# Patient Record
Sex: Female | Born: 1935 | Race: White | Hispanic: No | Marital: Married | Smoking: Former smoker
Health system: Southern US, Community
[De-identification: ages and names within clinical notes are randomized; demographics above are authoritative.]

## PROBLEM LIST (undated history)

## (undated) DIAGNOSIS — I872 Venous insufficiency (chronic) (peripheral): Secondary | ICD-10-CM

## (undated) DIAGNOSIS — R112 Nausea with vomiting, unspecified: Secondary | ICD-10-CM

## (undated) DIAGNOSIS — M353 Polymyalgia rheumatica: Secondary | ICD-10-CM

## (undated) DIAGNOSIS — E119 Type 2 diabetes mellitus without complications: Secondary | ICD-10-CM

## (undated) DIAGNOSIS — H3530 Unspecified macular degeneration: Secondary | ICD-10-CM

## (undated) DIAGNOSIS — E785 Hyperlipidemia, unspecified: Secondary | ICD-10-CM

## (undated) DIAGNOSIS — Z9889 Other specified postprocedural states: Secondary | ICD-10-CM

## (undated) DIAGNOSIS — L97919 Non-pressure chronic ulcer of unspecified part of right lower leg with unspecified severity: Secondary | ICD-10-CM

## (undated) DIAGNOSIS — D496 Neoplasm of unspecified behavior of brain: Secondary | ICD-10-CM

## (undated) DIAGNOSIS — I10 Essential (primary) hypertension: Secondary | ICD-10-CM

## (undated) HISTORY — PX: CERVICAL BIOPSY  W/ LOOP ELECTRODE EXCISION: SUR135

## (undated) HISTORY — DX: Hyperlipidemia, unspecified: E78.5

## (undated) HISTORY — PX: CATARACT EXTRACTION W/ INTRAOCULAR LENS IMPLANT: SHX1309

## (undated) HISTORY — PX: EYE SURGERY: SHX253

## (undated) HISTORY — PX: TONSILLECTOMY: SUR1361

## (undated) HISTORY — DX: Polymyalgia rheumatica: M35.3

## (undated) HISTORY — PX: OTHER SURGICAL HISTORY: SHX169

---

## 1973-07-18 HISTORY — PX: FRACTURE SURGERY: SHX138

## 1998-04-30 ENCOUNTER — Ambulatory Visit (HOSPITAL_COMMUNITY): Admission: RE | Admit: 1998-04-30 | Discharge: 1998-04-30 | Payer: Self-pay | Admitting: Gynecology

## 1999-04-14 ENCOUNTER — Ambulatory Visit (HOSPITAL_COMMUNITY): Admission: RE | Admit: 1999-04-14 | Discharge: 1999-04-14 | Payer: Self-pay | Admitting: Family Medicine

## 1999-04-14 ENCOUNTER — Encounter: Payer: Self-pay | Admitting: Family Medicine

## 2000-01-20 ENCOUNTER — Other Ambulatory Visit: Admission: RE | Admit: 2000-01-20 | Discharge: 2000-01-20 | Payer: Self-pay | Admitting: Gynecology

## 2000-01-20 ENCOUNTER — Encounter (INDEPENDENT_AMBULATORY_CARE_PROVIDER_SITE_OTHER): Payer: Self-pay

## 2000-05-01 ENCOUNTER — Other Ambulatory Visit: Admission: RE | Admit: 2000-05-01 | Discharge: 2000-05-01 | Payer: Self-pay | Admitting: Gynecology

## 2000-12-12 ENCOUNTER — Other Ambulatory Visit: Admission: RE | Admit: 2000-12-12 | Discharge: 2000-12-12 | Payer: Self-pay | Admitting: Gynecology

## 2001-01-17 ENCOUNTER — Ambulatory Visit (HOSPITAL_COMMUNITY): Admission: RE | Admit: 2001-01-17 | Discharge: 2001-01-17 | Payer: Self-pay | Admitting: Gynecology

## 2001-01-17 ENCOUNTER — Encounter (INDEPENDENT_AMBULATORY_CARE_PROVIDER_SITE_OTHER): Payer: Self-pay | Admitting: Specialist

## 2001-03-15 ENCOUNTER — Other Ambulatory Visit: Admission: RE | Admit: 2001-03-15 | Discharge: 2001-03-15 | Payer: Self-pay | Admitting: Gynecology

## 2001-07-18 DIAGNOSIS — M353 Polymyalgia rheumatica: Secondary | ICD-10-CM

## 2001-07-18 HISTORY — DX: Polymyalgia rheumatica: M35.3

## 2002-03-20 ENCOUNTER — Encounter: Payer: Self-pay | Admitting: Family Medicine

## 2002-03-20 ENCOUNTER — Ambulatory Visit (HOSPITAL_COMMUNITY): Admission: RE | Admit: 2002-03-20 | Discharge: 2002-03-20 | Payer: Self-pay | Admitting: Family Medicine

## 2002-03-25 ENCOUNTER — Other Ambulatory Visit: Admission: RE | Admit: 2002-03-25 | Discharge: 2002-03-25 | Payer: Self-pay | Admitting: Gynecology

## 2002-05-07 ENCOUNTER — Encounter: Payer: Self-pay | Admitting: Family Medicine

## 2002-05-07 ENCOUNTER — Ambulatory Visit (HOSPITAL_COMMUNITY): Admission: RE | Admit: 2002-05-07 | Discharge: 2002-05-07 | Payer: Self-pay | Admitting: Family Medicine

## 2002-11-27 ENCOUNTER — Ambulatory Visit (HOSPITAL_COMMUNITY): Admission: RE | Admit: 2002-11-27 | Discharge: 2002-11-27 | Payer: Self-pay

## 2003-05-05 ENCOUNTER — Encounter: Admission: RE | Admit: 2003-05-05 | Discharge: 2003-08-03 | Payer: Self-pay | Admitting: Family Medicine

## 2003-12-22 ENCOUNTER — Other Ambulatory Visit: Admission: RE | Admit: 2003-12-22 | Discharge: 2003-12-22 | Payer: Self-pay | Admitting: Gynecology

## 2004-04-07 ENCOUNTER — Encounter: Admission: RE | Admit: 2004-04-07 | Discharge: 2004-04-07 | Payer: Self-pay | Admitting: Family Medicine

## 2004-06-28 ENCOUNTER — Other Ambulatory Visit: Admission: RE | Admit: 2004-06-28 | Discharge: 2004-06-28 | Payer: Self-pay | Admitting: Gynecology

## 2005-01-24 ENCOUNTER — Other Ambulatory Visit: Admission: RE | Admit: 2005-01-24 | Discharge: 2005-01-24 | Payer: Self-pay | Admitting: Gynecology

## 2006-10-23 ENCOUNTER — Encounter (INDEPENDENT_AMBULATORY_CARE_PROVIDER_SITE_OTHER): Payer: Self-pay | Admitting: *Deleted

## 2006-10-23 ENCOUNTER — Ambulatory Visit (HOSPITAL_BASED_OUTPATIENT_CLINIC_OR_DEPARTMENT_OTHER): Admission: RE | Admit: 2006-10-23 | Discharge: 2006-10-23 | Payer: Self-pay | Admitting: Urology

## 2007-08-23 ENCOUNTER — Other Ambulatory Visit: Admission: RE | Admit: 2007-08-23 | Discharge: 2007-08-23 | Payer: Self-pay | Admitting: Family Medicine

## 2007-11-07 ENCOUNTER — Encounter: Admission: RE | Admit: 2007-11-07 | Discharge: 2007-11-07 | Payer: Self-pay

## 2009-03-27 ENCOUNTER — Encounter: Admission: RE | Admit: 2009-03-27 | Discharge: 2009-03-27 | Payer: Self-pay | Admitting: Family Medicine

## 2009-04-03 ENCOUNTER — Encounter: Admission: RE | Admit: 2009-04-03 | Discharge: 2009-04-03 | Payer: Self-pay | Admitting: Orthopaedic Surgery

## 2010-01-04 ENCOUNTER — Encounter (HOSPITAL_BASED_OUTPATIENT_CLINIC_OR_DEPARTMENT_OTHER): Admission: RE | Admit: 2010-01-04 | Discharge: 2010-04-04 | Payer: Self-pay | Admitting: General Surgery

## 2010-01-05 ENCOUNTER — Ambulatory Visit (HOSPITAL_COMMUNITY): Admission: RE | Admit: 2010-01-05 | Discharge: 2010-01-05 | Payer: Self-pay | Admitting: General Surgery

## 2010-01-21 ENCOUNTER — Ambulatory Visit: Payer: Self-pay | Admitting: Vascular Surgery

## 2010-04-05 ENCOUNTER — Encounter (HOSPITAL_BASED_OUTPATIENT_CLINIC_OR_DEPARTMENT_OTHER): Admission: RE | Admit: 2010-04-05 | Discharge: 2010-05-25 | Payer: Self-pay | Admitting: General Surgery

## 2010-11-30 NOTE — Procedures (Signed)
DUPLEX DEEP VENOUS EXAM - LOWER EXTREMITY   INDICATION:  Nonhealing wound.   HISTORY:  Edema:  Yes.  Trauma/Surgery:  Yes.  Pain:  No.  PE:  No.  Previous DVT:  No.  Anticoagulants:  No.  Other:   DUPLEX EXAM:                CFV   SFV   PopV  PTV    GSV                R  L  R  L  R  L  R   L  R  L  Thrombosis    0  0  0  0  0  0  0   0  0  0  Spontaneous   +  +  +  +  +  +  +   +  +  +  Phasic        +  +  +  +  +  +  +   +  +  +  Augmentation  +  +  +  +  +  +  +   +  +  +  Compressible  +  +  +  +  +  +  +   +  +  +  Competent     +  +  +  +  +  +  +   +  +  +   Legend:  + - yes  o - no  p - partial  D - decreased   IMPRESSION:  1. Bilateral lower extremities appear to be negative for deep venous      thrombosis.  2. Bilateral lower extremity deep and superficial veins appear to be      negative for venous reflux.    _____________________________  Di Kindle. Edilia Bo, M.D.   NT/MEDQ  D:  01/21/2010  T:  01/21/2010  Job:  [id number]

## 2010-12-03 NOTE — Op Note (Signed)
NAME:  Nancy Glover, Nancy Glover                ACCOUNT NO.:  [id number]   MEDICAL RECORD NO.:  [id number]          PATIENT TYPE:  AMB   LOCATION:  [facility]                         FACILITY:  [facility]   PHYSICIAN:  Maretta Bees. Vonita Moss, M.D.DATE OF BIRTH:  1933/07/20   DATE OF PROCEDURE:  10/23/2006  DATE OF DISCHARGE:                               OPERATIVE REPORT   PREOPERATIVE DIAGNOSIS:  Rule out interstitial cystitis.   POSTOPERATIVE DIAGNOSIS:  Rule out interstitial cystitis.   PROCEDURE:  Cystoscopy, HOD and cold cup bladder biopsy.   SURGEON:  Dr. Larey Dresser.   ANESTHESIA:  General.   INDICATIONS:  This 77 year old lady has had a long history of voiding  symptoms with chronic frequency and urgency and urge incontinence.  She  also has a history of recurrent UTIs despite therapy with Sanctura,  Estrace cream and trimethoprim she still very symptomatic with the  pelvic pain symptom score of 22.  She is brought to the OR today to rule  out interstitial cystitis.   PROCEDURE:  The patient is brought to the operating room, placed in  lithotomy position.  External genitalia were prepped, draped in usual  fashion.  She was cystoscoped.  The bladder was unremarkable with no  stones, tumors or inflammatory lesions.  She then underwent  hydrodistention of the bladder and was filled up to 600 mL and started  leaking around the cystoscope.  Looking back in, she had widespread  scattered submucosal petechiae and hemorrhage consistent with IC.  Cold  cup bladder biopsies were taken across the base and typical hemorrhagic  areas and the biopsy sites were fulgurated with the Bugbee electrode.  Bladder was emptied, scope removed.  The patient sent to recovery room  in good condition having tolerated the procedure well.      Maretta Bees. Vonita Moss, M.D.  Electronically Signed     LJP/MEDQ  D:  10/23/2006  T:  10/23/2006  Job:  [id number]   cc:   Dario Guardian, M.D.  Fax: [phone]

## 2010-12-03 NOTE — Op Note (Signed)
[facility]  Patient:    Nancy Glover, Nancy Glover                         MRN: [id number] Attending:  Gretta Cool, M.D. CC:         Stacie Acres. Cliffton Asters, M.D.   Operative Report  PREOPERATIVE DIAGNOSIS:  Abnormal genital cytology, persistent x 2 years, low-grade.  POSTOPERATIVE DIAGNOSIS:  Abnormal genital cytology, persistent x 2 years, low-grade.  PROCEDURE:  Loop electrosurgical excision procedure (LEEP) cone.  SURGEON:  Gretta Cool, M.D.  ANESTHESIA:  Paracervical block 1% Xylocaine with preoperative Vioxx 25 mg.  DESCRIPTION OF PROCEDURE:  Under excellent anesthesia as above with the patient prepped and draped in lithotomy position, the cervix was first anesthetized with Xylocaine as a paracervical block.  The cervix was then prepped with Lugols iodine to identify possible skip lesions.  There were no skip lesions.  A central core excision of the transformation zone was then undertaken and extended approximately 2 cm down the canal of the cervix.  Once the entire transformation zone extending approximately 2 cm down the canal of the cervix was obtained, the endocervical tissue deep to the cone was then cauterized.  Bleeding was controlled with monopolar cautery.  The procedure was then terminated without complication.  Patient returned to the recovery room in satisfactory condition. DD:  01/17/01 TD:  01/17/01 Job: [id number]

## 2012-01-04 ENCOUNTER — Emergency Department (HOSPITAL_COMMUNITY): Payer: Medicare Other

## 2012-01-04 ENCOUNTER — Encounter (HOSPITAL_COMMUNITY): Payer: Self-pay | Admitting: Emergency Medicine

## 2012-01-04 DIAGNOSIS — S0003XA Contusion of scalp, initial encounter: Secondary | ICD-10-CM | POA: Insufficient documentation

## 2012-01-04 DIAGNOSIS — I10 Essential (primary) hypertension: Secondary | ICD-10-CM | POA: Insufficient documentation

## 2012-01-04 DIAGNOSIS — S0083XA Contusion of other part of head, initial encounter: Secondary | ICD-10-CM | POA: Insufficient documentation

## 2012-01-04 DIAGNOSIS — R071 Chest pain on breathing: Secondary | ICD-10-CM | POA: Insufficient documentation

## 2012-01-04 DIAGNOSIS — W108XXA Fall (on) (from) other stairs and steps, initial encounter: Secondary | ICD-10-CM | POA: Insufficient documentation

## 2012-01-04 DIAGNOSIS — E119 Type 2 diabetes mellitus without complications: Secondary | ICD-10-CM | POA: Insufficient documentation

## 2012-01-04 DIAGNOSIS — S20219A Contusion of unspecified front wall of thorax, initial encounter: Secondary | ICD-10-CM | POA: Insufficient documentation

## 2012-01-04 NOTE — ED Notes (Signed)
PT. LOST HER BALANCED WHILE SWEEPING HER PORCH THIS EVENING , FELL BACK WARDS , NO LOC , AMBULATORY . REPORTS PAIN AT BACK OF HEAD AND RIGHT LATERAL RIBCAGE PAIN , RESPIRATIONS UNLABORED.

## 2012-01-05 ENCOUNTER — Emergency Department (HOSPITAL_COMMUNITY): Payer: Medicare Other

## 2012-01-05 ENCOUNTER — Emergency Department (HOSPITAL_COMMUNITY)
Admission: EM | Admit: 2012-01-05 | Discharge: 2012-01-05 | Disposition: A | Discharge status: Home / Self Care | Payer: Medicare Other | Attending: Emergency Medicine | Admitting: Emergency Medicine

## 2012-01-05 DIAGNOSIS — S20219A Contusion of unspecified front wall of thorax, initial encounter: Secondary | ICD-10-CM

## 2012-01-05 DIAGNOSIS — W19XXXA Unspecified fall, initial encounter: Secondary | ICD-10-CM

## 2012-01-05 DIAGNOSIS — S0003XA Contusion of scalp, initial encounter: Secondary | ICD-10-CM

## 2012-01-05 HISTORY — DX: Essential (primary) hypertension: I10

## 2012-01-05 MED ORDER — OXYCODONE-ACETAMINOPHEN 5-325 MG PO TABS: 1.0000 | ORAL_TABLET | ORAL | Status: AC | PRN

## 2012-01-05 NOTE — Discharge Instructions (Signed)
Take Tylenol or ibuprofen as needed for less severe pain.  Chest Contusion You have been checked for injuries to your chest. Your caregiver has not found injuries serious enough to require hospitalization. It is common to have bruises and sore muscles after an injury. These tend to feel worse the first 24 hours. You may gradually develop more stiffness and soreness over the next several hours to several days. This usually feels worse the first morning following your injury. After a few days, you will usually begin to improve. The amount of improvement depends on the amount of damage. Following the accident, if the pain in any area continues to increase or you develop new areas of pain, you should see your primary caregiver or return to the Emergency Department for re-evaluation. HOME CARE INSTRUCTIONS   Put ice on sore areas every 2 hours for 20 minutes while awake for the next 2 days.   Drink extra fluids. Do not drink alcohol.   Activity as tolerated. Lifting may make pain worse.   Only take over-the-counter or prescription medicines for pain, discomfort, or fever as directed by your caregiver. Do not use aspirin. This may increase bruising or increase bleeding.  SEEK IMMEDIATE MEDICAL CARE IF:   There is a worsening of any of the problems that brought you in for care.   Shortness of breath, dizziness or fainting develop.   You have chest pain, difficulty breathing, or develop pain going down the left arm or up into jaw.   You feel sick to your stomach (nausea), vomiting or sweats.   You have increasing belly (abdominal) discomfort.   There is blood in your urine, stool, or if you vomit blood.   There is pain in either shoulder in an area where a shoulder strap would be.   You have feelings of lightheadedness, or if you should have a fainting episode.   You have numbness, tingling, weakness, or problems with the use of your arms or legs.   Severe headaches not relieved with  medications develop.   You have a change in bowel or bladder control.   There is increasing pain in any areas of the body.  If you feel your symptoms are worsening, and you are not able to see your primary caregiver, return to the Emergency Department immediately. MAKE SURE YOU:   Understand these instructions.   Will watch your condition.   Will get help right away if you are not doing well or get worse.  Document Released: 03/29/2001 Document Revised: 06/23/2011 Document Reviewed: 02/20/2008 [facility] Patient Information 2012 [place], [place].  Home Safety and Preventing Falls Falls are a leading cause of injury and while they affect all age groups, falls have greater short-term and long-term impact on older age groups. However, falls should not be a part of life or aging. It is possible for individuals and their families to use preventive measures to significantly decrease the likelihood that anyone, especially an older adult, will fall. There are many simple measures which can make your home safer with respect to preventing falls. The following actions can help reduce falls among all members of your family and are especially important as you age, when your balance, lower limb strength, coordination, and eyesight may be declining. The use of preventive measures will help to reduce you and your family's risk of falls and serious medical consequences. OUTDOORS  Repair cracks and edges of walkways and driveways.   Remove high doorway thresholds and trim shrubbery on the main path  into your home.   Ensure there is good outside lighting at main entrances and along main walkways.   Clear walkways of tools, rocks, debris, and clutter.   Check that handrails are not broken and are securely fastened. Both sides of steps should have handrails.   In the garage, be attentive to and clean up grease or oil spills on the cement. This can make the surface extremely slippery.   In winter, have  leaves, snow, and ice cleared regularly.   Use sand or salt on walkways during winter months.  BATHROOM  Install grab bars by the toilet and in the tub and shower.   Use non-skid mats or decals in the tub or shower.   If unable to easily stand unsupported while showering, place a plastic non slip stool in the shower to sit on when needed.   Install night lights.   Keep floors dry and clean up all water on the floor immediately.   Remove soap buildup in tub or shower on a regular basis.   Secure bath mats with non-slip, double-sided rug tape.   Remove tripping hazards from the floors.  BEDROOMS  Install night lights.   Do not use oversized bedding.   Make sure a bedside light is easy to reach.   Keep a telephone by your bedside.   Make sure that you can get in and out of your bed easily.   Have a firm chair, with side arms, to use for getting dressed.   Remove clutter from around closets.   Store clothing, bed coverings, and other household items where you can reach them comfortably.   Remove tripping hazards from the floor.  LIVING AREAS AND STAIRWAYS  Turn on lights to avoid having to walk through dark areas.   Keep lighting uniform in each room. Place brighter lightbulbs in darker areas, including stairways.   Replace lightbulbs that burn out in stairways immediately.   Arrange furniture to provide for clear pathways.   Keep furniture in the same place.   Eliminate or tape down electrical cables in high traffic areas.   Place handrails on both sides of stairways. Use handrails when going up or down stairs.   Most falls occur on the top or bottom 3 steps.   Fix any loose handrails. Make sure handrails on both sides of the stairways are as long as the stairs.   Remove all walkway obstacles.   Coil or tape electrical cords off to the side of walking areas and out of the way. If using many extension cords, have an electrician put in a new wall outlet to  reduce or eliminate them.   Make sure spills are cleaned up quickly and allow time for drying before walking on freshly cleaned floors.   Firmly attach carpet with non-skid or two-sided tape.   Keep frequently used items within easy reach.   Remove tripping hazards such as throw rugs and clutter in walkways. Never leave objects on stairs.   Get rid of throw rugs elsewhere if possible.   Eliminate uneven floor surfaces.   Make sure couches and chairs are easy to get into and out of.   Check carpeting to make sure it is firmly attached along stairs.   Make repairs to worn or loose carpet promptly.   Select a carpet pattern that does not visually hide the edge of steps.   Avoid placing throw rugs or scatter rugs at the top or bottom of stairways, or properly secure  with carpet tape to prevent slippage.   Have an electrician put in a light switch at the top and bottom of the stairs.   Get light switches that glow.   Avoid the following practices: hurrying, inattention, obscured vision, carrying large loads, and wearing slip-on shoes.   Be aware of all pets.  KITCHEN  Place items that are used frequently, such as dishes and food, within easy reach.   Keep handles on pots and pans toward the center of the stove. Use back burners when possible.   Make sure spills are cleaned up quickly and allow time for drying.   Avoid walking on wet floors.   Avoid hot utensils and knives.   Position shelves so they are not too high or low.   Place commonly used objects within easy reach.   If necessary, use a sturdy step stool with a grab bar when reaching.   Make sure electrical cables are out of the way.   Do not use floor polish or wax that makes floors slippery.  OTHER HOME FALL PREVENTION STRATEGIES  Wear low heel or rubber sole shoes that are supportive and fit well.   Wear closed toe shoes.   Know and watch for side effects of medications. Have your caregiver or pharmacist  look at all your medicines, even over-the-counter medicines. Some medicines can make you sleepy or dizzy.   Exercise regularly. Exercise makes you stronger and improves your balance and coordination.   Limit use of alcohol.   Use eyeglasses if necessary and keep them clean. Have your vision checked every year.   Organize your household in a manner that minimizes the need to walk distances when hurried, or go up and down stairs unnecessarily. For example, have a phone placed on at least each floor of your home. If possible, have a phone beside each sitting or lying area where you spend the most time at home. Keep emergency numbers posted at all phones.   Use non-skid floor wax.   When using a ladder, make sure:   The base is firm.   All ladder feet are on level ground.   The ladder is angled against the wall properly.   When climbing a ladder, face the ladder and hold the ladder rungs firmly.   If reaching, always keep your hips and body weight centered between the rails.   When using a stepladder, make sure it is fully opened and both spreaders are firmly locked.   Do not climb a closed stepladder.   Avoid climbing beyond the second step from the top of a stepladder and the 4th rung from the top of an extension ladder.   Learn and use mobility aids as needed.   Change positions slowly. Arise slowly from sitting and lying positions. Sit on the edge of your bed before getting to your feet.   If you have a history of falls, ask someone to add color or contrast paint or tape to grab bars and handrails in your home.   If you have a history of falls, ask someone to place contrasting color strips on first and last steps.   Install an electrical emergency response system if you need one, and know how to use it.   If you have a medical or other condition that causes you to have limited physical strength, it is important that you reach out to family and friends for occasional help.    FOR CHILDREN:  If young children are in the  home, use safety gates. At the top of stairs use screw-mounted gates; use pressure-mounted gates for the bottom of the stairs and doorways between rooms.   Young children should be taught to descend stairs on their stomachs, feet first, and later using the handrail.   Keep drawers fully closed to prevent them from being climbed on or pulled out entirely.   Move chairs, cribs, beds and other furniture away from windows.   Consider installing window guards on windows ground floor and up, unless they are emergency fire exits. Make sure they have easy release mechanisms.   Consider installing special locks that only allow the window to be opened to a certain height.   Never rely on window screens to prevent falls.   Never leave babies alone on changing tables, beds or sofas. Use a changing table that has a restraining strap.   When a child can pull to a standing position, the crib mattress should be adjusted to its lowest position. There should be at least 26 inches between the top rails of the crib drop side and the mattress. Toys, bumper pads, and other objects that can be used as steps to climb out should be removed from the crib.   On bunk beds never allow a child under age 27 to sleep on the top bunk. For older children, if the upper bunk is not against a wall, use guard rails on both sides. No matter how old a child is, keep the guard rails in place on the top bunk since children roll during sleep. Do not permit horseplay on bunks.   Grass and soil surfaces beneath backyard playground equipment should be replaced with hardwood chips, shredded wood mulch, sand, pea gravel, rubber, crushed stone, or another safer material at depths of at least 9 to 12 inches.   When riding bikes or using skates, skateboards, skis, or snowboards, require children to wear helmets. Look for those that have stickers stating that they meet or exceed safety standards.    Vertical posts or pickets in deck, balcony, and stairway railings should be no more than 3 1/2 inches apart if a young baby will have access to the area. The space between horizontal rails or bars, and between the floor and the first horizontal rail or bar, should be no more than 3 1/2 inches.  Document Released: 06/24/2002 Document Revised: 06/23/2011 Document Reviewed: 04/23/2009 [facility] Patient Information 2012 [place], [place].  Acetaminophen; Oxycodone tablets What is this medicine? ACETAMINOPHEN; OXYCODONE (a set a MEE noe fen; ox i KOE done) is a pain reliever. It is used to treat mild to moderate pain. This medicine may be used for other purposes; ask your health care provider or pharmacist if you have questions. What should I tell my health care provider before I take this medicine? They need to know if you have any of these conditions: -brain tumor -Crohn's disease, inflammatory bowel disease, or ulcerative colitis -drink more than 3 alcohol containing drinks per day -drug abuse or addiction -head injury -heart or circulation problems -kidney disease or problems going to the bathroom -liver disease -lung disease, asthma, or breathing problems -an unusual or allergic reaction to acetaminophen, oxycodone, other opioid analgesics, other medicines, foods, dyes, or preservatives -pregnant or trying to get pregnant -breast-feeding How should I use this medicine? Take this medicine by mouth with a full glass of water. Follow the directions on the prescription label. Take your medicine at regular intervals. Do not take your medicine more often than directed.  Talk to your pediatrician regarding the use of this medicine in children. Special care may be needed. Patients over 39 years old may have a stronger reaction and need a smaller dose. Overdosage: If you think you have taken too much of this medicine contact a poison control center or emergency room at once. NOTE: This medicine is  only for you. Do not share this medicine with others. What if I miss a dose? If you miss a dose, take it as soon as you can. If it is almost time for your next dose, take only that dose. Do not take double or extra doses. What may interact with this medicine? -alcohol or medicines that contain alcohol -antihistamines -barbiturates like amobarbital, butalbital, butabarbital, methohexital, pentobarbital, phenobarbital, thiopental, and secobarbital -benztropine -drugs for bladder problems like solifenacin, trospium, oxybutynin, tolterodine, hyoscyamine, and methscopolamine -drugs for breathing problems like ipratropium and tiotropium -drugs for certain stomach or intestine problems like propantheline, homatropine methylbromide, glycopyrrolate, atropine, belladonna, and dicyclomine -general anesthetics like etomidate, ketamine, nitrous oxide, propofol, desflurane, enflurane, halothane, isoflurane, and sevoflurane -medicines for depression, anxiety, or psychotic disturbances -medicines for pain like codeine, morphine, pentazocine, buprenorphine, butorphanol, nalbuphine, tramadol, and propoxyphene -medicines for sleep -muscle relaxants -naltrexone -phenothiazines like perphenazine, thioridazine, chlorpromazine, mesoridazine, fluphenazine, prochlorperazine, promazine, and trifluoperazine -scopolamine -trihexyphenidyl This list may not describe all possible interactions. Give your health care provider a list of all the medicines, herbs, non-prescription drugs, or dietary supplements you use. Also tell them if you smoke, drink alcohol, or use illegal drugs. Some items may interact with your medicine. What should I watch for while using this medicine? Tell your doctor or health care professional if your pain does not go away, if it gets worse, or if you have new or a different type of pain. You may develop tolerance to the medicine. Tolerance means that you will need a higher dose of the medication for  pain relief. Tolerance is normal and is expected if you take this medicine for a long time. Do not suddenly stop taking your medicine because you may develop a severe reaction. Your body becomes used to the medicine. This does NOT mean you are addicted. Addiction is a behavior related to getting and using a drug for a nonmedical reason. If you have pain, you have a medical reason to take pain medicine. Your doctor will tell you how much medicine to take. If your doctor wants you to stop the medicine, the dose will be slowly lowered over time to avoid any side effects. You may get drowsy or dizzy. Do not drive, use machinery, or do anything that needs mental alertness until you know how this medicine affects you. Do not stand or sit up quickly, especially if you are an older patient. This reduces the risk of dizzy or fainting spells. Alcohol may interfere with the effect of this medicine. Avoid alcoholic drinks. The medicine will cause constipation. Try to have a bowel movement at least every 2 to 3 days. If you do not have a bowel movement for 3 days, call your doctor or health care professional. Do not take Tylenol (acetaminophen) or medicines that have acetaminophen with this medicine. Too much acetaminophen can be very dangerous. Many nonprescription medicines contain acetaminophen. Always read the labels carefully to avoid taking more acetaminophen. What side effects may I notice from receiving this medicine? Side effects that you should report to your doctor or health care professional as soon as possible: -allergic reactions like skin rash, itching or hives, swelling of  the face, lips, or tongue -breathing difficulties, wheezing -confusion -light headedness or fainting spells -severe stomach pain -yellowing of the skin or the whites of the eyes Side effects that usually do not require medical attention (report to your doctor or health care professional if they continue or are  bothersome): -dizziness -drowsiness -nausea -vomiting This list may not describe all possible side effects. Call your doctor for medical advice about side effects. You may report side effects to FDA at 1-800-FDA-1088. Where should I keep my medicine? Keep out of the reach of children. This medicine can be abused. Keep your medicine in a safe place to protect it from theft. Do not share this medicine with anyone. Selling or giving away this medicine is dangerous and against the law. Store at room temperature between 20 and 25 degrees C (68 and 77 degrees F). Keep container tightly closed. Protect from light. Flush any unused medicines down the toilet. Do not use the medicine after the expiration date. NOTE: This sheet is a summary. It may not cover all possible information. If you have questions about this medicine, talk to your doctor, pharmacist, or health care provider.  2012, [employer]. (06/02/2008 10:01:21 AM)

## 2012-01-05 NOTE — ED Notes (Signed)
Patient has returned from radiology.  

## 2012-01-05 NOTE — ED Provider Notes (Signed)
History     CSN: [id number]  Arrival date & time 01/04/12  2041   First MD Initiated Contact with Patient 01/05/12 0136      Chief Complaint  Patient presents with  . Fall    (Consider location/radiation/quality/duration/timing/severity/associated sxs/prior treatment) Patient is a 78 y.o. female presenting with fall. The history is provided by the patient.  Fall  She fell down some steps and hit her head on concrete. She also hit her right chest wall. She's complaining of pain in her right chest wall and also is complaining of a knot on the back of her head. She says she saw stars but did not lose consciousness. She denies vision disturbance, nausea, vomiting, dizziness, incoordination. Pain is moderately severe and she rates it at 7/10. She denies neck, back, abdomen, extremity injury.  Past Medical History  Diagnosis Date  . Hypertension   . Diabetes mellitus     History reviewed. No pertinent past surgical history.  No family history on file.  History  Substance Use Topics  . Smoking status: Never Smoker   . Smokeless tobacco: Not on file  . Alcohol Use: No    OB History    Grav Para Term Preterm Abortions TAB SAB Ect Mult Living                  Review of Systems  All other systems reviewed and are negative.    Allergies  Review of patient's allergies indicates not on file.  Home Medications  No current outpatient prescriptions on file.  BP 131/80  Pulse 97  Temp 98.1 F (36.7 C) (Oral)  Resp 20  SpO2 94%  Physical Exam  Nursing note and vitals reviewed.  78 year old female who is resting comfortably and in no acute distress. Vital signs are significant for a systolic hypertension with blood pressure 167/73. Oxygen saturation is 96% which is normal. Head is normocephalic with a 2 cm hematoma over the occiput. This area is moderately tender.Marland Kitchen PERRLA, EOMI. TMs are clear without CSF otorrhea or hemotympanum. Neck has mild midline tenderness. Back is  nontender. Lungs are clear without rales, wheezes, or rhonchi. There is moderate right-sided chest wall tenderness without any deformity. Heart has regular rate and rhythm without murmur. Abdomen is soft, flat, nontender without masses or hepatosplenomegaly. Extremities have full range of motion, no cyanosis. There is 1-2+ pretibial edema and moderate venous stasis changes are present. Skin is warm and dry without other rash. Neurologic: Mental status is normal, cranial nerves are intact, there are no motor or sensory deficits.  ED Course  Procedures (including critical care time)  Dg Ribs Unilateral W/chest Right  01/04/2012  *RADIOLOGY REPORT*  Clinical Data: Right lower rib pain following a fall tonight.  Ex- smoker.  RIGHT RIBS AND CHEST - 3+ VIEW  Comparison: Chest dated 01/05/2010.  Findings: The cardiac silhouette remains borderline enlarged. Multiple old, healed right rib fractures.  No acute fracture visualized and no pneumothorax seen.  Stable prominence of the interstitial markings.  IMPRESSION:  1.  No acute fracture or pneumothorax. 2.  Multiple old, healed right rib fractures. 3.  Stable borderline cardiomegaly and mild changes of COPD.  Original Report Authenticated By: Darrol Angel, M.D.   Ct Head Wo Contrast  01/05/2012  *RADIOLOGY REPORT*  Clinical Data:  Status post fall; loss of balance.  Hit back of head.  Concern for cervical spine injury.  CT HEAD WITHOUT CONTRAST AND CT CERVICAL SPINE WITHOUT CONTRAST  Technique:  Multidetector  CT imaging of the head and cervical spine was performed following the standard protocol without intravenous contrast.  Multiplanar CT image reconstructions of the cervical spine were also generated.  Comparison: None  CT HEAD  Findings: There is no evidence of acute infarction, mass lesion, or intra- or extra-axial hemorrhage on CT.  Scattered periventricular and subcortical white matter change likely reflects small vessel ischemic microangiopathy.  Chronic  ischemic change is noted at the basal ganglia bilaterally.  The posterior fossa, including the cerebellum, brainstem and fourth ventricle, is within normal limits.  The third and lateral ventricles are unremarkable in appearance.  The cerebral hemispheres demonstrate grossly normal gray-white differentiation. No mass effect or midline shift is seen.  There is no evidence of fracture; visualized osseous structures are unremarkable in appearance.  The orbits are within normal limits. A small 9 mm mucus retention cyst or polyp is noted within the left ethmoid air cells.  The paranasal sinuses and mastoid air cells are well-aerated.  Soft tissue swelling is noted at the posterior vertex.  IMPRESSION:  1.  No evidence of traumatic intracranial injury or fracture. 2.  Soft tissue swelling at the posterior vertex. 3.  Scattered small vessel ischemic microangiopathy, with chronic ischemic change at the basal ganglia bilaterally. 4.  9 mm mucus retention cyst or polyp within the left ethmoid air cells.  CT CERVICAL SPINE  Findings: There is no evidence of fracture or subluxation. Vertebral bodies demonstrate normal height and alignment.  There is narrowing of the intervertebral disc space at C5-C6, with associated anterior and posterior disc osteophyte complexes. Prevertebral soft tissues are within normal limits.  A small hypodensity at the left thyroid lobe likely remains within normal limits, given its size.  The visualized lung apices are clear.  Scattered calcification is noted at the carotid bifurcations bilaterally.  IMPRESSION:  1.  No evidence of fracture or subluxation along the cervical spine. 2.  Mild degenerative change at the lower cervical spine. 3.  Scattered calcification at the carotid bifurcations bilaterally.  Carotid ultrasound would be helpful for further evaluation on an elective non-emergent basis, when and as deemed clinically appropriate.  Original Report Authenticated By: Tonia Ghent, M.D.   Ct  Cervical Spine Wo Contrast  01/05/2012  *RADIOLOGY REPORT*  Clinical Data:  Status post fall; loss of balance.  Hit back of head.  Concern for cervical spine injury.  CT HEAD WITHOUT CONTRAST AND CT CERVICAL SPINE WITHOUT CONTRAST  Technique:  Multidetector CT imaging of the head and cervical spine was performed following the standard protocol without intravenous contrast.  Multiplanar CT image reconstructions of the cervical spine were also generated.  Comparison: None  CT HEAD  Findings: There is no evidence of acute infarction, mass lesion, or intra- or extra-axial hemorrhage on CT.  Scattered periventricular and subcortical white matter change likely reflects small vessel ischemic microangiopathy.  Chronic ischemic change is noted at the basal ganglia bilaterally.  The posterior fossa, including the cerebellum, brainstem and fourth ventricle, is within normal limits.  The third and lateral ventricles are unremarkable in appearance.  The cerebral hemispheres demonstrate grossly normal gray-white differentiation. No mass effect or midline shift is seen.  There is no evidence of fracture; visualized osseous structures are unremarkable in appearance.  The orbits are within normal limits. A small 9 mm mucus retention cyst or polyp is noted within the left ethmoid air cells.  The paranasal sinuses and mastoid air cells are well-aerated.  Soft tissue swelling is noted at the  posterior vertex.  IMPRESSION:  1.  No evidence of traumatic intracranial injury or fracture. 2.  Soft tissue swelling at the posterior vertex. 3.  Scattered small vessel ischemic microangiopathy, with chronic ischemic change at the basal ganglia bilaterally. 4.  9 mm mucus retention cyst or polyp within the left ethmoid air cells.  CT CERVICAL SPINE  Findings: There is no evidence of fracture or subluxation. Vertebral bodies demonstrate normal height and alignment.  There is narrowing of the intervertebral disc space at C5-C6, with associated  anterior and posterior disc osteophyte complexes. Prevertebral soft tissues are within normal limits.  A small hypodensity at the left thyroid lobe likely remains within normal limits, given its size.  The visualized lung apices are clear.  Scattered calcification is noted at the carotid bifurcations bilaterally.  IMPRESSION:  1.  No evidence of fracture or subluxation along the cervical spine. 2.  Mild degenerative change at the lower cervical spine. 3.  Scattered calcification at the carotid bifurcations bilaterally.  Carotid ultrasound would be helpful for further evaluation on an elective non-emergent basis, when and as deemed clinically appropriate.  Original Report Authenticated By: Tonia Ghent, M.D.     1. Fall   2. Chest wall contusion   3. Scalp contusion       MDM  Fall with head injury and chest injury. CT is ordered of head and cervical spine and rib x-rays were obtained at triage. Rib x-rays are reported to show old fractures but no acute fracture.   CTs scans no evidence of intracranial injury. She will be discharged with a prescription for Percocet for pain     Dione Booze, MD 01/05/12 [phone]

## 2012-02-29 ENCOUNTER — Other Ambulatory Visit: Payer: Self-pay | Admitting: Family Medicine

## 2012-02-29 DIAGNOSIS — Z1231 Encounter for screening mammogram for malignant neoplasm of breast: Secondary | ICD-10-CM

## 2012-03-15 ENCOUNTER — Ambulatory Visit
Admission: RE | Admit: 2012-03-15 | Discharge: 2012-03-15 | Disposition: A | Discharge status: Home / Self Care | Payer: Medicare Other | Source: Ambulatory Visit | Attending: Family Medicine | Admitting: Family Medicine

## 2012-03-15 DIAGNOSIS — Z1231 Encounter for screening mammogram for malignant neoplasm of breast: Secondary | ICD-10-CM

## 2012-07-10 ENCOUNTER — Emergency Department: Payer: Self-pay | Admitting: Emergency Medicine

## 2012-07-19 ENCOUNTER — Emergency Department: Payer: Self-pay | Admitting: Emergency Medicine

## 2012-08-01 ENCOUNTER — Encounter (HOSPITAL_BASED_OUTPATIENT_CLINIC_OR_DEPARTMENT_OTHER): Payer: Medicare Other | Attending: General Surgery

## 2012-08-01 DIAGNOSIS — E1169 Type 2 diabetes mellitus with other specified complication: Secondary | ICD-10-CM | POA: Insufficient documentation

## 2012-08-01 DIAGNOSIS — M353 Polymyalgia rheumatica: Secondary | ICD-10-CM | POA: Insufficient documentation

## 2012-08-01 DIAGNOSIS — L97809 Non-pressure chronic ulcer of other part of unspecified lower leg with unspecified severity: Secondary | ICD-10-CM | POA: Insufficient documentation

## 2012-08-01 DIAGNOSIS — Z79899 Other long term (current) drug therapy: Secondary | ICD-10-CM | POA: Insufficient documentation

## 2012-08-01 DIAGNOSIS — I10 Essential (primary) hypertension: Secondary | ICD-10-CM | POA: Insufficient documentation

## 2012-08-01 DIAGNOSIS — Z794 Long term (current) use of insulin: Secondary | ICD-10-CM | POA: Insufficient documentation

## 2012-08-02 NOTE — Progress Notes (Signed)
[facility]  NAME:  Nancy Glover, Nancy Glover NO.:  [id number]  MEDICAL RECORD NO.:  [id number]      DATE OF BIRTH:  April 28, 1934  PHYSICIAN:  Ardath Sax, M.D.           VISIT DATE:                                  OFFICE VISIT   This is a 79 year old, Caucasian lady who enters because she suffered trauma to the anterior aspect of her right leg on July 10, 2012. She went to the emergency room and they sutured it.  Later, the sutures were removed and it was obvious that the flap was ischemic.  So she comes to [place] today and on the anterior aspect of her right leg, she has a 2 x 4 area of dead skin that is simply trimmed away leaving her with a defect going down into viable fat tissue.  She has a history of type 2 diabetes, and calling this a traumatic wound with complications of diabetes, so I will call it a diabetic ulcer, Wagner 3 on the anterior aspect of her right leg.  Her vital signs are all normal, including a blood pressure 120/80, pulse 69, temperature 98.6.  She has type 2 diabetes and hypertension, and also has polymyalgia rheumatica and is on 10 mg of prednisone a day.  She is going to discuss with her doctor to slowly take her off the prednisone as she does not think it is helping. She is also on amitriptyline, calcium and magnesium and vitamins.  She takes metformin for her diabetes, 1000 mg twice a day.  She is also on simvastatin.  After I debrided this, I decided to put her on Santyl daily.  I gave her prescription for Santyl.  She will come back here in a week.  She is going to elevate her leg and her diagnosis is Wagner 3 diabetic ulcer, complicated with trauma to the anterior aspect of her right leg.  Other diagnoses are hypertension and also type 2 diabetes.     Ardath Sax, M.D.     PP/MEDQ  D:  08/01/2012  T:  08/02/2012  Job:  [id number]

## 2012-08-22 ENCOUNTER — Encounter (HOSPITAL_BASED_OUTPATIENT_CLINIC_OR_DEPARTMENT_OTHER): Payer: Medicare Other | Attending: General Surgery

## 2012-08-22 DIAGNOSIS — E1169 Type 2 diabetes mellitus with other specified complication: Secondary | ICD-10-CM | POA: Insufficient documentation

## 2012-08-22 DIAGNOSIS — L97809 Non-pressure chronic ulcer of other part of unspecified lower leg with unspecified severity: Secondary | ICD-10-CM | POA: Insufficient documentation

## 2012-08-22 DIAGNOSIS — I87309 Chronic venous hypertension (idiopathic) without complications of unspecified lower extremity: Secondary | ICD-10-CM | POA: Insufficient documentation

## 2012-08-23 ENCOUNTER — Ambulatory Visit (HOSPITAL_COMMUNITY)
Admission: RE | Admit: 2012-08-23 | Discharge: 2012-08-23 | Disposition: A | Discharge status: Home / Self Care | Payer: Medicare Other | Source: Ambulatory Visit | Attending: General Surgery | Admitting: General Surgery

## 2012-08-23 ENCOUNTER — Other Ambulatory Visit (HOSPITAL_BASED_OUTPATIENT_CLINIC_OR_DEPARTMENT_OTHER): Payer: Self-pay | Admitting: General Surgery

## 2012-08-23 DIAGNOSIS — I700 Atherosclerosis of aorta: Secondary | ICD-10-CM | POA: Insufficient documentation

## 2012-08-23 DIAGNOSIS — Z01818 Encounter for other preprocedural examination: Secondary | ICD-10-CM | POA: Insufficient documentation

## 2012-08-23 DIAGNOSIS — Z87891 Personal history of nicotine dependence: Secondary | ICD-10-CM | POA: Insufficient documentation

## 2012-08-23 DIAGNOSIS — X58XXXA Exposure to other specified factors, initial encounter: Secondary | ICD-10-CM | POA: Insufficient documentation

## 2012-08-23 DIAGNOSIS — T148XXA Other injury of unspecified body region, initial encounter: Secondary | ICD-10-CM | POA: Insufficient documentation

## 2012-08-23 DIAGNOSIS — I10 Essential (primary) hypertension: Secondary | ICD-10-CM | POA: Insufficient documentation

## 2012-08-23 DIAGNOSIS — I77819 Aortic ectasia, unspecified site: Secondary | ICD-10-CM | POA: Insufficient documentation

## 2012-08-28 NOTE — Progress Notes (Signed)
[facility]  NAME:  Nancy Glover, Nancy Glover                     ACCOUNT NO.:  MEDICAL RECORD NO.:  [id number]      DATE OF BIRTH:  1934/04/11  PHYSICIAN:  Wayland Denis, DO       VISIT DATE:  08/27/2012                                  OFFICE VISIT   The patient is a 79 year old female who is here for followup on her right lower extremity leg, venous hypertension, ulcer and diabetes as well.  She is seen Dr. Jimmey Ralph and to came for checkup.  Her cultures have come back positive for Staph and Pseudomonas, both are sensitive to Cipro.  So, we will start her on that, she has a lot of fibrous tissue and exudate.  I agree with continuing with the Santyl, but do recommend an OR visit for sharp debridement and ACell placement with the VAC.  She is, otherwise, no change in her family or social history.  On exam, she is alert, oriented, and cooperative, not in any acute distress.  She is very pleasant.  Pupils are equal.  Extraocular muscles are intact.  No cervical lymphadenopathy.  Her breathing is unlabored. Her heart is regular.  Her abdomen is soft.  The wound as described above.  She has some swelling and redness in the area and we will get the wound debrided and I think that will help as well.  She has also started hyperbaric oxygen treatment.     Wayland Denis, DO     CS/MEDQ  D:  08/27/2012  T:  08/28/2012  Job:  [id number]

## 2012-08-29 ENCOUNTER — Encounter: Payer: Self-pay | Admitting: Vascular Surgery

## 2012-08-31 ENCOUNTER — Ambulatory Visit (INDEPENDENT_AMBULATORY_CARE_PROVIDER_SITE_OTHER): Payer: Medicare Other | Admitting: Vascular Surgery

## 2012-08-31 DIAGNOSIS — L97909 Non-pressure chronic ulcer of unspecified part of unspecified lower leg with unspecified severity: Secondary | ICD-10-CM

## 2012-08-31 DIAGNOSIS — R609 Edema, unspecified: Secondary | ICD-10-CM

## 2012-08-31 DIAGNOSIS — L97912 Non-pressure chronic ulcer of unspecified part of right lower leg with fat layer exposed: Secondary | ICD-10-CM

## 2012-09-03 LAB — GLUCOSE, CAPILLARY: Glucose-Capillary: 261 mg/dL — ABNORMAL HIGH (ref 70–99)

## 2012-09-06 LAB — GLUCOSE, CAPILLARY
Glucose-Capillary: 172 mg/dL — ABNORMAL HIGH (ref 70–99)
Glucose-Capillary: 156 mg/dL — ABNORMAL HIGH (ref 70–99)

## 2012-09-07 LAB — GLUCOSE, CAPILLARY
Glucose-Capillary: 180 mg/dL — ABNORMAL HIGH (ref 70–99)
Glucose-Capillary: 162 mg/dL — ABNORMAL HIGH (ref 70–99)

## 2012-09-10 LAB — GLUCOSE, CAPILLARY: Glucose-Capillary: 176 mg/dL — ABNORMAL HIGH (ref 70–99)

## 2012-09-12 ENCOUNTER — Encounter (HOSPITAL_BASED_OUTPATIENT_CLINIC_OR_DEPARTMENT_OTHER): Payer: Self-pay | Admitting: *Deleted

## 2012-09-12 LAB — GLUCOSE, CAPILLARY: Glucose-Capillary: 208 mg/dL — ABNORMAL HIGH (ref 70–99)

## 2012-09-12 NOTE — Progress Notes (Signed)
NPO AFTER MN WITH EXCEPTION CLEAR LIQUIDS UNTIL 0730 (NO CREAM/ MILK PRODUCTS). ARRIVES AT 1200. NEEDS ISTAT AND EKG. WILL TAKE NORVASC, PRILOSEC, AND PREDNISONE AM OF SURG W/ SIP OF WATER.  PRE-OP ORDERS PENDING.

## 2012-09-13 LAB — GLUCOSE, CAPILLARY: Glucose-Capillary: 146 mg/dL — ABNORMAL HIGH (ref 70–99)

## 2012-09-14 ENCOUNTER — Encounter (HOSPITAL_COMMUNITY): Payer: Self-pay | Admitting: *Deleted

## 2012-09-14 ENCOUNTER — Inpatient Hospital Stay (HOSPITAL_COMMUNITY)
Admission: EM | Admit: 2012-09-14 | Discharge: 2012-09-17 | DRG: 812 | Disposition: A | Discharge status: Home / Self Care | Payer: Medicare Other | Attending: Internal Medicine | Admitting: Internal Medicine

## 2012-09-14 DIAGNOSIS — K449 Diaphragmatic hernia without obstruction or gangrene: Secondary | ICD-10-CM | POA: Diagnosis present

## 2012-09-14 DIAGNOSIS — L97909 Non-pressure chronic ulcer of unspecified part of unspecified lower leg with unspecified severity: Secondary | ICD-10-CM

## 2012-09-14 DIAGNOSIS — Z79899 Other long term (current) drug therapy: Secondary | ICD-10-CM

## 2012-09-14 DIAGNOSIS — D509 Iron deficiency anemia, unspecified: Principal | ICD-10-CM | POA: Diagnosis present

## 2012-09-14 DIAGNOSIS — D649 Anemia, unspecified: Secondary | ICD-10-CM

## 2012-09-14 DIAGNOSIS — M316 Other giant cell arteritis: Secondary | ICD-10-CM | POA: Diagnosis present

## 2012-09-14 DIAGNOSIS — L97809 Non-pressure chronic ulcer of other part of unspecified lower leg with unspecified severity: Secondary | ICD-10-CM | POA: Diagnosis present

## 2012-09-14 DIAGNOSIS — E119 Type 2 diabetes mellitus without complications: Secondary | ICD-10-CM | POA: Diagnosis present

## 2012-09-14 DIAGNOSIS — R195 Other fecal abnormalities: Secondary | ICD-10-CM | POA: Diagnosis present

## 2012-09-14 DIAGNOSIS — L97919 Non-pressure chronic ulcer of unspecified part of right lower leg with unspecified severity: Secondary | ICD-10-CM | POA: Diagnosis present

## 2012-09-14 DIAGNOSIS — D519 Vitamin B12 deficiency anemia, unspecified: Secondary | ICD-10-CM

## 2012-09-14 DIAGNOSIS — L97912 Non-pressure chronic ulcer of unspecified part of right lower leg with fat layer exposed: Secondary | ICD-10-CM

## 2012-09-14 DIAGNOSIS — H3530 Unspecified macular degeneration: Secondary | ICD-10-CM | POA: Diagnosis present

## 2012-09-14 DIAGNOSIS — D518 Other vitamin B12 deficiency anemias: Secondary | ICD-10-CM | POA: Diagnosis present

## 2012-09-14 DIAGNOSIS — I10 Essential (primary) hypertension: Secondary | ICD-10-CM | POA: Diagnosis present

## 2012-09-14 HISTORY — DX: Nausea with vomiting, unspecified: R11.2

## 2012-09-14 HISTORY — DX: Nausea with vomiting, unspecified: Z98.890

## 2012-09-14 LAB — COMPREHENSIVE METABOLIC PANEL
Albumin: 3.2 g/dL — ABNORMAL LOW (ref 3.5–5.2)
Alkaline Phosphatase: 51 U/L (ref 39–117)
BUN: 15 mg/dL (ref 6–23)
Calcium: 8.8 mg/dL (ref 8.4–10.5)
Potassium: 3.7 mEq/L (ref 3.5–5.1)
Sodium: 139 mEq/L (ref 135–145)
Total Protein: 6 g/dL (ref 6.0–8.3)

## 2012-09-14 LAB — IRON AND TIBC
Iron: <10 ug/dL — ABNORMAL LOW (ref 42–135)
UIBC: 334 ug/dL (ref 125–400)

## 2012-09-14 LAB — CBC WITH DIFFERENTIAL/PLATELET
Basophils Relative: 0 % (ref 0–1)
Eosinophils Absolute: 0.3 10*3/uL (ref 0.0–0.7)
HCT: 20.9 % — ABNORMAL LOW (ref 36.0–46.0)
Hemoglobin: 6.8 g/dL — CL (ref 12.0–15.0)
MCV: 86.4 fL (ref 78.0–100.0)
Monocytes Relative: 8 % (ref 3–12)
RBC: 2.42 MIL/uL — ABNORMAL LOW (ref 3.87–5.11)
RDW: 14.9 % (ref 11.5–15.5)
WBC: 9.5 10*3/uL (ref 4.0–10.5)

## 2012-09-14 LAB — PROTIME-INR: Prothrombin Time: 13.6 seconds (ref 11.6–15.2)

## 2012-09-14 LAB — FERRITIN: Ferritin: 7 ng/mL — ABNORMAL LOW (ref 10–291)

## 2012-09-14 LAB — ABO/RH: ABO/RH(D): O POS

## 2012-09-14 MED ORDER — LOSARTAN POTASSIUM 50 MG PO TABS
100.0000 mg | ORAL_TABLET | Freq: Every day | ORAL | Status: DC
  Administered 2012-09-15 – 2012-09-17 (×3): 100 mg via ORAL
  Filled 2012-09-14 (×3): qty 2

## 2012-09-14 MED ORDER — PREDNISONE 5 MG PO TABS
5.0000 mg | ORAL_TABLET | Freq: Every morning | ORAL | Status: DC
  Administered 2012-09-15 – 2012-09-17 (×3): 5 mg via ORAL
  Filled 2012-09-14 (×3): qty 1

## 2012-09-14 MED ORDER — ONDANSETRON HCL 4 MG/2ML IJ SOLN: 4.0000 mg | Freq: Three times a day (TID) | INTRAMUSCULAR | Status: AC | PRN

## 2012-09-14 MED ORDER — SODIUM CHLORIDE 0.9 % IV SOLN: INTRAVENOUS | Status: AC

## 2012-09-14 MED ORDER — LOSARTAN POTASSIUM 50 MG PO TABS: 100.0000 mg | ORAL_TABLET | Freq: Every morning | ORAL | Status: DC

## 2012-09-14 MED ORDER — PREDNISONE 1 MG PO TABS
1.0000 mg | ORAL_TABLET | Freq: Every morning | ORAL | Status: DC
  Administered 2012-09-15: 1 mg via ORAL
  Administered 2012-09-16: 09:00:00 via ORAL
  Administered 2012-09-17: 1 mg via ORAL
  Filled 2012-09-14 (×3): qty 1

## 2012-09-14 MED ORDER — SODIUM CHLORIDE 0.9 % IV SOLN
INTRAVENOUS | Status: DC
  Administered 2012-09-14: 1000 mL via INTRAVENOUS
  Administered 2012-09-15 – 2012-09-17 (×2): via INTRAVENOUS

## 2012-09-14 MED ORDER — SIMVASTATIN 20 MG PO TABS
20.0000 mg | ORAL_TABLET | Freq: Every evening | ORAL | Status: DC
  Administered 2012-09-14 – 2012-09-16 (×3): 20 mg via ORAL
  Filled 2012-09-14 (×4): qty 1

## 2012-09-14 MED ORDER — SODIUM CHLORIDE 0.9 % IV SOLN: 250.0000 mL | INTRAVENOUS | Status: DC | PRN

## 2012-09-14 MED ORDER — INSULIN ASPART 100 UNIT/ML ~~LOC~~ SOLN
0.0000 [IU] | Freq: Three times a day (TID) | SUBCUTANEOUS | Status: DC
  Administered 2012-09-14 – 2012-09-15 (×2): 2 [IU] via SUBCUTANEOUS
  Administered 2012-09-15: 3 [IU] via SUBCUTANEOUS
  Administered 2012-09-16: 5 [IU] via SUBCUTANEOUS
  Administered 2012-09-17: 3 [IU] via SUBCUTANEOUS

## 2012-09-14 MED ORDER — SODIUM CHLORIDE 0.9 % IJ SOLN: 3.0000 mL | INTRAMUSCULAR | Status: DC | PRN

## 2012-09-14 MED ORDER — SODIUM CHLORIDE 0.9 % IJ SOLN: 3.0000 mL | Freq: Two times a day (BID) | INTRAMUSCULAR | Status: DC

## 2012-09-14 MED ORDER — PANTOPRAZOLE SODIUM 40 MG IV SOLR
40.0000 mg | Freq: Two times a day (BID) | INTRAVENOUS | Status: DC
  Administered 2012-09-14 – 2012-09-16 (×4): 40 mg via INTRAVENOUS
  Filled 2012-09-14 (×5): qty 40

## 2012-09-14 MED ORDER — AMLODIPINE BESYLATE 5 MG PO TABS
5.0000 mg | ORAL_TABLET | Freq: Every morning | ORAL | Status: DC
  Administered 2012-09-15 – 2012-09-17 (×3): 5 mg via ORAL
  Filled 2012-09-14 (×3): qty 1

## 2012-09-14 MED ORDER — HYDROCODONE-ACETAMINOPHEN 5-325 MG PO TABS
1.0000 | ORAL_TABLET | ORAL | Status: DC | PRN
  Administered 2012-09-15: 2 via ORAL
  Filled 2012-09-14: qty 2

## 2012-09-14 MED ORDER — HYDROMORPHONE HCL PF 1 MG/ML IJ SOLN
1.0000 mg | INTRAMUSCULAR | Status: AC | PRN
  Administered 2012-09-14: 1 mg via INTRAVENOUS
  Filled 2012-09-14: qty 1

## 2012-09-14 MED ORDER — FUROSEMIDE 20 MG PO TABS
20.0000 mg | ORAL_TABLET | Freq: Every day | ORAL | Status: DC | PRN
  Filled 2012-09-14: qty 1

## 2012-09-14 NOTE — ED Notes (Signed)
Pt reports she had blood work done yesterday, pcp called today to report hgb 6.6 and for pt to come to ED. Pt reports weakness. No hx of blood transfusions. Right leg pain from chronic leg ulcer 8/10.

## 2012-09-14 NOTE — Consult Note (Signed)
Referring Provider: Dr. Gonzella Lex Primary Care Physician:  [name], MD Primary Gastroenterologist:  Dr. Madilyn Fireman  Reason for Consultation:  Anemia  HPI: Nancy Glover is a 79 y.o. female with symptomatic anemia with recent problems with dizziness and feeling tired for the past month who was sent to the hospital after having a Hgb 6.6. Hgb 6.8 in ER. She has been receiving hyperbaric treatment at the wound care center for a right tibial wound and Hgb there was 6.6. Denies any melena, hematochezia, N/V, abdominal pain, hematemesis, changes in bowels. She has had a decrease in her appetite for the last week. She has been taking Motrin 2 tabs Q 6 hours since cutting her leg in late December. Denies other NSAIDs. Hgb in November 2013 was 13.8. Colonoscopy in 04/2011 by Dr. Madilyn Fireman showed left-sided diverticulosis. Heme negative in ER. No previous history of ulcers. Denies previous EGD.     Past Medical History  Diagnosis Date  . Hypertension   . Diabetes mellitus, type 2   . Ulcer of right leg   . Venous insufficiency, peripheral   . Polymyalgia arteritica     leg pain  . Macular degeneration of left eye   . PONV (postoperative nausea and vomiting)     Past Surgical History  Procedure Laterality Date  . Cervical biopsy  w/ loop electrode excision  01-17-2001  DR LOMAX  . Cysto/ hod/ bladder bx  10-23-2006  DR PETERSON  . Cataract extraction w/ intraocular lens implant Right     Prior to Admission medications   Medication Sig Start Date End Date Taking? Authorizing Provider  acetaminophen (TYLENOL) 500 MG tablet Take 1,000 mg by mouth every 6 (six) hours as needed (leg pain).   Yes Historical Provider, MD  amLODipine (NORVASC) 5 MG tablet Take 5 mg by mouth every morning.   Yes Historical Provider, MD  furosemide (LASIX) 20 MG tablet Take 20 mg by mouth daily as needed (leg swelling).    Yes Historical Provider, MD  ibuprofen (ADVIL,MOTRIN) 200 MG tablet Take 400 mg by mouth  every 6 (six) hours as needed (leg pain).    Yes Historical Provider, MD  losartan (COZAAR) 100 MG tablet Take 100 mg by mouth every morning.   Yes Historical Provider, MD  metFORMIN (GLUCOPHAGE) 500 MG tablet Take 500 mg by mouth 2 (two) times daily with a meal.   Yes Historical Provider, MD  omeprazole (PRILOSEC OTC) 20 MG tablet Take 20 mg by mouth every morning.    Yes Historical Provider, MD  predniSONE (DELTASONE) 1 MG tablet Take 1 mg by mouth every morning. Take with 5mg  tablet to make 6mg    Yes Historical Provider, MD  predniSONE (DELTASONE) 5 MG tablet Take 5 mg by mouth every morning. Take with 1mg  tablet to make 6mg    Yes Historical Provider, MD  simvastatin (ZOCOR) 20 MG tablet Take 20 mg by mouth every evening.   Yes Historical Provider, MD    Scheduled Meds: . sodium chloride   Intravenous STAT  . sodium chloride   Intravenous STAT   Continuous Infusions:  PRN Meds:.HYDROmorphone (DILAUDID) injection, ondansetron (ZOFRAN) IV, ondansetron (ZOFRAN) IV  Allergies as of 09/14/2012 - Review Complete 09/14/2012  Allergen Reaction Noted  . Actos (pioglitazone) Hives and Swelling 01/05/2012  . Sulfa antibiotics Swelling 01/05/2012    Family History  Problem Relation Age of Onset  . Colon cancer Father     History   Social History  . Marital Status: Married  Spouse Name: N/A    Number of Children: N/A  . Years of Education: N/A   Occupational History  . Not on file.   Social History Main Topics  . Smoking status: Former Smoker -- 1.00 packs/day for 40 years    Types: Cigarettes    Quit date: 09/13/1991  . Smokeless tobacco: Never Used  . Alcohol Use: No  . Drug Use: No  . Sexually Active: No   Other Topics Concern  . Not on file   Social History Narrative  . No narrative on file    Review of Systems: All negative except as stated above in HPI.  Physical Exam: Vital signs: Filed Vitals:   09/14/12 1423  BP: 117/52  Pulse: 93  Temp: 98.5 F (36.9  C)  Resp: 20     General:   Alert,  Well-developed, well-nourished, pleasant and cooperative in NAD HEENT: anicteric Neck: supple, nontender Lungs:  Clear throughout to auscultation.   No wheezes, crackles, or rhonchi. No acute distress. Heart:  Regular rate and rhythm; no murmurs, clicks, rubs,  or gallops. Abdomen: epigastric tenderness with guarding, otherwise nontender, soft, nondistended, +BS  Rectal:  Deferred Ext: dressing on right lower leg, no edema  GI:  Lab Results:  Recent Labs  09/14/12 1210  WBC 9.5  HGB 6.8*  HCT 20.9*  PLT 413*   BMET  Recent Labs  09/14/12 1210  NA 139  K 3.7  CL 105  CO2 21  GLUCOSE 177*  BUN 15  CREATININE 0.76  CALCIUM 8.8   LFT  Recent Labs  09/14/12 1210  PROT 6.0  ALBUMIN 3.2*  AST 10  ALT 7  ALKPHOS 51  BILITOT 0.5   PT/INR  Recent Labs  09/14/12 1210  LABPROT 13.6  INR 1.05     Studies/Results: No results found.  Impression/Plan: 79 yo with symptomatic anemia on chronic NSAIDs for past 2 months whose Hgb is 6.8 (down 7 grams since November). Presentation concerning for an ulcer source although no evidence of an active GI bleed. Agree with transfusion. Continue IV PPI Q 12 hours. Soft diet ok now then clears tonight and NPO after midnight. She will need an EGD this weekend to further evaluate the source of this anemia. I do not think a repeat colonoscopy is needed. Dr. Randa Evens to see tomorrow and decide on timing of EGD.    LOS: 0 days   Hurbert Duran C.  09/14/2012, 3:36 PM

## 2012-09-14 NOTE — H&P (Signed)
[facility] History and Physical  SONOMA FIRKUS NWG:[id number] DOB: May 22, 1934 DOA: 09/14/2012  Referring physician: ED PCP: [name], MD   Chief Complaint:  Sent from wound care center for low hemoglobin  HPI:  79 year old female with history of hypertension, type 2 diabetes mellitus who sustained an injury to her right tibia with a deep ulceration about 2 months back and has been following at the wound care center with Dr. Kelly Splinter was sent from the wound care clinic after she was found to have low hemoglobin of 6.6 yesterday. Patient was in her usual state of health and has been following at the wound care center getting hyperbaric treatment for her right tibial wound. She was scheduled to undergo surgery of the wound on March 3rd. Patient denies any headache, blurry vision, chest pain, palpitations, shortness of breath, fever, chills, nausea, vomiting, abdominal pain, bowel or urinary symptoms. Denies noticing any blood per rectum or dark stools. Denies any hematemesis. She informs taking about 4-6 tablets of Motrin every day for almost 2 months now. Denies any epigastric pain. On reviewing the records from her PCP office she had a hemoglobin of 13.3 and 13.8 in November 2013. She also had a colonoscopy in October 2012 drawn by [facility] showing multiple diverticuli over sigmoid and descending colon. Denies having any EGD done in the past. Denies history of gastric ulcers. Stool for occult blood done in the ED was negative. Repeat hemoglobin checked was 6.8. [facility] called to admit patient for acute anemia.   Review of Systems:  Constitutional: Denies fever, chills, diaphoresis, appetite change , has fatigued for past 4 weeks  HEENT: Denies photophobia, eye pain, redness, hearing loss, ear pain, congestion, sore throat, rhinorrhea, sneezing, mouth sores, trouble swallowing, neck pain, neck stiffness and tinnitus.   Respiratory: Denies SOB, DOE, cough, chest tightness,   and wheezing.   Cardiovascular: Denies chest pain, palpitations and leg swelling.  Gastrointestinal: Denies nausea, vomiting, abdominal pain, diarrhea, constipation, blood in stool and abdominal distention.  Genitourinary: Denies dysuria, urgency, frequency, hematuria, flank pain and difficulty urinating.  Musculoskeletal:Ulceration with pain over right tibia +. Denies myalgias, back pain, joint swelling, arthralgias.  Skin: Denies pallor, rash and wound.  Neurological: Denies dizziness, seizures, syncope, weakness, light-headedness, numbness and headaches.  Hematological: Denies adenopathy. Easy bruising, personal or family bleeding history  Psychiatric/Behavioral: Denies suicidal ideation, mood changes, confusion, nervousness, sleep disturbance and agitation   Past Medical History  Diagnosis Date  . Hypertension   . Diabetes mellitus, type 2   . Ulcer of right leg   . Venous insufficiency, peripheral   . Polymyalgia arteritica     leg pain  . Macular degeneration of left eye   . PONV (postoperative nausea and vomiting)    Past Surgical History  Procedure Laterality Date  . Cervical biopsy  w/ loop electrode excision  01-17-2001  DR LOMAX  . Cysto/ hod/ bladder bx  10-23-2006  DR PETERSON  . Cataract extraction w/ intraocular lens implant Right    Social History:  reports that she quit smoking about 21 years ago. Her smoking use included Cigarettes. She has a 40 pack-year smoking history. She has never used smokeless tobacco. She reports that she does not drink alcohol or use illicit drugs.  Allergies  Allergen Reactions  . Actos (Pioglitazone) Hives and Swelling    Body swelling  . Sulfa Antibiotics Swelling    Tongue and mouth swelling    Family History  Problem Relation Age of Onset  .  Colon cancer Father     Prior to Admission medications   Medication Sig Start Date End Date Taking? Authorizing Provider  acetaminophen (TYLENOL) 500 MG tablet Take 1,000 mg by mouth  every 6 (six) hours as needed (leg pain).   Yes Historical Provider, MD  amLODipine (NORVASC) 5 MG tablet Take 5 mg by mouth every morning.   Yes Historical Provider, MD  furosemide (LASIX) 20 MG tablet Take 20 mg by mouth daily as needed (leg swelling).    Yes Historical Provider, MD  ibuprofen (ADVIL,MOTRIN) 200 MG tablet Take 400 mg by mouth every 6 (six) hours as needed (leg pain).    Yes Historical Provider, MD  losartan (COZAAR) 100 MG tablet Take 100 mg by mouth every morning.   Yes Historical Provider, MD  metFORMIN (GLUCOPHAGE) 500 MG tablet Take 500 mg by mouth 2 (two) times daily with a meal.   Yes Historical Provider, MD  omeprazole (PRILOSEC OTC) 20 MG tablet Take 20 mg by mouth every morning.    Yes Historical Provider, MD  predniSONE (DELTASONE) 1 MG tablet Take 1 mg by mouth every morning. Take with 5mg  tablet to make 6mg    Yes Historical Provider, MD  predniSONE (DELTASONE) 5 MG tablet Take 5 mg by mouth every morning. Take with 1mg  tablet to make 6mg    Yes Historical Provider, MD  simvastatin (ZOCOR) 20 MG tablet Take 20 mg by mouth every evening.   Yes Historical Provider, MD    Physical Exam:  Filed Vitals:   09/14/12 1150 09/14/12 1408 09/14/12 1423  BP: 135/55 102/45 117/52  Pulse: 97 92 93  Temp: 97.8 F (36.6 C) 98.6 F (37 C) 98.5 F (36.9 C)  TempSrc: Oral Oral Oral  Resp: 18 16 20   SpO2: 100%  97%    Constitutional: Vital signs reviewed.  Patient is a well-developed and well-nourished in no acute distress and cooperative with exam. Alert and oriented x3.  Head: Normocephalic and atraumatic Ear: TM normal bilaterally Mouth:  appears pale, no erythema or exudates, MMM Eyes: PERRL, EOMI, conjunctivae normal, No scleral icterus.  Neck: Supple, Trachea midline normal ROM, No JVD, mass, thyromegaly, or carotid bruit present.  Cardiovascular: RRR, S1 normal, S2 normal, no MRG, pulses symmetric and intact bilaterally Pulmonary/Chest: CTAB, no wheezes, rales, or  rhonchi Abdominal: Soft. Non-tender, non-distended, bowel sounds are normal, no masses, organomegaly, or guarding present.  GU: no CVA tenderness Musculoskeletal: No joint deformities, erythema, or stiffness, ROM full and no nontender Ext:  deep ulceration over right tibia extending 6 cm x 3 cm with has the underlying granulation tissue and serous sanguinous discharge . Dressing applied over the wound. no edema and no cyanosis, pulses palpable bilaterally (DP and PT) Hematology: no cervical, inginal, or axillary adenopathy.  Neurological: A&O x3, Strenght is normal and symmetric bilaterally, cranial nerve II-XII are grossly intact, no focal motor deficit, sensory intact to light touch bilaterally.  Skin: Warm, dry and intact. No rash, cyanosis, or clubbing.  Psychiatric: Normal mood and affect. speech and behavior is normal. Judgment and thought content normal. Cognition and memory are normal.   Labs on Admission:  Basic Metabolic Panel:  Recent Labs Lab 09/14/12 1210  NA 139  K 3.7  CL 105  CO2 21  GLUCOSE 177*  BUN 15  CREATININE 0.76  CALCIUM 8.8   Liver Function Tests:  Recent Labs Lab 09/14/12 1210  AST 10  ALT 7  ALKPHOS 51  BILITOT 0.5  PROT 6.0  ALBUMIN 3.2*  No results found for this basename: LIPASE, AMYLASE,  in the last 168 hours No results found for this basename: AMMONIA,  in the last 168 hours CBC:  Recent Labs Lab 09/14/12 1210  WBC 9.5  NEUTROABS 6.1  HGB 6.8*  HCT 20.9*  MCV 86.4  PLT 413*   Cardiac Enzymes: No results found for this basename: CKTOTAL, CKMB, CKMBINDEX, TROPONINI,  in the last 168 hours BNP: No components found with this basename: POCBNP,  CBG:  Recent Labs Lab 09/11/12 1251 09/12/12 1257 09/12/12 1454 09/13/12 1255 09/13/12 1450  GLUCAP 190* 208* 257* 182* 146*    Radiological Exams on Admission: No results found.    Assessment/Plan Principal Problem:   Anemia No clear etiology at this time. She had a  hemoglobin of 13.8 about 3 months ago. She has normal MCV. Stool for occult blood in the ED was negative. She however does inform off taking about 4-6 tablets off Motrin 400 mg daily since past 2 months concerning for erosive gastritis. -Patient admitted admitted to medical floor on telemetry on observation. Patient started on 1 unit PRBC in the ED. Monitor serial H&H every 8 hours. -I will place her on IV Protonix. Avoid NSAIDs. Ordered Vicodin when necessary for pain -She had a colonoscope he done in October 2012 by [facility] showing sigmoid and descending colonic diverticuli. She has not had an EGD done in the past. -I. have spoken with [facility] on call Dr. Bosie Clos will evaluate the patient  Active Problems:   Ulcer of right leg -Following an injury about 2 months back Patient getting frequent dressing and hyperbaric therapy on the wound at the wound care [facility]. She follows with Dr. Kelly Splinter and informs being scheduled for surgery on Monday, March 3. I have called the wound care clinic and left a message to the wound care nurse to follow her up in the hospital for her dressing.    Diabetes mellitus Patient is on metformin at home. I will hold it in place her on sliding scale insulin.    Hypertension Blood pressure stable. Continue home medications   DVT prophylaxis: SCD boots Diet: N.p.o. for now until seen by GI.   Code Status: Full code  Family Communication: None at bedside  Disposition Plan: Home once stable  Eddie North [facility] Pager [phone]  If 7PM-7AM, please contact night-coverage [email or web address] Password [email or web address] 09/14/2012, 3:12 PM      Total time spent on admission: 70 minutes

## 2012-09-14 NOTE — ED Notes (Signed)
Patient does report weakness and increased sleeping.

## 2012-09-14 NOTE — ED Provider Notes (Signed)
History     CSN: [id number]  Arrival date & time 09/14/12  1145   First MD Initiated Contact with Patient 09/14/12 1154      Chief Complaint  Patient presents with  . hgb 6.6     (Consider location/radiation/quality/duration/timing/severity/associated sxs/prior treatment) HPI   Nancy Glover is a 79 y.o. female sent to ED for low hemoglobin. Patient follows with wound care lab work drawn labs yesterday showed her hemoglobin to be 6.6. Patient denies chest pain, palpitations, shortness of breath, lightheaded sensation when going from sitting to standing, nausea, vomiting, abdominal pain, history of alcohol abuse. She states that she has not had a bowel movement in 3 days however she has been passing gas and she may have stool but is slightly darker than normal. She does endorse a general fatigue. Patient had a colonoscopy at [facility] for 5 months ago it was unremarkable as per the patient. She does take a Motrin for chronic abdominal pain she takes 400 mg every 6 hours.   She is a non-insulin-dependent diabetic and she has a poorly healing wound to the right lower extremity she is undergoing hyperbaric therapy for that she is scheduled for dressing change today and also on Sunday she is scheduled for surgery on Monday.   PCP his Merri Brunette   Past Medical History  Diagnosis Date  . Hypertension   . Diabetes mellitus, type 2   . Ulcer of right leg   . Venous insufficiency, peripheral   . Polymyalgia arteritica     leg pain  . Macular degeneration of left eye     Past Surgical History  Procedure Laterality Date  . Cervical biopsy  w/ loop electrode excision  01-17-2001  DR LOMAX  . Cysto/ hod/ bladder bx  10-23-2006  DR PETERSON  . Cataract extraction w/ intraocular lens implant Right     History reviewed. No pertinent family history.  History  Substance Use Topics  . Smoking status: Former Smoker -- 1.00 packs/day for 40 years    Types: Cigarettes    Quit date:  09/13/1991  . Smokeless tobacco: Never Used  . Alcohol Use: No    OB History   Grav Para Term Preterm Abortions TAB SAB Ect Mult Living                  Review of Systems  Constitutional: Positive for fatigue. Negative for fever.  Respiratory: Negative for shortness of breath.   Cardiovascular: Negative for chest pain.  Gastrointestinal: Negative for nausea, vomiting, abdominal pain and diarrhea.  All other systems reviewed and are negative.    Allergies  Actos and Sulfa antibiotics  Home Medications   Current Outpatient Rx  Name  Route  Sig  Dispense  Refill  . amLODipine (NORVASC) 5 MG tablet   Oral   Take 5 mg by mouth every morning.         Marland Kitchen aspirin 81 MG tablet   Oral   Take 81 mg by mouth as needed for pain.         . furosemide (LASIX) 20 MG tablet   Oral   Take 20 mg by mouth daily.         Marland Kitchen ibuprofen (ADVIL,MOTRIN) 200 MG tablet   Oral   Take 200 mg by mouth every 6 (six) hours as needed for pain.         Marland Kitchen losartan (COZAAR) 100 MG tablet   Oral   Take 100 mg by  mouth every morning.         . metFORMIN (GLUCOPHAGE) 500 MG tablet   Oral   Take 500 mg by mouth 2 (two) times daily with a meal.         . Naproxen Sodium (ALEVE) 220 MG CAPS   Oral   Take by mouth as needed.         Marland Kitchen omeprazole (PRILOSEC OTC) 20 MG tablet   Oral   Take 20 mg by mouth every morning.          Marland Kitchen PREDNISONE PO   Oral   Take 6 mg by mouth every morning.         . simvastatin (ZOCOR) 20 MG tablet   Oral   Take 20 mg by mouth every evening.           BP 135/55  Pulse 97  Temp(Src) 97.8 F (36.6 C) (Oral)  Resp 18  SpO2 100%  Physical Exam  Nursing note and vitals reviewed. Constitutional: She is oriented to person, place, and time. She appears well-developed and well-nourished. No distress.  HENT:  Head: Normocephalic and atraumatic.  Mouth/Throat: Oropharynx is clear and moist.  Eyes: Conjunctivae and EOM are normal. Pupils are  equal, round, and reactive to light.  Neck: Normal range of motion.  Cardiovascular: Normal rate.   Pulmonary/Chest: Effort normal and breath sounds normal. No stridor. No respiratory distress. She has no wheezes. She has no rales. She exhibits no tenderness.  Abdominal: Soft. Bowel sounds are normal. She exhibits no distension and no mass. There is tenderness. There is no rebound and no guarding.  Epigastric TTP with no rebound or guarding.   Genitourinary:  Rectal tone is normal, stool is normal color  Musculoskeletal: Normal range of motion.  Neurological: She is alert and oriented to person, place, and time.  Psychiatric: She has a normal mood and affect.    ED Course  Procedures (including critical care time)  Labs Reviewed  CBC WITH DIFFERENTIAL - Abnormal; Notable for the following:    RBC 2.42 (*)    Hemoglobin 6.8 (*)    HCT 20.9 (*)    Platelets 413 (*)    All other components within normal limits  COMPREHENSIVE METABOLIC PANEL - Abnormal; Notable for the following:    Glucose, Bld 177 (*)    Albumin 3.2 (*)    GFR calc non Af Amer 80 (*)    All other components within normal limits  RETICULOCYTES - Abnormal; Notable for the following:    RBC. 2.23 (*)    All other components within normal limits  GLUCOSE, CAPILLARY - Abnormal; Notable for the following:    Glucose-Capillary 142 (*)    All other components within normal limits  PROTIME-INR  VITAMIN B12  FOLATE  IRON AND TIBC  FERRITIN  CBC  OCCULT BLOOD, POC DEVICE  TYPE AND SCREEN  PREPARE RBC (CROSSMATCH)  PREPARE RBC (CROSSMATCH)   No results found.   Date: 09/14/2012  Rate: 88  Rhythm: normal sinus rhythm  QRS Axis: normal  Intervals: normal  ST/T Wave abnormalities: normal  Conduction Disutrbances:none  Narrative Interpretation:   Old EKG Reviewed: unchanged  1. Anemia       MDM   Patient with unexplained anemia. H&H is 6.8 over 20.9. Patient reports she had normal colonoscopy 5 months  ago. Stool guaiac is negative. Anemia panel sent. Patient will be transfused 1 unit and crossmatch for an additional unit.    Filed Vitals:  09/14/12 1150 09/14/12 1408  BP: 135/55 102/45  Pulse: 97 92  Temp: 97.8 F (36.6 C) 98.6 F (37 C)  TempSrc: Oral Oral  Resp: 18 16  SpO2: 100%     Med surg obs team 5 [place]     Wynetta Emery, PA-C 09/14/12 2052

## 2012-09-15 ENCOUNTER — Encounter (HOSPITAL_COMMUNITY)
Admission: EM | Disposition: A | Discharge status: Home / Self Care | Payer: Self-pay | Source: Home / Self Care | Attending: Internal Medicine

## 2012-09-15 ENCOUNTER — Encounter (HOSPITAL_COMMUNITY): Payer: Self-pay | Admitting: *Deleted

## 2012-09-15 DIAGNOSIS — I10 Essential (primary) hypertension: Secondary | ICD-10-CM

## 2012-09-15 HISTORY — PX: ESOPHAGOGASTRODUODENOSCOPY: SHX5428

## 2012-09-15 LAB — CBC
HCT: 25.2 % — ABNORMAL LOW (ref 36.0–46.0)
HCT: 26.3 % — ABNORMAL LOW (ref 36.0–46.0)
HCT: 27.6 % — ABNORMAL LOW (ref 36.0–46.0)
Hemoglobin: 8.4 g/dL — ABNORMAL LOW (ref 12.0–15.0)
Hemoglobin: 8.7 g/dL — ABNORMAL LOW (ref 12.0–15.0)
Hemoglobin: 9.2 g/dL — ABNORMAL LOW (ref 12.0–15.0)
MCH: 28.6 pg (ref 26.0–34.0)
MCH: 28.3 pg (ref 26.0–34.0)
MCH: 28.5 pg (ref 26.0–34.0)
MCHC: 33.3 g/dL (ref 30.0–36.0)
MCV: 85.7 fL (ref 78.0–100.0)
MCV: 85.7 fL (ref 78.0–100.0)
RBC: 2.94 MIL/uL — ABNORMAL LOW (ref 3.87–5.11)
RBC: 3.07 MIL/uL — ABNORMAL LOW (ref 3.87–5.11)
RBC: 3.23 MIL/uL — ABNORMAL LOW (ref 3.87–5.11)
WBC: 8.1 10*3/uL (ref 4.0–10.5)
WBC: 8 10*3/uL (ref 4.0–10.5)

## 2012-09-15 LAB — TYPE AND SCREEN
ABO/RH(D): O POS
Antibody Screen: NEGATIVE
Unit division: 0
Unit division: 0

## 2012-09-15 SURGERY — EGD (ESOPHAGOGASTRODUODENOSCOPY)
Anesthesia: Moderate Sedation

## 2012-09-15 MED ORDER — BUTAMBEN-TETRACAINE-BENZOCAINE 2-2-14 % EX AERO
INHALATION_SPRAY | CUTANEOUS | Status: DC | PRN
  Administered 2012-09-15: 2 via TOPICAL

## 2012-09-15 MED ORDER — SODIUM CHLORIDE 0.9 % IV SOLN
INTRAVENOUS | Status: DC
  Administered 2012-09-15: 15:00:00 via INTRAVENOUS

## 2012-09-15 MED ORDER — FENTANYL CITRATE 0.05 MG/ML IJ SOLN
INTRAMUSCULAR | Status: AC
  Filled 2012-09-15: qty 2

## 2012-09-15 MED ORDER — MIDAZOLAM HCL 10 MG/2ML IJ SOLN
INTRAMUSCULAR | Status: DC | PRN
  Administered 2012-09-15: 1 mg via INTRAVENOUS
  Administered 2012-09-15 (×2): 2 mg via INTRAVENOUS

## 2012-09-15 MED ORDER — MIDAZOLAM HCL 10 MG/2ML IJ SOLN
INTRAMUSCULAR | Status: AC
  Filled 2012-09-15: qty 2

## 2012-09-15 MED ORDER — FENTANYL CITRATE 0.05 MG/ML IJ SOLN
INTRAMUSCULAR | Status: DC | PRN
  Administered 2012-09-15 (×2): 25 ug via INTRAVENOUS

## 2012-09-15 NOTE — Progress Notes (Signed)
[facility] PROGRESS NOTE  Nancy Glover ZOX:[id number] DOB: 1934-01-27 DOA: 09/14/2012 PCP: [name], MD  Brief narrative 79 year old female with history of hypertension, type 2 diabetes mellitus who sustained an injury to her right tibia with a deep ulceration about 2 months back and has been following at the wound care center with Dr. Kelly Splinter was sent from the wound care clinic after she was found to have low hemoglobin of 6.6  Assessment/Plan: New onset anemia Hemoglobin of 13.8 about 3 months back. Stool for occult blood in the ED was negative. Given daily use of 4-6 tablets of Motrin for past 2 months erosive gastritis is concerning. Seen by [facility]. Started on IV PPI. Given 2 units PRBC and hemoglobin now improved appropriately. Plan for EGD later today. Colonoscopy done in 2012 showing diverticuli only.  Ulcer of right leg  -Following an injury about 2 months back  Patient getting frequent dressing and hyperbaric therapy  at the wound care center. She follows with Dr. Kelly Splinter and informs being scheduled for surgery on Monday, March 3. I have called the wound care clinic and left a message to the wound care nurse to follow her up in the hospital for her dressing.  Wound consult placed as well. Will discuss with Dr. Kelly Splinter if she wants to do the surgery while patient is here in the hospital.  Diabetes mellitus  Patient is on metformin at home. I will hold it in place her on sliding scale insulin.   Hypertension  Blood pressure stable. Continue home medications   DVT prophylaxis: SCD boots  Code Status: Full code Family Communication: None at bedside Disposition Plan: Home once stable. Will discuss with Dr. Kelly Splinter if planned for surgery while patient is in the hospital.   Consultants:  [facility]  Procedures:  Scheduled for EGD today.  Antibiotics:  None  HPI/Subjective: Denies any symptoms. Scheduled for EGD today. She 20 PRBC and hemoglobin  improved.  Objective: Filed Vitals:   09/14/12 2022 09/14/12 2137 09/15/12 0535 09/15/12 0656  BP: 111/50 122/70 151/64 132/64  Pulse: 94 93 89   Temp: 98.6 F (37 C) 98.6 F (37 C) 98.2 F (36.8 C)   TempSrc: Oral Oral Oral   Resp: 16 18 18    Height:      Weight:      SpO2:  94% 94%     Intake/Output Summary (Last 24 hours) at 09/15/12 1000 Last data filed at 09/15/12 0730  Gross per 24 hour  Intake 1533.75 ml  Output      0 ml  Net 1533.75 ml   Filed Weights   09/14/12 1500 09/14/12 1540  Weight: 74.4 kg (164 lb 0.4 oz) 74.4 kg (164 lb 0.4 oz)    Exam:   General:  Elderly female lying in bed in no acute distress  HEENT: No pallor, moist oral mucosa  Cardiovascular: Normal S1 and S2, no murmurs rub or gallop  Respiratory: Care to auscultation bilaterally no added sounds  Abdomen: Soft, nontender, nondistended, bowel sounds present  Extremities warm, no edema, dressing over right tibial wound  CNS: AAO x3  Data Reviewed: Basic Metabolic Panel:  Recent Labs Lab 09/14/12 1210  NA 139  K 3.7  CL 105  CO2 21  GLUCOSE 177*  BUN 15  CREATININE 0.76  CALCIUM 8.8   Liver Function Tests:  Recent Labs Lab 09/14/12 1210  AST 10  ALT 7  ALKPHOS 51  BILITOT 0.5  PROT 6.0  ALBUMIN 3.2*  No results found for this basename: LIPASE, AMYLASE,  in the last 168 hours No results found for this basename: AMMONIA,  in the last 168 hours CBC:  Recent Labs Lab 09/14/12 1210 09/15/12 0320  WBC 9.5 8.1  NEUTROABS 6.1  --   HGB 6.8* 8.4*  HCT 20.9* 25.2*  MCV 86.4 85.7  PLT 413* 324   Cardiac Enzymes: No results found for this basename: CKTOTAL, CKMB, CKMBINDEX, TROPONINI,  in the last 168 hours BNP (last 3 results) No results found for this basename: PROBNP,  in the last 8760 hours CBG:  Recent Labs Lab 09/12/12 1454 09/13/12 1255 09/13/12 1450 09/14/12 1638 09/14/12 2250  GLUCAP 257* 182* 146* 142* 163*    No results found for this or  any previous visit (from the past 240 hour(s)).   Studies: No results found.  Scheduled Meds: . sodium chloride   Intravenous STAT  . sodium chloride   Intravenous STAT  . amLODipine  5 mg Oral q morning - 10a  . insulin aspart  0-15 Units Subcutaneous TID WC  . losartan  100 mg Oral Daily  . pantoprazole (PROTONIX) IV  40 mg Intravenous Q12H  . predniSONE  1 mg Oral q morning - 10a  . predniSONE  5 mg Oral q morning - 10a  . simvastatin  20 mg Oral QPM  . sodium chloride  3 mL Intravenous Q12H   Continuous Infusions: . sodium chloride 1,000 mL (09/14/12 2029)      Time spent:25 minutes    Nancy Glover  [facility] Pager [phone] If 8PM-8AM, please contact night-coverage at [email or web address], password [facility] 09/15/2012, 10:00 AM  LOS: 1 day

## 2012-09-15 NOTE — Op Note (Signed)
[facility] [address] [place], [address]   ENDOSCOPY PROCEDURE REPORT  PATIENT: Nancy Glover, Nancy Glover  MR#: [id number] BIRTHDATE: 02/21/1934 , 78  yrs. old GENDER: Female ENDOSCOPIST:Lenola Lockner, MD REFERRED BY:  [facility] PROCEDURE DATE:  09/15/2012 PROCEDURE:      EGD ASA CLASS:  class 2 INDICATIONS:   positive stool and iron deficiency anemia MEDICATION:   fentanyl 50 mcg, versed 5 mg IV TOPICAL ANESTHETIC:   cetacaine spray  DESCRIPTION OF PROCEDURE:   Pentax adult upper endoscope past finally into the esophagus with deglutination. Suffix is completely normal with no ulceration or signs of bleeding. Large hiatal hernia with no active bleeding or signs of recent bleeding. Stomach entered and examined in foward and retroflex view. No ulceration or inflammation or signs of active or recent bleeding. Scope past into duodenum and duodenum was normal down to the 2nd portion. The scope was withdrawn in the initial findings were confirmed. The patient tolerated procedure well.     COMPLICATIONS: None  ENDOSCOPIC IMPRESSION: 1. Iron deficiency anemia with heme positive stool. EGD negative.  RECOMMENDATIONS: will begin patient on clear liquids. Follow her stools. We'll attempt to get results of her recent colonoscopy. This may need to be repeated. Will discuss this with her tomorrow.    _______________________________ Rosalie DoctorCarman Ching, MD 09/15/2012 3:55 PM

## 2012-09-15 NOTE — Interval H&P Note (Signed)
History and Physical Interval Note:  09/15/2012 3:30 PM  Nancy Glover  has presented today for surgery, with the diagnosis of anemia  The various methods of treatment have been discussed with the patient and family. After consideration of risks, benefits and other options for treatment, the patient has consented to  Procedure(s): ESOPHAGOGASTRODUODENOSCOPY (EGD) (N/A) as a surgical intervention .  The patient's history has been reviewed, patient examined, no change in status, stable for surgery.  I have reviewed the patient's chart and labs.  Questions were answered to the patient's satisfaction.     Nancy Glover,Nancy Glover

## 2012-09-15 NOTE — ED Provider Notes (Signed)
Medical screening examination/treatment/procedure(s) were performed by non-physician practitioner and as supervising physician I was immediately available for consultation/collaboration.   Dereck Agerton L [place], MD 09/15/12 0731 

## 2012-09-16 LAB — CBC
HCT: 27.1 % — ABNORMAL LOW (ref 36.0–46.0)
Hemoglobin: 9.3 g/dL — ABNORMAL LOW (ref 12.0–15.0)
MCH: 28.2 pg (ref 26.0–34.0)
MCV: 85.8 fL (ref 78.0–100.0)
Platelets: 312 10*3/uL (ref 150–400)
RBC: 3.16 MIL/uL — ABNORMAL LOW (ref 3.87–5.11)
RBC: 3.22 MIL/uL — ABNORMAL LOW (ref 3.87–5.11)
WBC: 7.3 10*3/uL (ref 4.0–10.5)

## 2012-09-16 LAB — GLUCOSE, CAPILLARY
Glucose-Capillary: 101 mg/dL — ABNORMAL HIGH (ref 70–99)
Glucose-Capillary: 149 mg/dL — ABNORMAL HIGH (ref 70–99)
Glucose-Capillary: 245 mg/dL — ABNORMAL HIGH (ref 70–99)

## 2012-09-16 MED ORDER — PANTOPRAZOLE SODIUM 40 MG PO TBEC
40.0000 mg | DELAYED_RELEASE_TABLET | Freq: Two times a day (BID) | ORAL | Status: DC
  Administered 2012-09-16 – 2012-09-17 (×2): 40 mg via ORAL
  Filled 2012-09-16 (×3): qty 1

## 2012-09-16 MED ORDER — PANTOPRAZOLE SODIUM 40 MG PO TBEC
40.0000 mg | DELAYED_RELEASE_TABLET | Freq: Two times a day (BID) | ORAL | Status: DC
  Filled 2012-09-16: qty 1

## 2012-09-16 NOTE — Progress Notes (Signed)
Pharmacy: IV to PO Protonix  Patient has been receiving IV Protonix. Per Pharmacy and Therapeutics Committee, this patient meets criteria for auto conversion to PO Protonix:   Tolerating a diet of full liquids or better  Tolerating other medications via enteral route  No GIB (EGD negative, stool occult negative)  Gwen Her PharmD  [phone] 08/15/2011 8:54 AM

## 2012-09-16 NOTE — Progress Notes (Signed)
[facility] PROGRESS NOTE Subjective Pt w/o gross bleeding. Had 7 gm drop in Hg since November with only 1 episode of small amt of BRB on tissue. EGD negative, had screening colon by Dr Madilyn Fireman 10/12 showing diverticular disease only. Pt scheduled to have procedure on leg tomorrow for deep wound to tibia by the wound care center.  Objective: Vital signs in last 24 hours: Temp:  [97.5 F (36.4 C)-98.7 F (37.1 C)] 98 F (36.7 C) (03/02 0605) Pulse Rate:  [82-90] 82 (03/02 0605) Resp:  [4-71] 18 (03/02 0605) BP: (126-164)/(55-104) 148/72 mmHg (03/02 0605) SpO2:  [86 %-98 %] 93 % (03/02 0739) Last BM Date: 09/13/12  Intake/Output from previous day: 03/01 0701 - 03/02 0700 In: 935 [P.O.:360; I.V.:575] Out: -  Intake/Output this shift: Total I/O In: 600 [P.O.:600] Out: -     Lab Results:  Recent Labs  09/14/12 1210 09/15/12 0320 09/15/12 1050 09/15/12 1845 09/16/12 0250  WBC 9.5 8.1 8.0 7.9 7.3  HGB 6.8* 8.4* 8.7* 9.2* 8.9*  HCT 20.9* 25.2* 26.3* 27.6* 27.1*  PLT 413* 324 299 317 285   BMET  Recent Labs  09/14/12 1210  NA 139  K 3.7  CL 105  CO2 21  CREATININE 0.76   LFT  Recent Labs  09/14/12 1210  PROT 6.0  AST 10  ALT 7  ALKPHOS 51  BILITOT 0.5   PT/INR  Recent Labs  09/14/12 1210  LABPROT 13.6  INR 1.05   PANCREAS No results found for this basename: LIPASE,  in the last 72 hours       Studies/Results: No results found.  Medications: I have reviewed the patient's current medications.  Assessment/Plan: 1. Anemia. EGD negative, no gross GI bleeding. Should probably have repeat colonoscopy but should be ok to wait until after her wound surgery tomorrow. Will advance diet for now. 2. Deep wound to Rt tibia. For procedure tomorrow. ? If she lost some blood from that.  Will check stools for FOB and advance diet.  EDWARDS JR,JAMES L 09/16/2012, 9:15 AM

## 2012-09-16 NOTE — Progress Notes (Signed)
[facility] PROGRESS NOTE    Brief narrative  79 year old female with history of hypertension, type 2 diabetes mellitus who sustained an injury to her right tibia with a deep ulceration about 2 months back and has been following at the wound care center with Dr. Kelly Splinter was sent from the wound care clinic after she was found to have low hemoglobin of 6.6.  Assessment/Plan:  New onset anemia  Hemoglobin of 13.8 about 3 months back. Stool for occult blood in the ED was negative.Seen by [facility]. Started on IV PPI. Given 2 units PRBC and hemoglobin . now improved appropriately. EGD on 3/1 normal. Colonoscopy done in 2012 showing diverticuli only.  GI recommend to advance diet and decide on need for colonoscopy likely as outpatient.   Ulcer of right leg  -Following an injury about 2 months back  Patient getting frequent dressing and hyperbaric therapy at the wound care center. She follows with Dr. Kelly Splinter and informs being scheduled for surgery on Monday, March 3. I have called the wound care clinic and left a message to the wound care nurse to follow her up in the hospital for her dressing.  Wound consult placed as well. Will discuss with Dr. Kelly Splinter about timing of surgery. If H&H stable , can d/c her directly to wound care center  Diabetes mellitus  Patient is on metformin at home which is being held here. Continue SSI   Hypertension  Blood pressure stable. Continue home medications   DVT prophylaxis: SCD boots   Code Status: Full code  Family Communication: None at bedside  Disposition Plan: Home likely tomorrow if stable.   Consultants:  [facility]  Procedures:  EGD on 3/1 Antibiotics:  None   HPI/Subjective: No overnight issues. H&H stable. EGD done yesterday normal.   Objective: Filed Vitals:   09/15/12 2157 09/16/12 0605 09/16/12 0606 09/16/12 0739  BP: 135/87 148/72    Pulse: 88 82    Temp: 98.4 F (36.9 C) 98 F (36.7 C)    TempSrc: Oral Oral    Resp: 20  18    Height:      Weight:      SpO2: 96% 86% 93% 93%    Intake/Output Summary (Last 24 hours) at 09/16/12 1041 Last data filed at 09/16/12 0834  Gross per 24 hour  Intake   1415 ml  Output      0 ml  Net   1415 ml   Filed Weights   09/14/12 1500 09/14/12 1540  Weight: 74.4 kg (164 lb 0.4 oz) 74.4 kg (164 lb 0.4 oz)    Exam:  General: Elderly female lying in bed in no acute distress  HEENT: No pallor, moist oral mucosa  Cardiovascular: Normal S1 and S2, no murmurs rub or gallop  Respiratory: Care to auscultation bilaterally no added sounds  Abdomen: Soft, nontender, nondistended, bowel sounds present  Extremities warm, no edema, dressing over right tibial wound  CNS: AAO x3   Data Reviewed: Basic Metabolic Panel:  Recent Labs Lab 09/14/12 1210  NA 139  K 3.7  CL 105  CO2 21  GLUCOSE 177*  BUN 15  CREATININE 0.76  CALCIUM 8.8   Liver Function Tests:  Recent Labs Lab 09/14/12 1210  AST 10  ALT 7  ALKPHOS 51  BILITOT 0.5  PROT 6.0  ALBUMIN 3.2*   No results found for this basename: LIPASE, AMYLASE,  in the last 168 hours No results found for this basename: AMMONIA,  in the last  168 hours CBC:  Recent Labs Lab 09/14/12 1210 09/15/12 0320 09/15/12 1050 09/15/12 1845 09/16/12 0250  WBC 9.5 8.1 8.0 7.9 7.3  NEUTROABS 6.1  --   --   --   --   HGB 6.8* 8.4* 8.7* 9.2* 8.9*  HCT 20.9* 25.2* 26.3* 27.6* 27.1*  MCV 86.4 85.7 85.7 85.4 85.8  PLT 413* 324 299 317 285   Cardiac Enzymes: No results found for this basename: CKTOTAL, CKMB, CKMBINDEX, TROPONINI,  in the last 168 hours BNP (last 3 results) No results found for this basename: PROBNP,  in the last 8760 hours CBG:  Recent Labs Lab 09/15/12 0731 09/15/12 1122 09/15/12 1618 09/15/12 2148 09/16/12 0724  GLUCAP 97 183* 136* 141* 101*    No results found for this or any previous visit (from the past 240 hour(s)).   Studies: No results found.  Scheduled Meds: . amLODipine  5 mg Oral q  morning - 10a  . insulin aspart  0-15 Units Subcutaneous TID WC  . losartan  100 mg Oral Daily  . pantoprazole  40 mg Oral BID AC  . predniSONE  1 mg Oral q morning - 10a  . predniSONE  5 mg Oral q morning - 10a  . simvastatin  20 mg Oral QPM  . sodium chloride  3 mL Intravenous Q12H   Continuous Infusions: . sodium chloride 75 mL/hr at 09/15/12 1145      Time spent:35 minutes    Tracey Stewart  [facility] Pager [phone]. If 8PM-8AM, please contact night-coverage at [email or web address], password [facility] 09/16/2012, 10:41 AM  LOS: 2 days

## 2012-09-17 ENCOUNTER — Encounter (HOSPITAL_COMMUNITY): Payer: Self-pay | Admitting: Gastroenterology

## 2012-09-17 ENCOUNTER — Encounter (HOSPITAL_BASED_OUTPATIENT_CLINIC_OR_DEPARTMENT_OTHER): Payer: Medicare Other | Attending: General Surgery

## 2012-09-17 ENCOUNTER — Ambulatory Visit (HOSPITAL_BASED_OUTPATIENT_CLINIC_OR_DEPARTMENT_OTHER): Admission: RE | Admit: 2012-09-17 | Payer: Medicare Other | Source: Ambulatory Visit | Admitting: Plastic Surgery

## 2012-09-17 DIAGNOSIS — I872 Venous insufficiency (chronic) (peripheral): Secondary | ICD-10-CM | POA: Insufficient documentation

## 2012-09-17 DIAGNOSIS — D518 Other vitamin B12 deficiency anemias: Secondary | ICD-10-CM

## 2012-09-17 DIAGNOSIS — L97809 Non-pressure chronic ulcer of other part of unspecified lower leg with unspecified severity: Secondary | ICD-10-CM | POA: Insufficient documentation

## 2012-09-17 DIAGNOSIS — D519 Vitamin B12 deficiency anemia, unspecified: Secondary | ICD-10-CM | POA: Diagnosis present

## 2012-09-17 DIAGNOSIS — E1169 Type 2 diabetes mellitus with other specified complication: Secondary | ICD-10-CM | POA: Insufficient documentation

## 2012-09-17 HISTORY — DX: Type 2 diabetes mellitus without complications: E11.9

## 2012-09-17 HISTORY — DX: Unspecified macular degeneration: H35.30

## 2012-09-17 HISTORY — DX: Non-pressure chronic ulcer of unspecified part of right lower leg with unspecified severity: L97.919

## 2012-09-17 HISTORY — DX: Venous insufficiency (chronic) (peripheral): I87.2

## 2012-09-17 LAB — TSH: TSH: 0.389 u[IU]/mL (ref 0.350–4.500)

## 2012-09-17 LAB — GLUCOSE, CAPILLARY: Glucose-Capillary: 207 mg/dL — ABNORMAL HIGH (ref 70–99)

## 2012-09-17 SURGERY — IRRIGATION AND DEBRIDEMENT WOUND
Anesthesia: General | Laterality: Right

## 2012-09-17 MED ORDER — OXYCODONE-ACETAMINOPHEN 5-325 MG PO TABS: 1.0000 | ORAL_TABLET | Freq: Four times a day (QID) | ORAL | Status: DC | PRN

## 2012-09-17 MED ORDER — CYANOCOBALAMIN 1000 MCG PO TABS: 1000.0000 ug | ORAL_TABLET | Freq: Every day | ORAL | Status: DC

## 2012-09-17 NOTE — Progress Notes (Signed)
Voices understanding of d/c instructions.  No changes noted since am assessment.   

## 2012-09-17 NOTE — Progress Notes (Signed)
[facility]  NAME:  Nancy Glover, Nancy Glover                ACCOUNT NO.:  [id number]  MEDICAL RECORD NO.:  [id number]      DATE OF BIRTH:  1933-12-01  PHYSICIAN:  Wayland Denis, DO       VISIT DATE:  09/17/2012                                  OFFICE VISIT   The patient is a 79 year old female who is here for followup on her right lower extremity leg ulcer.  She was admitted for anemia and underwent transfusion, and she is doing much better.  There is still looking into the reason why she has the anemia and that is not clear.  On exam, she is alert, oriented, cooperative, not in any acute distress. She is very pleasant.  Pupils are equal.  Extraocular muscles are intact.  No cervical lymphadenopathy.  Her breathing is unlabored.  Her heart is regular.  Her abdomen is soft.  The ulcer actually looks improved and shows some granulation tissue.  We will use Endoform and see her back in a week.     Wayland Denis, DO     CS/MEDQ  D:  09/17/2012  T:  09/17/2012  Job:  [id number]

## 2012-09-17 NOTE — Progress Notes (Signed)
Dr Gonzella Lex here to see pt.

## 2012-09-17 NOTE — Discharge Summary (Signed)
Physician Discharge Summary  Nancy Glover ZOX:[id number] DOB: 10/22/1933 DOA: 09/14/2012  PCP: [name], MD  Admit date: 09/14/2012 Discharge date: 09/17/2012  Time spent: 40 minutes  Recommendations for Outpatient Follow-up:  1. Home with outpt follow up in wound clinic and [facility] 2. Monitor H&H as outpatient  Discharge Diagnoses:  Principal Problem:   Anemia  Active Problems:   Ulcer of right leg   Diabetes mellitus   Hypertension   Vitamin B12 deficiency anemia   Discharge Condition: fair  Diet recommendation: regular  Filed Weights   09/14/12 1500 09/14/12 1540  Weight: 74.4 kg (164 lb 0.4 oz) 74.4 kg (164 lb 0.4 oz)    History of present illness:  Please refer to admission H&P for details but in brief, 79 year old female with history of hypertension, type 2 diabetes mellitus who sustained an injury to her right tibia with a deep ulceration about 2 months back and has been following at the wound care center with Dr. Kelly Splinter was sent from the wound care clinic after she was found to have low hemoglobin of 6.6.   Hospital Course:    New onset anemia  Hemoglobin of 13.8 about 3 months back. Stool for occult blood in the ED was negative.Seen by [facility]. Started on IV PPI. Given 2 units PRBC and hemoglobin . now improved appropriately. EGD on 3/1 normal. Colonoscopy done in 2012 showing diverticuli only.  GI recommend to advance diet outpatient follow up with plan on colonoscopy. her iron panel does suggest low iron but also has a low vitamin B12 level. i will start her on po cyanocobalamin. Will also check TSH which an be followed as outpatient. Patient is clinically stable and can be discharged home with outpt follow up. Recommended to avoid NSAIDs and will prescribe some percocet.    Ulcer of right leg  -Following an injury about 2 months back  Patient getting frequent dressing and hyperbaric therapy at the wound care center. She follows with Dr. Kelly Splinter  and was  scheduled for debridement surgery today. Discussed with Dr Kelly Splinter today. Surgery cancelled given pts anemia and will follow up in the clinic as outpatient.  Diabetes mellitus  Resume metformin  Hypertension  Blood pressure stable. Continue home medications    Code Status: Full code   Disposition Plan: Home  Consultants:  [facility]    Procedures:  EGD on 3/1   Antibiotics:  None       Discharge Exam: Filed Vitals:   09/16/12 1357 09/16/12 2118 09/17/12 0545 09/17/12 0844  BP: 150/72 131/73 138/76 167/79  Pulse: 90 94 98 93  Temp: 98.8 F (37.1 C) 98.6 F (37 C) 98.6 F (37 C)   TempSrc: Oral Oral Oral   Resp: 18 17 18    Height:      Weight:      SpO2: 94% 89% 88%    General: Elderly female lying in bed in no acute distress  HEENT: No pallor, moist oral mucosa  Cardiovascular: Normal S1 and S2, no murmurs rub or gallop  Respiratory: Care to auscultation bilaterally no added sounds  Abdomen: Soft, nontender, nondistended, bowel sounds present  Extremities warm, no edema, dressing over right tibial wound  CNS: AAO x3  Discharge Instructions   Future Appointments Provider Department Dept Phone   09/17/2012 1:00 PM [facility] [phone]       Medication List    STOP taking these medications  ibuprofen 200 MG tablet  Commonly known as:  ADVIL,MOTRIN      TAKE these medications       acetaminophen 500 MG tablet  Commonly known as:  TYLENOL  Take 1,000 mg by mouth every 6 (six) hours as needed (leg pain).     amLODipine 5 MG tablet  Commonly known as:  NORVASC  Take 5 mg by mouth every morning.     cyanocobalamin 1000 MCG tablet  Commonly known as:  [employer] VITAMIN B12  Take 1 tablet (1,000 mcg total) by mouth daily.     furosemide 20 MG tablet  Commonly known as:  LASIX  Take 20 mg by mouth daily as needed (leg swelling).     losartan 100 MG tablet  Commonly known as:  COZAAR   Take 100 mg by mouth every morning.     metFORMIN 500 MG tablet  Commonly known as:  GLUCOPHAGE  Take 500 mg by mouth 2 (two) times daily with a meal.     omeprazole 20 MG tablet  Commonly known as:  PRILOSEC OTC  Take 20 mg by mouth every morning.     oxyCODONE-acetaminophen 5-325 MG per tablet  Commonly known as:  ROXICET  Take 1 tablet by mouth every 6 (six) hours as needed for pain.     predniSONE 1 MG tablet  Commonly known as:  DELTASONE  Take 1 mg by mouth every morning. Take with 5mg  tablet to make 6mg      predniSONE 5 MG tablet  Commonly known as:  DELTASONE  Take 5 mg by mouth every morning. Take with 1mg  tablet to make 6mg      simvastatin 20 MG tablet  Commonly known as:  ZOCOR  Take 20 mg by mouth every evening.           Follow-up Information   Follow up with [name], MD In 1 week.   Contact information:   [address] [place] [address] [phone]       Follow up with HAYES,JOHN C, MD. Schedule an appointment as soon as possible for a visit in 4 weeks.   Contact information:   [address]                         [place] [address] [phone]       Follow up with [facility], DO. (will be called fro follow up)    Contact information:   [address] [place] [address] [phone]        The results of significant diagnostics from this hospitalization (including imaging, microbiology, ancillary and laboratory) are listed below for reference.    Significant Diagnostic Studies: Dg Chest 2 View  08/23/2012  *RADIOLOGY REPORT*  Clinical Data: Open wound.  Pre hyperbaric by evaluation. Controlled hypertension.  History of prior smoking  CHEST - 2 VIEW  Comparison: 01/04/2012  Findings: Heart size is within normal limits.  Ectasia of the thoracic aorta with aortic calcification is stable.  The lung fields demonstrate prominence of the interstitial markings diffusely compatible with  underlying bronchitic change and correlating with history of prior smoking. No new focal infiltrates or signs of congestive failure seen.  No pleural fluid or significant peribronchial cuffing is noted  Bony structures are stable with multiple healed right-sided mid and lower rib fractures again seen.  IMPRESSION: Stable cardiopulmonary appearance with no new focal or acute abnormality suggested  Original Report Authenticated By: Rhodia Albright, M.D.     Microbiology: No results found for this or any previous visit (from the past 240 hour(s)).   Labs: Basic Metabolic Panel:  Recent Labs Lab 09/14/12 1210  NA 139  K 3.7  CL 105  CO2 21  GLUCOSE 177*  BUN 15  CREATININE 0.76  CALCIUM 8.8   Liver Function Tests:  Recent Labs Lab 09/14/12 1210  AST 10  ALT 7  ALKPHOS 51  BILITOT 0.5  PROT 6.0  ALBUMIN 3.2*   No results found for this basename: LIPASE, AMYLASE,  in the last 168 hours No results found for this basename: AMMONIA,  in the last 168 hours CBC:  Recent Labs Lab 09/14/12 1210 09/15/12 0320 09/15/12 1050 09/15/12 1845 09/16/12 0250 09/16/12 1055  WBC 9.5 8.1 8.0 7.9 7.3 8.4  NEUTROABS 6.1  --   --   --   --   --   HGB 6.8* 8.4* 8.7* 9.2* 8.9* 9.3*  HCT 20.9* 25.2* 26.3* 27.6* 27.1* 27.7*  MCV 86.4 85.7 85.7 85.4 85.8 86.0  PLT 413* 324 299 317 285 312   Cardiac Enzymes: No results found for this basename: CKTOTAL, CKMB, CKMBINDEX, TROPONINI,  in the last 168 hours BNP: BNP (last 3 results) No results found for this basename: PROBNP,  in the last 8760 hours CBG:  Recent Labs Lab 09/15/12 2148 09/16/12 0724 09/16/12 1130 09/16/12 1709 09/16/12 1954  GLUCAP 141* 101* 149* 245* 212*       Signed:  DHUNGEL, NISHANT  [facility] 09/17/2012, 10:05 AM

## 2012-09-17 NOTE — Progress Notes (Signed)
Monitor reading hr 149 vs taken and pt states that she felt her heart race now hr 93 .  Text sent to Dr Gonzella Lex.

## 2012-09-19 LAB — GLUCOSE, CAPILLARY: Glucose-Capillary: 308 mg/dL — ABNORMAL HIGH (ref 70–99)

## 2012-09-20 LAB — GLUCOSE, CAPILLARY
Glucose-Capillary: 285 mg/dL — ABNORMAL HIGH (ref 70–99)
Glucose-Capillary: 197 mg/dL — ABNORMAL HIGH (ref 70–99)

## 2012-09-24 LAB — GLUCOSE, CAPILLARY: Glucose-Capillary: 202 mg/dL — ABNORMAL HIGH (ref 70–99)

## 2012-09-25 LAB — GLUCOSE, CAPILLARY
Glucose-Capillary: 240 mg/dL — ABNORMAL HIGH (ref 70–99)
Glucose-Capillary: 187 mg/dL — ABNORMAL HIGH (ref 70–99)

## 2012-09-28 LAB — GLUCOSE, CAPILLARY
Glucose-Capillary: 359 mg/dL — ABNORMAL HIGH (ref 70–99)
Glucose-Capillary: 306 mg/dL — ABNORMAL HIGH (ref 70–99)

## 2012-10-01 LAB — GLUCOSE, CAPILLARY: Glucose-Capillary: 171 mg/dL — ABNORMAL HIGH (ref 70–99)

## 2012-10-02 LAB — GLUCOSE, CAPILLARY
Glucose-Capillary: 199 mg/dL — ABNORMAL HIGH (ref 70–99)
Glucose-Capillary: 158 mg/dL — ABNORMAL HIGH (ref 70–99)

## 2012-10-04 LAB — GLUCOSE, CAPILLARY: Glucose-Capillary: 230 mg/dL — ABNORMAL HIGH (ref 70–99)

## 2012-10-05 ENCOUNTER — Encounter: Payer: Self-pay | Admitting: Vascular Surgery

## 2012-10-08 LAB — GLUCOSE, CAPILLARY
Glucose-Capillary: 220 mg/dL — ABNORMAL HIGH (ref 70–99)
Glucose-Capillary: 188 mg/dL — ABNORMAL HIGH (ref 70–99)

## 2012-10-09 LAB — GLUCOSE, CAPILLARY: Glucose-Capillary: 172 mg/dL — ABNORMAL HIGH (ref 70–99)

## 2012-10-10 LAB — GLUCOSE, CAPILLARY: Glucose-Capillary: 223 mg/dL — ABNORMAL HIGH (ref 70–99)

## 2012-10-12 LAB — GLUCOSE, CAPILLARY: Glucose-Capillary: 222 mg/dL — ABNORMAL HIGH (ref 70–99)

## 2012-10-15 LAB — GLUCOSE, CAPILLARY
Glucose-Capillary: 211 mg/dL — ABNORMAL HIGH (ref 70–99)
Glucose-Capillary: 166 mg/dL — ABNORMAL HIGH (ref 70–99)

## 2012-10-16 ENCOUNTER — Encounter (HOSPITAL_BASED_OUTPATIENT_CLINIC_OR_DEPARTMENT_OTHER): Payer: Medicare Other | Attending: General Surgery

## 2012-10-16 DIAGNOSIS — L97809 Non-pressure chronic ulcer of other part of unspecified lower leg with unspecified severity: Secondary | ICD-10-CM | POA: Insufficient documentation

## 2012-10-16 DIAGNOSIS — E1169 Type 2 diabetes mellitus with other specified complication: Secondary | ICD-10-CM | POA: Insufficient documentation

## 2012-10-16 LAB — GLUCOSE, CAPILLARY: Glucose-Capillary: 263 mg/dL — ABNORMAL HIGH (ref 70–99)

## 2012-10-17 LAB — GLUCOSE, CAPILLARY: Glucose-Capillary: 162 mg/dL — ABNORMAL HIGH (ref 70–99)

## 2012-10-18 LAB — GLUCOSE, CAPILLARY: Glucose-Capillary: 164 mg/dL — ABNORMAL HIGH (ref 70–99)

## 2012-10-19 LAB — GLUCOSE, CAPILLARY
Glucose-Capillary: 235 mg/dL — ABNORMAL HIGH (ref 70–99)
Glucose-Capillary: 191 mg/dL — ABNORMAL HIGH (ref 70–99)

## 2012-10-22 LAB — GLUCOSE, CAPILLARY: Glucose-Capillary: 234 mg/dL — ABNORMAL HIGH (ref 70–99)

## 2012-10-25 LAB — GLUCOSE, CAPILLARY
Glucose-Capillary: 256 mg/dL — ABNORMAL HIGH (ref 70–99)
Glucose-Capillary: 221 mg/dL — ABNORMAL HIGH (ref 70–99)

## 2012-10-25 NOTE — Progress Notes (Signed)
[facility]  NAME:  Nancy Glover, Nancy Glover NO.:  [id number]  MEDICAL RECORD NO.:  [id number]      DATE OF BIRTH:  Jun 10, 1934  PHYSICIAN:  Ardath Sax, M.D.           VISIT DATE:                                  OFFICE VISIT   This is a 79 year old lady who has a diabetic ulcer on her right lower leg, and we have been treating this with hyperbaric oxygen, Santyl, and we have also been putting on Grafix biologic dressings.  We have made great improvements and I think that the hyperbaric oxygen has had more to do with this than anything as far as curative.  Since the ulcer is doing so well, I want to continue the hyperbaric oxygen for another 30 treatments and so we are applying to Medicare to get their response for continuing the hyperbaric oxygen to heal over this diabetic ulcer.     Ardath Sax, M.D.     PP/MEDQ  D:  10/24/2012  T:  10/25/2012  Job:  [id number]

## 2012-10-26 LAB — GLUCOSE, CAPILLARY: Glucose-Capillary: 252 mg/dL — ABNORMAL HIGH (ref 70–99)

## 2012-10-29 ENCOUNTER — Encounter: Payer: Self-pay | Admitting: Vascular Surgery

## 2012-10-29 LAB — GLUCOSE, CAPILLARY
Glucose-Capillary: 296 mg/dL — ABNORMAL HIGH (ref 70–99)
Glucose-Capillary: 219 mg/dL — ABNORMAL HIGH (ref 70–99)

## 2012-10-30 ENCOUNTER — Encounter: Payer: Medicare Other | Admitting: Vascular Surgery

## 2012-10-30 ENCOUNTER — Ambulatory Visit (INDEPENDENT_AMBULATORY_CARE_PROVIDER_SITE_OTHER): Payer: Medicare Other | Admitting: Vascular Surgery

## 2012-10-30 ENCOUNTER — Encounter: Payer: Self-pay | Admitting: Vascular Surgery

## 2012-10-30 VITALS — BP 140/63 | HR 91 | Ht 63.0 in | Wt 160.7 lb

## 2012-10-30 DIAGNOSIS — L98499 Non-pressure chronic ulcer of skin of other sites with unspecified severity: Secondary | ICD-10-CM

## 2012-10-30 DIAGNOSIS — I739 Peripheral vascular disease, unspecified: Secondary | ICD-10-CM

## 2012-10-30 DIAGNOSIS — I7025 Atherosclerosis of native arteries of other extremities with ulceration: Secondary | ICD-10-CM | POA: Insufficient documentation

## 2012-10-30 NOTE — Progress Notes (Signed)
Subjective:     Patient ID: Nancy Glover, female   DOB: [birth year], 79 y.o.   MRN: [id number]  HPI this 79 year old female was referred by Dr. Jimmey Ralph in the wound center to evaluate circulation in the right lower extremity. The patient injured her right leg Christmas Eve 2013 when she bumped her leg on something and developed a large ulcerated area. This has been treated in the wound center with very slow improvement. She has been receiving hyperbaric oxygen treatments for the past 34 days and she thinks this has helped. He has no history of nonhealing ulcers in the contralateral left leg or spontaneous ulcers in the right leg. She denies claudication symptoms prior to this occurring. She does have type 2 diabetes mellitus. She denies numbness in the lower extremities.  Past Medical History  Diagnosis Date  . Hypertension   . Diabetes mellitus, type 2   . Ulcer of right leg   . Venous insufficiency, peripheral   . Polymyalgia arteritica     leg pain  . Macular degeneration of left eye   . PONV (postoperative nausea and vomiting)   . Hyperlipidemia     History  Substance Use Topics  . Smoking status: Former Smoker -- 1.00 packs/day for 40 years    Types: Cigarettes    Quit date: 09/13/1991  . Smokeless tobacco: Never Used  . Alcohol Use: No    Family History  Problem Relation Age of Onset  . Colon cancer Father   . Heart disease Father   . Hypertension Father   . Heart attack Father   . Cancer Mother   . Heart disease Mother     Allergies  Allergen Reactions  . Actos (Pioglitazone) Hives and Swelling    Body swelling  . Sulfa Antibiotics Swelling    Tongue and mouth swelling    Current outpatient prescriptions:acetaminophen (TYLENOL) 500 MG tablet, Take 1,000 mg by mouth every 6 (six) hours as needed (leg pain)., Disp: , Rfl: ;  ALPRAZolam (XANAX) 0.5 MG tablet, Take 0.5 mg by mouth at bedtime as needed for sleep., Disp: , Rfl: ;  amLODipine (NORVASC) 5 MG tablet, Take 5  mg by mouth every morning., Disp: , Rfl: ;  Calcium Carbonate-Vitamin D (CALCIUM + D PO), Take 600 mg by mouth daily., Disp: , Rfl:  furosemide (LASIX) 20 MG tablet, Take 20 mg by mouth daily as needed (leg swelling). , Disp: , Rfl: ;  losartan (COZAAR) 100 MG tablet, Take 100 mg by mouth every morning., Disp: , Rfl: ;  metFORMIN (GLUCOPHAGE) 500 MG tablet, Take 1,000 mg by mouth 2 (two) times daily with a meal. , Disp: , Rfl: ;  predniSONE (DELTASONE) 5 MG tablet, Take 5 mg by mouth every morning. Take with 1mg  tablet to make 6mg , Disp: , Rfl:  simvastatin (ZOCOR) 20 MG tablet, Take 20 mg by mouth every evening., Disp: , Rfl: ;  Cinnamon 500 MG TABS, Take 1 tablet by mouth daily., Disp: , Rfl: ;  cyanocobalamin (CVS VITAMIN B12) 1000 MCG tablet, Take 1 tablet (1,000 mcg total) by mouth daily., Disp: 30 tablet, Rfl: 0;  omeprazole (PRILOSEC OTC) 20 MG tablet, Take 20 mg by mouth every morning. , Disp: , Rfl:  oxyCODONE-acetaminophen (ROXICET) 5-325 MG per tablet, Take 1 tablet by mouth every 6 (six) hours as needed for pain., Disp: 30 tablet, Rfl: 0;  predniSONE (DELTASONE) 1 MG tablet, Take 1 mg by mouth every morning. Take with 5mg  tablet to make 6mg , Disp: ,  Rfl:   BP 140/63  Pulse 91  Ht 5\' 3"  (1.6 m)  Wt 160 lb 11.2 oz (72.893 kg)  BMI 28.47 kg/m2  SpO2 98%  Body mass index is 28.47 kg/(m^2).           Review of Systems denies chest pain, dyspnea on exertion, PND, orthopnea, hemoptysis, claudication, numbness     Objective:   Physical Exam pressure 140/63 heart rate 91 respirations 18 Gen.-alert and oriented x3 in no apparent distress HEENT normal for age Lungs no rhonchi or wheezing Cardiovascular regular rhythm no murmurs carotid pulses 3+ palpable no bruits audible Abdomen soft nontender no palpable masses Musculoskeletal free of  major deformities Skin clear -no rashes Neurologic normal Lower extremities 3+ femoral and dorsalis pedis pulses palpable bilaterally with no  edema Right lower extremity with dressing in place. Partially removed and appears to be an ulcerated nonhealing area on the lateral aspect of the right leg between the knee and the lateral malleolus midway. It measures approximately 5 x 2 cm. The biologic dressing was not removed from the wound. Left leg is free of ulceration  Lower extremity arterial Dopplers were performed in February of this year which I have reviewed. There is triphasic flow to both lower extremities with ABI in the right leg exceeding 1.0.       Assessment:     Traumatic injury right lower extremity in patient with type 2 diabetes mellitus Excellent arterial flow anterior tibial artery with palpable pulse and normal ABI on right Slow improvement with hyperbaric oxygen treatments and biologic dressings    Plan:     No indication for further vascular evaluation or intervention Return to wound center for chronic care

## 2012-10-31 ENCOUNTER — Encounter: Payer: Self-pay | Admitting: Internal Medicine

## 2012-11-01 LAB — GLUCOSE, CAPILLARY
Glucose-Capillary: 198 mg/dL — ABNORMAL HIGH (ref 70–99)
Glucose-Capillary: 202 mg/dL — ABNORMAL HIGH (ref 70–99)

## 2012-11-05 LAB — GLUCOSE, CAPILLARY
Glucose-Capillary: 290 mg/dL — ABNORMAL HIGH (ref 70–99)
Glucose-Capillary: 246 mg/dL — ABNORMAL HIGH (ref 70–99)

## 2012-11-06 LAB — GLUCOSE, CAPILLARY
Glucose-Capillary: 300 mg/dL — ABNORMAL HIGH (ref 70–99)
Glucose-Capillary: 266 mg/dL — ABNORMAL HIGH (ref 70–99)

## 2012-11-08 LAB — GLUCOSE, CAPILLARY: Glucose-Capillary: 381 mg/dL — ABNORMAL HIGH (ref 70–99)

## 2012-11-09 LAB — GLUCOSE, CAPILLARY
Glucose-Capillary: 261 mg/dL — ABNORMAL HIGH (ref 70–99)
Glucose-Capillary: 266 mg/dL — ABNORMAL HIGH (ref 70–99)

## 2012-11-15 ENCOUNTER — Encounter (HOSPITAL_BASED_OUTPATIENT_CLINIC_OR_DEPARTMENT_OTHER): Payer: Medicare Other | Attending: General Surgery

## 2012-11-15 DIAGNOSIS — E1169 Type 2 diabetes mellitus with other specified complication: Secondary | ICD-10-CM | POA: Insufficient documentation

## 2012-11-15 DIAGNOSIS — L97809 Non-pressure chronic ulcer of other part of unspecified lower leg with unspecified severity: Secondary | ICD-10-CM | POA: Insufficient documentation

## 2012-11-15 LAB — GLUCOSE, CAPILLARY: Glucose-Capillary: 371 mg/dL — ABNORMAL HIGH (ref 70–99)

## 2012-11-20 LAB — GLUCOSE, CAPILLARY
Glucose-Capillary: 255 mg/dL — ABNORMAL HIGH (ref 70–99)
Glucose-Capillary: 186 mg/dL — ABNORMAL HIGH (ref 70–99)

## 2012-11-21 LAB — GLUCOSE, CAPILLARY
Glucose-Capillary: 299 mg/dL — ABNORMAL HIGH (ref 70–99)
Glucose-Capillary: 249 mg/dL — ABNORMAL HIGH (ref 70–99)

## 2012-11-23 LAB — GLUCOSE, CAPILLARY
Glucose-Capillary: 253 mg/dL — ABNORMAL HIGH (ref 70–99)
Glucose-Capillary: 313 mg/dL — ABNORMAL HIGH (ref 70–99)

## 2012-11-26 LAB — GLUCOSE, CAPILLARY
Glucose-Capillary: 264 mg/dL — ABNORMAL HIGH (ref 70–99)
Glucose-Capillary: 279 mg/dL — ABNORMAL HIGH (ref 70–99)

## 2012-11-27 LAB — GLUCOSE, CAPILLARY: Glucose-Capillary: 248 mg/dL — ABNORMAL HIGH (ref 70–99)

## 2012-11-28 LAB — GLUCOSE, CAPILLARY
Glucose-Capillary: 212 mg/dL — ABNORMAL HIGH (ref 70–99)
Glucose-Capillary: 194 mg/dL — ABNORMAL HIGH (ref 70–99)

## 2012-11-29 LAB — GLUCOSE, CAPILLARY: Glucose-Capillary: 232 mg/dL — ABNORMAL HIGH (ref 70–99)

## 2012-11-30 LAB — GLUCOSE, CAPILLARY: Glucose-Capillary: 236 mg/dL — ABNORMAL HIGH (ref 70–99)

## 2012-12-04 LAB — GLUCOSE, CAPILLARY: Glucose-Capillary: 329 mg/dL — ABNORMAL HIGH (ref 70–99)

## 2012-12-05 LAB — GLUCOSE, CAPILLARY
Glucose-Capillary: 254 mg/dL — ABNORMAL HIGH (ref 70–99)
Glucose-Capillary: 285 mg/dL — ABNORMAL HIGH (ref 70–99)

## 2012-12-06 LAB — GLUCOSE, CAPILLARY: Glucose-Capillary: 278 mg/dL — ABNORMAL HIGH (ref 70–99)

## 2012-12-12 LAB — GLUCOSE, CAPILLARY: Glucose-Capillary: 154 mg/dL — ABNORMAL HIGH (ref 70–99)

## 2012-12-19 ENCOUNTER — Encounter (HOSPITAL_BASED_OUTPATIENT_CLINIC_OR_DEPARTMENT_OTHER): Payer: Medicare Other | Attending: General Surgery

## 2012-12-19 DIAGNOSIS — L97909 Non-pressure chronic ulcer of unspecified part of unspecified lower leg with unspecified severity: Secondary | ICD-10-CM | POA: Insufficient documentation

## 2012-12-20 NOTE — Progress Notes (Signed)
[facility]  NAME:  HUNTLEIGH, DOOLEN NO.:  [id number]  MEDICAL RECORD NO.:  [id number]      DATE OF BIRTH:  1934-01-02  PHYSICIAN:  Wayland Denis, DO       VISIT DATE:  12/19/2012                                  OFFICE VISIT   The patient is a 79 year old female who is here for followup on her right lower extremity ulcer.  She is doing much better than she had been previously.  She had graphics placed.  She is very tender and does not tolerate compression well at all, but she is improving.  She is alert, oriented, cooperative, not in any acute distress.  She is very pleasant. Pupils are equal.  Extraocular muscles intact.  No cervical lymphadenopathy.  Her breathing is unlabored.  Her heart is regular. Her abdomen is soft.  We will do an Endoform today.  I did debride and those notes are noted and we will see her back in followup in 1 week.     Wayland Denis, DO     CS/MEDQ  D:  12/19/2012  T:  12/20/2012  Job:  [id number]

## 2013-01-16 ENCOUNTER — Encounter (HOSPITAL_BASED_OUTPATIENT_CLINIC_OR_DEPARTMENT_OTHER): Payer: Medicare Other | Attending: General Surgery

## 2013-01-16 DIAGNOSIS — L97909 Non-pressure chronic ulcer of unspecified part of unspecified lower leg with unspecified severity: Secondary | ICD-10-CM | POA: Insufficient documentation

## 2013-01-19 ENCOUNTER — Emergency Department (HOSPITAL_COMMUNITY)
Admission: EM | Admit: 2013-01-19 | Discharge: 2013-01-19 | Disposition: A | Discharge status: Home / Self Care | Payer: Medicare Other | Attending: Emergency Medicine | Admitting: Emergency Medicine

## 2013-01-19 ENCOUNTER — Encounter (HOSPITAL_COMMUNITY): Payer: Self-pay | Admitting: Emergency Medicine

## 2013-01-19 ENCOUNTER — Emergency Department (HOSPITAL_COMMUNITY): Payer: Medicare Other

## 2013-01-19 DIAGNOSIS — H3530 Unspecified macular degeneration: Secondary | ICD-10-CM | POA: Insufficient documentation

## 2013-01-19 DIAGNOSIS — R296 Repeated falls: Secondary | ICD-10-CM | POA: Insufficient documentation

## 2013-01-19 DIAGNOSIS — I872 Venous insufficiency (chronic) (peripheral): Secondary | ICD-10-CM | POA: Insufficient documentation

## 2013-01-19 DIAGNOSIS — M316 Other giant cell arteritis: Secondary | ICD-10-CM | POA: Insufficient documentation

## 2013-01-19 DIAGNOSIS — E1159 Type 2 diabetes mellitus with other circulatory complications: Secondary | ICD-10-CM | POA: Insufficient documentation

## 2013-01-19 DIAGNOSIS — Y929 Unspecified place or not applicable: Secondary | ICD-10-CM | POA: Insufficient documentation

## 2013-01-19 DIAGNOSIS — Z87891 Personal history of nicotine dependence: Secondary | ICD-10-CM | POA: Insufficient documentation

## 2013-01-19 DIAGNOSIS — L97909 Non-pressure chronic ulcer of unspecified part of unspecified lower leg with unspecified severity: Secondary | ICD-10-CM | POA: Insufficient documentation

## 2013-01-19 DIAGNOSIS — I10 Essential (primary) hypertension: Secondary | ICD-10-CM | POA: Insufficient documentation

## 2013-01-19 DIAGNOSIS — E785 Hyperlipidemia, unspecified: Secondary | ICD-10-CM | POA: Insufficient documentation

## 2013-01-19 DIAGNOSIS — Z79899 Other long term (current) drug therapy: Secondary | ICD-10-CM | POA: Insufficient documentation

## 2013-01-19 DIAGNOSIS — Z888 Allergy status to other drugs, medicaments and biological substances status: Secondary | ICD-10-CM | POA: Insufficient documentation

## 2013-01-19 DIAGNOSIS — S81812A Laceration without foreign body, left lower leg, initial encounter: Secondary | ICD-10-CM

## 2013-01-19 DIAGNOSIS — Y998 Other external cause status: Secondary | ICD-10-CM | POA: Insufficient documentation

## 2013-01-19 DIAGNOSIS — S81009A Unspecified open wound, unspecified knee, initial encounter: Secondary | ICD-10-CM | POA: Insufficient documentation

## 2013-01-19 DIAGNOSIS — Z882 Allergy status to sulfonamides status: Secondary | ICD-10-CM | POA: Insufficient documentation

## 2013-01-19 DIAGNOSIS — S81802A Unspecified open wound, left lower leg, initial encounter: Secondary | ICD-10-CM

## 2013-01-19 MED ORDER — MORPHINE SULFATE 4 MG/ML IJ SOLN
4.0000 mg | Freq: Once | INTRAMUSCULAR | Status: AC
  Administered 2013-01-19: 4 mg via INTRAVENOUS
  Filled 2013-01-19: qty 1

## 2013-01-19 MED ORDER — ONDANSETRON HCL 4 MG/2ML IJ SOLN
4.0000 mg | Freq: Once | INTRAMUSCULAR | Status: AC
  Administered 2013-01-19: 4 mg via INTRAVENOUS
  Filled 2013-01-19: qty 2

## 2013-01-19 MED ORDER — AMOXICILLIN-POT CLAVULANATE 875-125 MG PO TABS: 1.0000 | ORAL_TABLET | Freq: Two times a day (BID) | ORAL | Status: DC

## 2013-01-19 MED ORDER — HYDROCODONE-ACETAMINOPHEN 5-325 MG PO TABS: 1.0000 | ORAL_TABLET | Freq: Four times a day (QID) | ORAL | Status: DC | PRN

## 2013-01-19 NOTE — ED Provider Notes (Signed)
Medical screening examination/treatment/procedure(s) were conducted as a shared visit with non-physician practitioner(s) and myself.  I personally evaluated the patient during the encounter  Nancy Glover is a 79 y.o. female here s/p mechanical fall while going to an [employer], [employer]. Tetanus up to date. No head injury. Large L anterior tibial skin laceration with exposed muscle. Plantar flexion and dorsiflexion intact. 2+ DP pulses. We irrigated the wound extensively. We placed 10 subcutaneous sutures so that the muscles are not exposed. Unfortunately she is missing a part of the skin so we placed some xeroform and have her f/u with wound care doctor. She is d/c home on augmentin.    Richardean Canal, MD 01/19/13 [id number]

## 2013-01-19 NOTE — ED Notes (Signed)
Pain is 0 out of 10

## 2013-01-19 NOTE — ED Notes (Signed)
 fentanyl given by ems

## 2013-01-19 NOTE — ED Notes (Signed)
Returned from xray

## 2013-01-19 NOTE — ED Notes (Signed)
Pt fell over a step, hitting her left leg  With a lac from ankle to mid calf bleeding is controled

## 2013-01-19 NOTE — ED Provider Notes (Signed)
History    CSN: [id number] Arrival date & time 01/19/13  0915  First MD Initiated Contact with Patient 01/19/13 [phone]     Chief Complaint  Patient presents with  . Fall  . Leg Injury   (Consider location/radiation/quality/duration/timing/severity/associated sxs/prior Treatment) HPI  Patient presents emergency department following a fall with a large wound to her left lower leg.  Patient, states she has a nonhealing wound to the right lower leg that she is seeing wound care for at this time.  Patient denies any other injury.  She states she did not hit her head.  Patient denies chest pain, shortness of breath, nausea, vomiting, diarrhea, abdominal pain, blurred vision, dizziness, weakness, numbness, or syncope.  Patient, states, that she had called EMS to the large wound.  EMS bandaged the wound prior to her arrival.  Patient, states, that palpation makes her pain, worse.  Past Medical History  Diagnosis Date  . Hypertension   . Diabetes mellitus, type 2   . Ulcer of right leg   . Venous insufficiency, peripheral   . Polymyalgia arteritica     leg pain  . Macular degeneration of left eye   . PONV (postoperative nausea and vomiting)   . Hyperlipidemia    Past Surgical History  Procedure Laterality Date  . Cervical biopsy  w/ loop electrode excision  01-17-2001  DR LOMAX  . Cysto/ hod/ bladder bx  10-23-2006  DR PETERSON  . Cataract extraction w/ intraocular lens implant Right   . Esophagogastroduodenoscopy N/A 09/15/2012    Procedure: ESOPHAGOGASTRODUODENOSCOPY (EGD);  Surgeon: Vertell Novak., MD;  Location: [facility];  Service: Endoscopy;  Laterality: N/A;   Family History  Problem Relation Age of Onset  . Colon cancer Father   . Heart disease Father   . Hypertension Father   . Heart attack Father   . Cancer Mother   . Heart disease Mother    History  Substance Use Topics  . Smoking status: Former Smoker -- 1.00 packs/day for 40 years    Types: Cigarettes   Quit date: 09/13/1991  . Smokeless tobacco: Never Used  . Alcohol Use: No   OB History   Grav Para Term Preterm Abortions TAB SAB Ect Mult Living                 Review of Systems All other systems negative except as documented in the HPI. All pertinent positives and negatives as reviewed in the HPI. Allergies  Actos and Sulfa antibiotics  Home Medications   Current Outpatient Rx  Name  Route  Sig  Dispense  Refill  . acetaminophen (TYLENOL) 500 MG tablet   Oral   Take 1,000 mg by mouth every 6 (six) hours as needed (leg pain).         . Calcium Carbonate-Vitamin D (CALCIUM + D PO)   Oral   Take 600 mg by mouth daily.         . metFORMIN (GLUCOPHAGE) 1000 MG tablet   Oral   Take 1,000 mg by mouth 2 (two) times daily with a meal.         . omeprazole (PRILOSEC OTC) 20 MG tablet   Oral   Take 20 mg by mouth every morning.          . predniSONE (DELTASONE) 1 MG tablet   Oral   Take 1 mg by mouth every morning. Take with 5mg  tablet to make 6mg          .  predniSONE (DELTASONE) 5 MG tablet   Oral   Take 5 mg by mouth every morning. Take with 1mg  tablet to make 6mg          . simvastatin (ZOCOR) 40 MG tablet   Oral   Take 40 mg by mouth every evening.         Marland Kitchen VITAMIN B1-B12 IM   Intramuscular   Inject 1,000 mcg/mL into the muscle every 30 (thirty) days.         Marland Kitchen amLODipine (NORVASC) 5 MG tablet   Oral   Take 5 mg by mouth every morning.         . cefdinir (OMNICEF) 300 MG capsule   Oral   Take 300 mg by mouth 2 (two) times daily.         . ciprofloxacin (CIPRO) 500 MG tablet   Oral   Take 500 mg by mouth 2 (two) times daily.         . furosemide (LASIX) 20 MG tablet   Oral   Take 20 mg by mouth daily as needed (leg swelling).          Marland Kitchen levofloxacin (LEVAQUIN) 750 MG tablet   Oral   Take 750 mg by mouth daily.         Marland Kitchen losartan (COZAAR) 100 MG tablet   Oral   Take 100 mg by mouth every morning.         . trimethoprim  (TRIMPEX) 100 MG tablet   Oral   Take 100 mg by mouth daily as needed.         . vitamin A 16109 UNIT capsule   Oral   Take 10,000 Units by mouth daily.          BP 140/63  Pulse 61  Temp(Src) 97.1 F (36.2 C) (Oral)  Resp 20  SpO2 90% Physical Exam  Nursing note and vitals reviewed. Constitutional: She is oriented to person, place, and time. She appears well-developed and well-nourished. No distress.  HENT:  Head: Normocephalic and atraumatic.  Mouth/Throat: Oropharynx is clear and moist.  Eyes: Pupils are equal, round, and reactive to light.  Neck: Normal range of motion. Neck supple.  Cardiovascular: Normal rate, regular rhythm and normal heart sounds.  Exam reveals no gallop and no friction rub.   No murmur heard. Pulmonary/Chest: Effort normal and breath sounds normal.  Musculoskeletal:       Left lower leg: She exhibits tenderness and laceration.       Legs: Neurological: She is alert and oriented to person, place, and time. Coordination normal.  Skin: Skin is warm and dry.    ED Course  Procedures (including critical care time) Labs Reviewed  GLUCOSE, CAPILLARY - Abnormal; Notable for the following:    Glucose-Capillary 130 (*)    All other components within normal limits   Dg Tibia/fibula Left  01/19/2013   *RADIOLOGY REPORT*  Clinical Data: Fall with lower leg laceration.  LEFT TIBIA AND FIBULA - 2 VIEW  Comparison: 10/27/2011 knee radiographs  Findings: Bandaging overlies soft tissue disruption along the lateral lower portion of the lower leg.  Aside from the bandaging, no foreign body is identified.  No underlying fracture observed.  IMPRESSION:  1.  Anterolateral laceration of the lower leg.  Aside from the bandaging, no foreign body is observed.  No underlying fracture noted.   Original Report Authenticated By: Gaylyn Rong, M.D.  LACERATION REPAIR Performed by: Carlyle Dolly Authorized by: Carlyle Dolly Consent: Verbal consent  obtained. Risks and benefits: risks, benefits and alternatives were discussed Consent given by: patient Patient identity confirmed: provided demographic data Prepped and Draped in normal sterile fashion Wound explored  Laceration Location: Left lower anterior leg  Laceration Length: 15 cm  No Foreign Bodies seen or palpated  Anesthesia: local infiltration  Local anesthetic: lidocaine 2% without epinephrine  Anesthetic total: 10 ml  Irrigation method: syringe Amount of cleaning: standard  Skin closure: We did 10 subcutaneous sutures in the area where there is a skin defect to cover the deeper structures at the base of the wound near the most inferior skin tear, sutured, along both sides.  One suture on the left side of the wound and have him on the right if facing the patient    Number of sutures:  Technique: Simple interrupted   Patient tolerance: Patient tolerated the procedure well with no immediate complications.  Patient is a very large skin defect in she is diabetic and was warned that this take a long time to heal and will need follow up for wound care Dr. patient understands to keep area clean and dry.  Patient is advised to return here for any worsening in her condition.  Advised her to elevate the area.  Patient is warned of the risk of infection especially since she is diabetic.  Patient.  Voices an understanding MDM    Carlyle Dolly, PA-C 01/19/13 1255

## 2013-01-19 NOTE — ED Notes (Signed)
Md at bedside suturing wound

## 2013-01-28 NOTE — Progress Notes (Signed)
[facility]  NAME:  Nancy Glover, Nancy Glover                ACCOUNT NO.:  [id number]  MEDICAL RECORD NO.:  [id number]      DATE OF BIRTH:  [birth year]  PHYSICIAN:  Wayland Denis, DO       VISIT DATE:  01/28/2013                                  OFFICE VISIT   The patient is a 79 year old female who is here for evaluation on her left leg ulcer.  Her right leg is completely healed.  Unfortunately, she fell again recently and injured her left anterior leg.  She came to the office and we put ACell on it, which seems to be helping, but the ACell has dry out a little bit.  She has no other issues.  She does complain of some pain.  REVIEW OF SYSTEMS:  Otherwise negative.  MEDICATIONS:  She has finished the antibiotics.  PHYSICAL EXAMINATION:  On exam, she is alert, oriented, cooperative, not in any acute distress.  She is pleasant.  Pupils are equal.  Extraocular muscles are intact.  No cervical lymphadenopathy.  Her breathing is unlabored.  Her heart is regular.  Her abdomen is soft.  The lower extremity, right completely healed.  The left, ACell had dried out little bit.  So, we will continue with the Hydrogel.  She needs to do that at home every other day.  She can coming here for dressing change and then we will see her back in 1 week.     Wayland Denis, DO     CS/MEDQ  D:  01/28/2013  T:  01/28/2013  Job:  [id number]

## 2013-02-20 ENCOUNTER — Other Ambulatory Visit: Payer: Self-pay

## 2013-02-20 ENCOUNTER — Encounter (HOSPITAL_BASED_OUTPATIENT_CLINIC_OR_DEPARTMENT_OTHER): Payer: Medicare Other | Attending: General Surgery

## 2013-02-20 DIAGNOSIS — I890 Lymphedema, not elsewhere classified: Secondary | ICD-10-CM | POA: Insufficient documentation

## 2013-02-20 DIAGNOSIS — W19XXXA Unspecified fall, initial encounter: Secondary | ICD-10-CM | POA: Insufficient documentation

## 2013-02-20 DIAGNOSIS — I87309 Chronic venous hypertension (idiopathic) without complications of unspecified lower extremity: Secondary | ICD-10-CM | POA: Insufficient documentation

## 2013-02-20 DIAGNOSIS — E119 Type 2 diabetes mellitus without complications: Secondary | ICD-10-CM | POA: Insufficient documentation

## 2013-02-20 DIAGNOSIS — S81009A Unspecified open wound, unspecified knee, initial encounter: Secondary | ICD-10-CM | POA: Insufficient documentation

## 2013-03-16 NOTE — Progress Notes (Signed)
[facility]  NAME:  Nancy Glover, Nancy Glover NO.:  [id number]  MEDICAL RECORD NO.:  [id number]      DATE OF BIRTH:  Nov 07, 1933  PHYSICIAN:  Ardath Sax, M.D.           VISIT DATE:                                  OFFICE VISIT   This is a very pleasant 79 year old lady who, for the most part, is quite active.  She has had problems with her left leg currently after suffering a fall and suffering avulsion of the tissue on the anterior aspect of her left leg.  This initially was repaired in the emergency room and later all the skin sloughed off.  She has an area about 12-cm long and 5-cm wide on the anterior aspect of her left leg that all of the subcu and skin has gone from the avulsion.  You can see the leg muscles and the fascia that we have been debriding and using Santyl dressings.  This lady has a good palpable pulse.  She is a type 2 diabetic, but she takes very good care of herself.  Her blood sugars are normal and her A1c is normal.  She, I believe, would benefit from a skin graft and hyperbaric oxygen while the graft is attempting to heal.  She has a great deal of venous hypertension and some lymphedema that would curtail her ability to heal nicely.  I would like to apply this time for 30 treatments of hyperbaric oxygen while we were attempting to get this wound heal.  I think we have got a good chance right now and if this progresses anymore, I think there is a possibility for a loss of limb.     Ardath Sax, M.D.     PP/MEDQ  D:  03/15/2013  T:  03/16/2013  Job:  [id number]

## 2013-03-20 ENCOUNTER — Ambulatory Visit (INDEPENDENT_AMBULATORY_CARE_PROVIDER_SITE_OTHER): Payer: Medicare Other | Admitting: Surgery

## 2013-03-20 ENCOUNTER — Encounter (INDEPENDENT_AMBULATORY_CARE_PROVIDER_SITE_OTHER): Payer: Self-pay | Admitting: Surgery

## 2013-03-20 ENCOUNTER — Encounter (HOSPITAL_BASED_OUTPATIENT_CLINIC_OR_DEPARTMENT_OTHER): Payer: Medicare Other | Attending: General Surgery

## 2013-03-20 VITALS — BP 130/76 | HR 74 | Resp 16 | Ht 63.0 in | Wt 148.0 lb

## 2013-03-20 DIAGNOSIS — S8990XA Unspecified injury of unspecified lower leg, initial encounter: Secondary | ICD-10-CM | POA: Insufficient documentation

## 2013-03-20 DIAGNOSIS — I890 Lymphedema, not elsewhere classified: Secondary | ICD-10-CM | POA: Insufficient documentation

## 2013-03-20 DIAGNOSIS — W19XXXA Unspecified fall, initial encounter: Secondary | ICD-10-CM | POA: Insufficient documentation

## 2013-03-20 DIAGNOSIS — I87309 Chronic venous hypertension (idiopathic) without complications of unspecified lower extremity: Secondary | ICD-10-CM | POA: Insufficient documentation

## 2013-03-20 DIAGNOSIS — S81009A Unspecified open wound, unspecified knee, initial encounter: Secondary | ICD-10-CM

## 2013-03-20 DIAGNOSIS — S81002A Unspecified open wound, left knee, initial encounter: Secondary | ICD-10-CM

## 2013-03-20 NOTE — Patient Instructions (Addendum)
Skin Grafting  Skin-grafting is a procedure in which a patch of healthy skin is removed from one part of your body and moved to another part of your body which has no skin. It is done under an anesthetic. This means medications are used to make you pain free while healthy skin is removed. The place where skin is removed is called the donor site. The skin is removed by use of a dermatome. A dermatome is a skin-cutting instrument which removes a very thin layer of skin. This is called a split-thickness skin graft. The graft contains the epidermis (top skin layer) and a portion of the dermis. The dermis is the part of the skin which contains blood vessels, nerves, hair follicles, oil and sweat glands. The dermis left behind in the donor area is able to grow back new skin. The donor site will be tender until the new skin grows back. The tenderness comes from the nerves which have been uncovered with the skin removal.  The donor site can be taken from any area of the body. It is usually an area that is hidden by clothes. Common sites are the buttocks or thighs. The site picked depends partly on the visibility of the donor skin and color match. The graft is carefully spread on the bare area to be covered. It is held in place either by gentle pressure from a padded dressing or by a few small stitches. The raw donor area is covered with a sterile nonstick dressing for 3-5 days. This provides comfort and protects the open skin from infection.  For more extensive tissue loss, a full-thickness skin graft may be necessary. This includes the entire thickness of the skin. Common donor sites include flaps from the back or abdominal wall. Full-thickness grafts are used when a lot of tissue is lost. This sometimes happens with open fractures of the lower leg. Full-thickness graft uses both layers (epidermis and dermis) of the skin. Benefits are better contour, more natural color, and less reduction at the grafted site. Disadvantage  of full-thickness skin grafts is that the wound at the donor site is larger and requires more careful management. Sometimes a split-thickness graft must be used to cover the donor site.  Other types of skin grafts include skin taken from a cadaver or from an animal. It is called an allograft when skin is taken from one person and grafted to another person. It is called a xenograft when skin is taken from one species (such as a pig) and transplanted to another species (a human). Disadvantages of these grafts are that the body may reject them. Rejection means that your body tries to get rid of the skin. Artificial skin products are available which can be used and the body will not reject these.  INDICATIONS  Skin grafts may be recommended for:  · Large wounds.  · Surgeries which require skin grafts for healing.  · Any areas of skin loss such as burns.  · Cosmetic reasons on reconstructive surgeries.  · The advantages of rapid skin grafting are that they help prevent fluid loss in the case of burns and they provide a sterile covering which helps prevent infection.  RISKS AND COMPLICATIONS  · Infection.  · Loss of grafted skin.  · Bleeding.  · Reaction to anesthesia.  · Getting an infectious disease if the graft is from someone else (an allograft).  PROGNOSIS   New blood vessels begin growing from the recipient area into the transplanted skin within 36   repeat grafting. Scarring can occur in large wounds. If this is over a joint it can limit the movement of the joint. Poor result from grafting can result in poor cosmetic results. This means it may not look as good as you would like. RECOVERY  Recovery from grafting is usually quick following split thickness grafting. The skin graft must be protected from injury or stretching for 2-3 weeks. Depending on the location of the graft, a dressing may be necessary for 1-2 weeks or  as directed. Avoid all exercises that could stretch or injure the graft for at least one month or as directed by your surgeon. Full-thickness grafts require a longer period of recovery. A hospital stay is most often necessary.  Grafted areas and donor sites should be protected from the sun with clothes or sunscreen. Sun exposure should be avoided until both areas are well healed.  Grafting sites are always a little more likely to get injured than your original equipment. Protect them always. Document Released: 03/03/2005 Document Revised: 09/26/2011 Document Reviewed: 08/20/2007 [employer] Patient Information 2014 [employer].  

## 2013-03-20 NOTE — Progress Notes (Signed)
Chief Complaint:  14 x 4 cm pretibial tissue loss after fall with slow healing ulcer  History of Present Illness:  Nancy Glover is an 79 y.o. female fell at a yard sale on July 5sustaining an open pretibial wall thickness skin loss and has been treated at the wound center by Dr. Gennette Pac. He spoke with me about her need for a skin graft and asked me to see her for this. She presents today and I measured this to be about 14 cm x 4 cm wide. After 2 months it has good beefy granulation tissue at its base which I think could take a skin graft. I discussed doing that with her unlikely take a panel of skin from her thigh. Another option might be to take a full-thickness panel from her abdomen which I could close primarily.  Past Medical History  Diagnosis Date  . Hypertension   . Diabetes mellitus, type 2   . Ulcer of right leg   . Venous insufficiency, peripheral   . Polymyalgia arteritica     leg pain  . Macular degeneration of left eye   . PONV (postoperative nausea and vomiting)   . Hyperlipidemia     Past Surgical History  Procedure Laterality Date  . Cervical biopsy  w/ loop electrode excision  01-17-2001  DR LOMAX  . Cysto/ hod/ bladder bx  10-23-2006  DR PETERSON  . Cataract extraction w/ intraocular lens implant Right   . Esophagogastroduodenoscopy N/A 09/15/2012    Procedure: ESOPHAGOGASTRODUODENOSCOPY (EGD);  Surgeon: Vertell Novak., MD;  Location: [facility];  Service: Endoscopy;  Laterality: N/A;    Current Outpatient Prescriptions  Medication Sig Dispense Refill  . acetaminophen (TYLENOL) 500 MG tablet Take 1,000 mg by mouth every 6 (six) hours as needed (leg pain).      Marland Kitchen amLODipine (NORVASC) 5 MG tablet Take 5 mg by mouth every morning.      Marland Kitchen amoxicillin-clavulanate (AUGMENTIN) 875-125 MG per tablet Take 1 tablet by mouth every 12 (twelve) hours.  14 tablet  0  . Calcium Carbonate-Vitamin D (CALCIUM + D PO) Take 600 mg by mouth daily.      . cefdinir (OMNICEF) 300  MG capsule Take 300 mg by mouth 2 (two) times daily.      . cephALEXin (KEFLEX) 500 MG capsule       . HYDROcodone-acetaminophen (NORCO/VICODIN) 5-325 MG per tablet Take 1 tablet by mouth every 6 (six) hours as needed for pain.  15 tablet  0  . losartan (COZAAR) 100 MG tablet Take 100 mg by mouth every morning.      . metFORMIN (GLUCOPHAGE) 1000 MG tablet Take 1,000 mg by mouth 2 (two) times daily with a meal.      . omeprazole (PRILOSEC OTC) 20 MG tablet Take 20 mg by mouth every morning.       . predniSONE (DELTASONE) 1 MG tablet Take 1 mg by mouth every morning. Take with 5mg  tablet to make 6mg       . predniSONE (DELTASONE) 5 MG tablet Take 5 mg by mouth every morning. Take with 1mg  tablet to make 6mg       . SANTYL ointment       . simvastatin (ZOCOR) 40 MG tablet Take 40 mg by mouth every evening.      . trimethoprim (TRIMPEX) 100 MG tablet Take 100 mg by mouth daily as needed.      . vitamin A 16109 UNIT capsule Take 10,000 Units by mouth  daily.      Marland Kitchen VITAMIN B1-B12 IM Inject 1,000 mcg/mL into the muscle every 30 (thirty) days.      . furosemide (LASIX) 20 MG tablet Take 20 mg by mouth daily as needed (leg swelling).        No current facility-administered medications for this visit.   Actos and Sulfa antibiotics Family History  Problem Relation Age of Onset  . Colon cancer Father   . Heart disease Father   . Hypertension Father   . Heart attack Father   . Cancer Mother   . Heart disease Mother    Social History:   reports that she quit smoking about 21 years ago. Her smoking use included Cigarettes. She has a 40 pack-year smoking history. She has never used smokeless tobacco. She reports that she does not drink alcohol or use illicit drugs.   REVIEW OF SYSTEMS - PERTINENT POSITIVES ONLY: Negative for DVT. She's had a previous injury to her right pretibial region there required hypobaric oxygen.  Physical Exam:   Blood pressure 130/76, pulse 74, resp. rate 16, height 5\' 3"   (1.6 m), weight 148 lb (67.132 kg). Body mass index is 26.22 kg/(m^2).  Gen:  WDWN white female NAD  Neurological: Alert and oriented to person, place, and time. Motor and sensory function is grossly intact  Head: Normocephalic and atraumatic.  Eyes: Conjunctivae are normal. Pupils are equal, round, and reactive to light. No scleral icterus.  Neck: Normal range of motion. Neck supple. No tracheal deviation or thyromegaly present.  Cardiovascular:  SR without murmurs or gallops.  No carotid bruits Lymphadenopathy: No cervical, preauricular, postauricular or axillary adenopathy is present Skin: Skin is warm and dry. No rash noted. No diaphoresis. No erythema. No pallor. Pscyh: Normal mood and affect. Behavior is normal. Judgment and thought content normal.    LABORATORY RESULTS: No results found for this or any previous visit (from the past 48 hour(s)).  RADIOLOGY RESULTS: No results found.  Problem List: Patient Active Problem List   Diagnosis Date Noted  . Peripheral vascular disease, unspecified 10/30/2012  . Atherosclerosis of native arteries of the extremities with ulceration(440.23) 10/30/2012  . Vitamin B12 deficiency anemia 09/17/2012  . Hypertension 09/14/2012  . Diabetes mellitus 09/14/2012  . Ulcer of right leg 09/14/2012  . Anemia 09/14/2012    Assessment & Plan: Granulation tissue on the left pretibial region.  Will schedule for skin graft.      Matt B. Daphine Deutscher, MD, [facility] [phone] beeper [phone]  03/20/2013 11:55 AM

## 2013-03-21 NOTE — Progress Notes (Signed)
[facility]  NAME:  CARIS, CERVENY NO.:  [id number]  MEDICAL RECORD NO.:  [id number]      DATE OF BIRTH:  October 18, 1933  PHYSICIAN:  Ardath Sax, M.D.           VISIT DATE:                                  OFFICE VISIT   Nancy Glover is a 79 year old lady who unfortunately fell several months ago and suffered avulsion of the tissue in the anterior aspect of her right leg.  This was initially repaired in the emergency room, and later, all of the skin from this sutured wound sloughed off on the anterior aspect of her right leg.  This area is about 12-15 cm long and 4-5 cm wide on the anterior aspect of the leg.  It is down to the muscle of the leg and you can easily see the anterior muscles over the tibia. She does have a palpable pulse in the foot.  Her blood sugars have been normal and she is taking good care of her diabetes.  I feel that the only way I can heal this is with a skin graft and in order to help facilitate the take of the skin graft hyperbaric oxygen would be extremely helpful for this older lady to heal the wound and perhaps even save her extremity.  She has quite a bit of venous hypertension and some lymphedema that I think treating her with the skin graft in conjunction with hyperbaric oxygen would help facilitate her healing.     Ardath Sax, M.D.     PP/MEDQ  D:  03/20/2013  T:  03/21/2013  Job:  [id number]

## 2013-03-27 ENCOUNTER — Encounter (HOSPITAL_BASED_OUTPATIENT_CLINIC_OR_DEPARTMENT_OTHER): Payer: Medicare Other

## 2013-04-05 ENCOUNTER — Telehealth (INDEPENDENT_AMBULATORY_CARE_PROVIDER_SITE_OTHER): Payer: Self-pay | Admitting: General Surgery

## 2013-04-05 NOTE — Telephone Encounter (Signed)
Charlynne Pander, nurse with [facility], called to state that this new pt of Dr. Daphine Deutscher has been "discharged" from the Wound Care center.  She will send any ongoing orders to Dr. Daphine Deutscher to sign.  They understand the pt has been evaluated in the office and will have surgery on 04/19/13.  Gave the FAX number to her and suggested she make it to Attn:  Meagen to streamline the orders.  Nurse understands.

## 2013-04-15 ENCOUNTER — Encounter (HOSPITAL_BASED_OUTPATIENT_CLINIC_OR_DEPARTMENT_OTHER): Payer: Self-pay | Admitting: *Deleted

## 2013-04-15 NOTE — Progress Notes (Signed)
To come in for bmet 

## 2013-04-17 ENCOUNTER — Encounter (HOSPITAL_BASED_OUTPATIENT_CLINIC_OR_DEPARTMENT_OTHER)
Admission: RE | Admit: 2013-04-17 | Discharge: 2013-04-17 | Disposition: A | Discharge status: Home / Self Care | Payer: Medicare Other | Source: Ambulatory Visit | Attending: Surgery | Admitting: Surgery

## 2013-04-17 LAB — BASIC METABOLIC PANEL
BUN: 15 mg/dL (ref 6–23)
GFR calc non Af Amer: 56 mL/min — ABNORMAL LOW (ref >90)
Glucose, Bld: 148 mg/dL — ABNORMAL HIGH (ref 70–99)
Potassium: 4.4 mEq/L (ref 3.5–5.1)

## 2013-04-19 ENCOUNTER — Encounter (HOSPITAL_BASED_OUTPATIENT_CLINIC_OR_DEPARTMENT_OTHER)
Admission: RE | Disposition: A | Discharge status: Home / Self Care | Payer: Self-pay | Source: Ambulatory Visit | Attending: Surgery

## 2013-04-19 ENCOUNTER — Encounter (HOSPITAL_BASED_OUTPATIENT_CLINIC_OR_DEPARTMENT_OTHER): Payer: Self-pay | Admitting: Anesthesiology

## 2013-04-19 ENCOUNTER — Telehealth (INDEPENDENT_AMBULATORY_CARE_PROVIDER_SITE_OTHER): Payer: Self-pay

## 2013-04-19 ENCOUNTER — Encounter (HOSPITAL_BASED_OUTPATIENT_CLINIC_OR_DEPARTMENT_OTHER): Payer: Self-pay | Admitting: *Deleted

## 2013-04-19 ENCOUNTER — Ambulatory Visit (HOSPITAL_BASED_OUTPATIENT_CLINIC_OR_DEPARTMENT_OTHER)
Admission: RE | Admit: 2013-04-19 | Discharge: 2013-04-19 | Disposition: A | Discharge status: Home / Self Care | Payer: Medicare Other | Source: Ambulatory Visit | Attending: Surgery | Admitting: Surgery

## 2013-04-19 ENCOUNTER — Ambulatory Visit (HOSPITAL_BASED_OUTPATIENT_CLINIC_OR_DEPARTMENT_OTHER): Payer: Medicare Other | Admitting: Anesthesiology

## 2013-04-19 DIAGNOSIS — Z87891 Personal history of nicotine dependence: Secondary | ICD-10-CM | POA: Insufficient documentation

## 2013-04-19 DIAGNOSIS — E119 Type 2 diabetes mellitus without complications: Secondary | ICD-10-CM

## 2013-04-19 DIAGNOSIS — IMO0002 Reserved for concepts with insufficient information to code with codable children: Secondary | ICD-10-CM | POA: Insufficient documentation

## 2013-04-19 DIAGNOSIS — E785 Hyperlipidemia, unspecified: Secondary | ICD-10-CM | POA: Insufficient documentation

## 2013-04-19 DIAGNOSIS — W19XXXA Unspecified fall, initial encounter: Secondary | ICD-10-CM | POA: Insufficient documentation

## 2013-04-19 DIAGNOSIS — S81009A Unspecified open wound, unspecified knee, initial encounter: Secondary | ICD-10-CM

## 2013-04-19 DIAGNOSIS — L97909 Non-pressure chronic ulcer of unspecified part of unspecified lower leg with unspecified severity: Secondary | ICD-10-CM | POA: Insufficient documentation

## 2013-04-19 DIAGNOSIS — I739 Peripheral vascular disease, unspecified: Secondary | ICD-10-CM | POA: Insufficient documentation

## 2013-04-19 DIAGNOSIS — S91009A Unspecified open wound, unspecified ankle, initial encounter: Secondary | ICD-10-CM

## 2013-04-19 DIAGNOSIS — H3530 Unspecified macular degeneration: Secondary | ICD-10-CM | POA: Insufficient documentation

## 2013-04-19 DIAGNOSIS — I872 Venous insufficiency (chronic) (peripheral): Secondary | ICD-10-CM | POA: Insufficient documentation

## 2013-04-19 DIAGNOSIS — L98499 Non-pressure chronic ulcer of skin of other sites with unspecified severity: Secondary | ICD-10-CM | POA: Insufficient documentation

## 2013-04-19 DIAGNOSIS — D518 Other vitamin B12 deficiency anemias: Secondary | ICD-10-CM | POA: Insufficient documentation

## 2013-04-19 DIAGNOSIS — Z01812 Encounter for preprocedural laboratory examination: Secondary | ICD-10-CM | POA: Insufficient documentation

## 2013-04-19 DIAGNOSIS — S81809A Unspecified open wound, unspecified lower leg, initial encounter: Secondary | ICD-10-CM

## 2013-04-19 DIAGNOSIS — M353 Polymyalgia rheumatica: Secondary | ICD-10-CM | POA: Insufficient documentation

## 2013-04-19 DIAGNOSIS — Z79899 Other long term (current) drug therapy: Secondary | ICD-10-CM | POA: Insufficient documentation

## 2013-04-19 DIAGNOSIS — I10 Essential (primary) hypertension: Secondary | ICD-10-CM | POA: Insufficient documentation

## 2013-04-19 HISTORY — PX: SKIN SPLIT GRAFT: SHX444

## 2013-04-19 LAB — GLUCOSE, CAPILLARY: Glucose-Capillary: 131 mg/dL — ABNORMAL HIGH (ref 70–99)

## 2013-04-19 SURGERY — APPLICATION, GRAFT, SKIN, SPLIT-THICKNESS
Anesthesia: General | Site: Leg Lower | Laterality: Left | Wound class: Contaminated

## 2013-04-19 MED ORDER — DEXTROSE 5 % IV SOLN
3.0000 g | INTRAVENOUS | Status: AC
  Administered 2013-04-19: 3 g via INTRAVENOUS

## 2013-04-19 MED ORDER — CHLORHEXIDINE GLUCONATE 4 % EX LIQD: 1.0000 "application " | Freq: Once | CUTANEOUS | Status: DC

## 2013-04-19 MED ORDER — PROMETHAZINE HCL 25 MG/ML IJ SOLN: 6.2500 mg | INTRAMUSCULAR | Status: DC | PRN

## 2013-04-19 MED ORDER — ONDANSETRON HCL 4 MG/2ML IJ SOLN
INTRAMUSCULAR | Status: DC | PRN
  Administered 2013-04-19: 4 mg via INTRAVENOUS

## 2013-04-19 MED ORDER — EVICEL 5 ML EX KIT
PACK | CUTANEOUS | Status: AC
  Filled 2013-04-19: qty 1

## 2013-04-19 MED ORDER — LACTATED RINGERS IV SOLN
INTRAVENOUS | Status: DC
  Administered 2013-04-19: 07:00:00 via INTRAVENOUS

## 2013-04-19 MED ORDER — FENTANYL CITRATE 0.05 MG/ML IJ SOLN
INTRAMUSCULAR | Status: DC | PRN
  Administered 2013-04-19: 25 ug via INTRAVENOUS
  Administered 2013-04-19: 50 ug via INTRAVENOUS
  Administered 2013-04-19: 25 ug via INTRAVENOUS

## 2013-04-19 MED ORDER — FENTANYL CITRATE 0.05 MG/ML IJ SOLN: 25.0000 ug | INTRAMUSCULAR | Status: DC | PRN

## 2013-04-19 MED ORDER — EVICEL 5 ML EX KIT
PACK | CUTANEOUS | Status: DC | PRN
  Administered 2013-04-19: 1 via TOPICAL

## 2013-04-19 MED ORDER — HEPARIN SODIUM (PORCINE) 5000 UNIT/ML IJ SOLN
5000.0000 [IU] | Freq: Once | INTRAMUSCULAR | Status: AC
  Administered 2013-04-19: 5000 [IU] via SUBCUTANEOUS

## 2013-04-19 MED ORDER — LIDOCAINE HCL (CARDIAC) 20 MG/ML IV SOLN
INTRAVENOUS | Status: DC | PRN
  Administered 2013-04-19: 80 mg via INTRAVENOUS

## 2013-04-19 MED ORDER — OXYCODONE HCL 5 MG/5ML PO SOLN: 5.0000 mg | Freq: Once | ORAL | Status: DC | PRN

## 2013-04-19 MED ORDER — PROPOFOL 10 MG/ML IV BOLUS
INTRAVENOUS | Status: DC | PRN
  Administered 2013-04-19: 160 mg via INTRAVENOUS

## 2013-04-19 MED ORDER — DEXAMETHASONE SODIUM PHOSPHATE 4 MG/ML IJ SOLN
INTRAMUSCULAR | Status: DC | PRN
  Administered 2013-04-19: 5 mg via INTRAVENOUS

## 2013-04-19 MED ORDER — OXYCODONE HCL 5 MG PO TABS: 5.0000 mg | ORAL_TABLET | Freq: Once | ORAL | Status: DC | PRN

## 2013-04-19 MED ORDER — BACITRACIN-NEOMYCIN-POLYMYXIN 400-5-5000 EX OINT
TOPICAL_OINTMENT | CUTANEOUS | Status: DC | PRN
  Administered 2013-04-19: 1 via TOPICAL

## 2013-04-19 MED ORDER — HYDROCODONE-ACETAMINOPHEN 5-325 MG PO TABS: 1.0000 | ORAL_TABLET | Freq: Four times a day (QID) | ORAL | Status: DC | PRN

## 2013-04-19 SURGICAL SUPPLY — 61 items
ADH SKN CLS APL DERMABOND .7 (GAUZE/BANDAGES/DRESSINGS) ×1
APL SKNCLS STERI-STRIP NONHPOA (GAUZE/BANDAGES/DRESSINGS)
BALL CTTN LRG ABS STRL LF (GAUZE/BANDAGES/DRESSINGS) ×1
BANDAGE ELASTIC 4 VELCRO ST LF (GAUZE/BANDAGES/DRESSINGS) ×1 IMPLANT
BANDAGE ELASTIC 6 VELCRO ST LF (GAUZE/BANDAGES/DRESSINGS) ×1 IMPLANT
BANDAGE GAUZE ELAST BULKY 4 IN (GAUZE/BANDAGES/DRESSINGS) ×1 IMPLANT
BENZOIN TINCTURE PRP APPL 2/3 (GAUZE/BANDAGES/DRESSINGS) ×1 IMPLANT
BLADE DERMATOME SS (BLADE) ×1 IMPLANT
BLADE SURG 11 STRL SS (BLADE) ×1 IMPLANT
BLADE SURG 15 STRL LF DISP TIS (BLADE) ×1 IMPLANT
BLADE SURG 15 STRL SS (BLADE) ×4
BLADE SURG ROTATE 9660 (MISCELLANEOUS) IMPLANT
CANISTER SUCTION 1200CC (MISCELLANEOUS) IMPLANT
CLEANER CAUTERY TIP 5X5 PAD (MISCELLANEOUS) ×1 IMPLANT
CLOTH BEACON ORANGE TIMEOUT ST (SAFETY) ×2 IMPLANT
COTTONBALL LRG STERILE PKG (GAUZE/BANDAGES/DRESSINGS) ×2 IMPLANT
COVER MAYO STAND STRL (DRAPES) ×2 IMPLANT
COVER TABLE BACK 60X90 (DRAPES) ×2 IMPLANT
DECANTER SPIKE VIAL GLASS SM (MISCELLANEOUS) ×1 IMPLANT
DEPRESSOR TONGUE BLADE STERILE (MISCELLANEOUS) ×1 IMPLANT
DERMABOND ADVANCED (GAUZE/BANDAGES/DRESSINGS) ×1
DERMABOND ADVANCED .7 DNX12 (GAUZE/BANDAGES/DRESSINGS) IMPLANT
DERMACARRIERS GRAFT 1 TO 1.5 (DISPOSABLE)
DRAPE PED LAPAROTOMY (DRAPES) ×1 IMPLANT
DRAPE U-SHAPE 76X120 STRL (DRAPES) ×1 IMPLANT
DRESSING OWENS FABRIC (GAUZE/BANDAGES/DRESSINGS) ×2 IMPLANT
DRSG TEGADERM 4X10 (GAUZE/BANDAGES/DRESSINGS) ×1 IMPLANT
ELECT REM PT RETURN 9FT ADLT (ELECTROSURGICAL) ×2
ELECTRODE REM PT RTRN 9FT ADLT (ELECTROSURGICAL) ×1 IMPLANT
GAUZE SPONGE 4X4 12PLY STRL LF (GAUZE/BANDAGES/DRESSINGS) ×1 IMPLANT
GAUZE SPONGE 4X4 16PLY XRAY LF (GAUZE/BANDAGES/DRESSINGS) ×1 IMPLANT
GLOVE BIO SURGEON STRL SZ8 (GLOVE) ×2 IMPLANT
GLOVE BIOGEL PI IND STRL 7.0 (GLOVE) IMPLANT
GLOVE BIOGEL PI INDICATOR 7.0 (GLOVE) ×1
GLOVE ECLIPSE 6.5 STRL STRAW (GLOVE) ×1 IMPLANT
GOWN PREVENTION PLUS XLARGE (GOWN DISPOSABLE) ×2 IMPLANT
GOWN PREVENTION PLUS XXLARGE (GOWN DISPOSABLE) ×2 IMPLANT
GRAFT DERMACARRIERS 1 TO 1.5 (DISPOSABLE) ×1 IMPLANT
NDL HYPO 25X1 1.5 SAFETY (NEEDLE) ×1 IMPLANT
NEEDLE 27GAX1X1/2 (NEEDLE) IMPLANT
NEEDLE HYPO 25X1 1.5 SAFETY (NEEDLE) IMPLANT
NS IRRIG 1000ML POUR BTL (IV SOLUTION) ×2 IMPLANT
PACK BASIN DAY SURGERY FS (CUSTOM PROCEDURE TRAY) ×2 IMPLANT
PAD CLEANER CAUTERY TIP 5X5 (MISCELLANEOUS) ×1
PENCIL BUTTON HOLSTER BLD 10FT (ELECTRODE) ×2 IMPLANT
SLEEVE SCD COMPRESS KNEE MED (MISCELLANEOUS) ×2 IMPLANT
SPONGE GAUZE 4X4 12PLY (GAUZE/BANDAGES/DRESSINGS) ×1 IMPLANT
STAPLER VISISTAT 35W (STAPLE) ×1 IMPLANT
STOCKINETTE TUBULAR 6 INCH (GAUZE/BANDAGES/DRESSINGS) ×1 IMPLANT
STRIP CLOSURE SKIN 1/2X4 (GAUZE/BANDAGES/DRESSINGS) ×1 IMPLANT
SUT ETHILON 2 0 FS 18 (SUTURE) ×4 IMPLANT
SUT ETHILON 5 0 PS 2 18 (SUTURE) IMPLANT
SUT VIC AB 4-0 SH 18 (SUTURE) ×3 IMPLANT
SUT VIC AB 5-0 PS2 18 (SUTURE) IMPLANT
SYR BULB 3OZ (MISCELLANEOUS) ×1 IMPLANT
SYR CONTROL 10ML LL (SYRINGE) ×1 IMPLANT
TOWEL OR 17X24 6PK STRL BLUE (TOWEL DISPOSABLE) ×3 IMPLANT
TRAY DSU PREP LF (CUSTOM PROCEDURE TRAY) ×2 IMPLANT
TUBE CONNECTING 20X1/4 (TUBING) IMPLANT
UNDERPAD 30X30 INCONTINENT (UNDERPADS AND DIAPERS) ×1 IMPLANT
YANKAUER SUCT BULB TIP NO VENT (SUCTIONS) IMPLANT

## 2013-04-19 NOTE — Anesthesia Preprocedure Evaluation (Signed)
Anesthesia Evaluation  Patient identified by MRN, date of birth, ID band  Reviewed: Allergy & Precautions, H&P , NPO status   History of Anesthesia Complications (+) PONV  Airway Mallampati: I  Neck ROM: Full    Dental  (+) Teeth Intact   Pulmonary neg pulmonary ROS,  breath sounds clear to auscultation        Cardiovascular hypertension, + Peripheral Vascular Disease Rhythm:Regular Rate:Normal + Systolic murmurs    Neuro/Psych negative neurological ROS     GI/Hepatic negative GI ROS, Neg liver ROS,   Endo/Other  diabetes, Type 2  Renal/GU negative Renal ROS     Musculoskeletal negative musculoskeletal ROS (+)   Abdominal   Peds  Hematology negative hematology ROS (+)   Anesthesia Other Findings   Reproductive/Obstetrics                           Anesthesia Physical Anesthesia Plan  ASA: II  Anesthesia Plan: General   Post-op Pain Management:    Induction: Intravenous  Airway Management Planned: LMA  Additional Equipment:   Intra-op Plan:   Post-operative Plan: Extubation in OR  Informed Consent: I have reviewed the patients History and Physical, chart, labs and discussed the procedure including the risks, benefits and alternatives for the proposed anesthesia with the patient or authorized representative who has indicated his/her understanding and acceptance.   Dental advisory given  Plan Discussed with: CRNA and Surgeon  Anesthesia Plan Comments:         Anesthesia Quick Evaluation

## 2013-04-19 NOTE — Anesthesia Postprocedure Evaluation (Signed)
  Anesthesia Post-op Note  Patient: Nancy Glover  Procedure(s) Performed: Procedure(s): SKIN GRAFT FULL THICKNESS TO LEFT LEG FROM ABDOMEN (Left)  Patient Location: PACU  Anesthesia Type:General  Level of Consciousness: awake and alert   Airway and Oxygen Therapy: Patient Spontanous Breathing  Post-op Pain: none  Post-op Assessment: Post-op Vital signs reviewed  Post-op Vital Signs: stable  Complications: No apparent anesthesia complications

## 2013-04-19 NOTE — Telephone Encounter (Signed)
The pt called in to schedule her 5 day postop appointment per Dr Daphine Deutscher.  She has a skin graft to her left leg.  Please call [phone].

## 2013-04-19 NOTE — Op Note (Signed)
Surgeon: Wenda Low, MD, FACS  Asst:  none  Anes:  Gen.  Procedure: Harvesting of full thickness skin graft from abdominal wall and grafting to left pretibial region (2.5 x 7 cm)  Diagnosis: Traumatic full thickness losses tissue from left pretibial region  Complications: none  EBL:   4 cc  Description of Procedure:  The patient was taken to room 5 at [facility] day surgery and given general via LMA. The left pretibial region was cleansed by me before prepping with PCMX. Anterior abdominal wall was also prepped with PCMX. I a pre-marked an ellipse that was 7 cm in length and 2-1/2 cm it were greatest with and harvested by making a full-thickness incision and removing some fat with this. Following the harvesting of the skin Bovie was used to control bleeding and this was closed with 4-0 Vicryl subcuticularly and with Dermabond.  The graft was taken back to the back table where was defatted using a sharp blade and also tenotomy scissors and it was fenestrated with an 11 blade with multiple longitudinal full-thickness cuts along the long axis. Fibers still tissue sealant was used to then place this on the graft wound and it was sutured in place with multiple 4-0 Vicryl was. [employer] fabric impregnated with Neosporin ointment was then placed on top of the graft followed by fluffy dressings which were then secured with 4 tie downs to secure the bolus dressing. It was then wrapped with Kerlex and a 6 inch ace. Wrap.    Matt B. Daphine Deutscher, MD, [facility], [place] [phone]

## 2013-04-19 NOTE — H&P (View-Only) (Signed)
Chief Complaint:  14 x 4 cm pretibial tissue loss after fall with slow healing ulcer  History of Present Illness:  Nancy Glover is an 79 y.o. female fell at a yard sale on July 5sustaining an open pretibial wall thickness skin loss and has been treated at the wound center by Dr. Gennette Pac. He spoke with me about her need for a skin graft and asked me to see her for this. She presents today and I measured this to be about 14 cm x 4 cm wide. After 2 months it has good beefy granulation tissue at its base which I think could take a skin graft. I discussed doing that with her unlikely take a panel of skin from her thigh. Another option might be to take a full-thickness panel from her abdomen which I could close primarily.  Past Medical History  Diagnosis Date  . Hypertension   . Diabetes mellitus, type 2   . Ulcer of right leg   . Venous insufficiency, peripheral   . Polymyalgia arteritica     leg pain  . Macular degeneration of left eye   . PONV (postoperative nausea and vomiting)   . Hyperlipidemia     Past Surgical History  Procedure Laterality Date  . Cervical biopsy  w/ loop electrode excision  01-17-2001  DR LOMAX  . Cysto/ hod/ bladder bx  10-23-2006  DR PETERSON  . Cataract extraction w/ intraocular lens implant Right   . Esophagogastroduodenoscopy N/A 09/15/2012    Procedure: ESOPHAGOGASTRODUODENOSCOPY (EGD);  Surgeon: Vertell Novak., MD;  Location: [facility];  Service: Endoscopy;  Laterality: N/A;    Current Outpatient Prescriptions  Medication Sig Dispense Refill  . acetaminophen (TYLENOL) 500 MG tablet Take 1,000 mg by mouth every 6 (six) hours as needed (leg pain).      Marland Kitchen amLODipine (NORVASC) 5 MG tablet Take 5 mg by mouth every morning.      Marland Kitchen amoxicillin-clavulanate (AUGMENTIN) 875-125 MG per tablet Take 1 tablet by mouth every 12 (twelve) hours.  14 tablet  0  . Calcium Carbonate-Vitamin D (CALCIUM + D PO) Take 600 mg by mouth daily.      . cefdinir (OMNICEF) 300  MG capsule Take 300 mg by mouth 2 (two) times daily.      . cephALEXin (KEFLEX) 500 MG capsule       . HYDROcodone-acetaminophen (NORCO/VICODIN) 5-325 MG per tablet Take 1 tablet by mouth every 6 (six) hours as needed for pain.  15 tablet  0  . losartan (COZAAR) 100 MG tablet Take 100 mg by mouth every morning.      . metFORMIN (GLUCOPHAGE) 1000 MG tablet Take 1,000 mg by mouth 2 (two) times daily with a meal.      . omeprazole (PRILOSEC OTC) 20 MG tablet Take 20 mg by mouth every morning.       . predniSONE (DELTASONE) 1 MG tablet Take 1 mg by mouth every morning. Take with 5mg  tablet to make 6mg       . predniSONE (DELTASONE) 5 MG tablet Take 5 mg by mouth every morning. Take with 1mg  tablet to make 6mg       . SANTYL ointment       . simvastatin (ZOCOR) 40 MG tablet Take 40 mg by mouth every evening.      . trimethoprim (TRIMPEX) 100 MG tablet Take 100 mg by mouth daily as needed.      . vitamin A 16109 UNIT capsule Take 10,000 Units by mouth  daily.      Marland Kitchen VITAMIN B1-B12 IM Inject 1,000 mcg/mL into the muscle every 30 (thirty) days.      . furosemide (LASIX) 20 MG tablet Take 20 mg by mouth daily as needed (leg swelling).        No current facility-administered medications for this visit.   Actos and Sulfa antibiotics Family History  Problem Relation Age of Onset  . Colon cancer Father   . Heart disease Father   . Hypertension Father   . Heart attack Father   . Cancer Mother   . Heart disease Mother    Social History:   reports that she quit smoking about 21 years ago. Her smoking use included Cigarettes. She has a 40 pack-year smoking history. She has never used smokeless tobacco. She reports that she does not drink alcohol or use illicit drugs.   REVIEW OF SYSTEMS - PERTINENT POSITIVES ONLY: Negative for DVT. She's had a previous injury to her right pretibial region there required hypobaric oxygen.  Physical Exam:   Blood pressure 130/76, pulse 74, resp. rate 16, height 5\' 3"   (1.6 m), weight 148 lb (67.132 kg). Body mass index is 26.22 kg/(m^2).  Gen:  WDWN white female NAD  Neurological: Alert and oriented to person, place, and time. Motor and sensory function is grossly intact  Head: Normocephalic and atraumatic.  Eyes: Conjunctivae are normal. Pupils are equal, round, and reactive to light. No scleral icterus.  Neck: Normal range of motion. Neck supple. No tracheal deviation or thyromegaly present.  Cardiovascular:  SR without murmurs or gallops.  No carotid bruits Lymphadenopathy: No cervical, preauricular, postauricular or axillary adenopathy is present Skin: Skin is warm and dry. No rash noted. No diaphoresis. No erythema. No pallor. Pscyh: Normal mood and affect. Behavior is normal. Judgment and thought content normal.    LABORATORY RESULTS: No results found for this or any previous visit (from the past 48 hour(s)).  RADIOLOGY RESULTS: No results found.  Problem List: Patient Active Problem List   Diagnosis Date Noted  . Peripheral vascular disease, unspecified 10/30/2012  . Atherosclerosis of native arteries of the extremities with ulceration(440.23) 10/30/2012  . Vitamin B12 deficiency anemia 09/17/2012  . Hypertension 09/14/2012  . Diabetes mellitus 09/14/2012  . Ulcer of right leg 09/14/2012  . Anemia 09/14/2012    Assessment & Plan: Granulation tissue on the left pretibial region.  Will schedule for skin graft.      Matt B. Daphine Deutscher, MD, [facility] [phone] beeper [phone]  03/20/2013 11:55 AM

## 2013-04-19 NOTE — Interval H&P Note (Signed)
History and Physical Interval Note:  04/19/2013 7:21 AM  Nancy Glover  has presented today for surgery, with the diagnosis of open wound LEFT LEG  The various methods of treatment have been discussed with the patient and family. After consideration of risks, benefits and other options for treatment, the patient has consented to  Procedure(s): SKIN GRAFT SPLIT THICKNESS LEFT LEG (Left) as a surgical intervention .  The patient's history has been reviewed, patient examined, no change in status, stable for surgery.  I have reviewed the patient's chart and labs.  Questions were answered to the patient's satisfaction.     [name] B

## 2013-04-19 NOTE — Transfer of Care (Signed)
Immediate Anesthesia Transfer of Care Note  Patient: Nancy Glover  Procedure(s) Performed: Procedure(s): SKIN GRAFT FULL THICKNESS TO LEFT LEG FROM ABDOMEN (Left)  Patient Location: PACU  Anesthesia Type:General  Level of Consciousness: awake, alert  and oriented  Airway & Oxygen Therapy: Patient Spontanous Breathing and Patient connected to face mask oxygen  Post-op Assessment: Report given to PACU RN  Post vital signs: Reviewed and stable  Complications: No apparent anesthesia complications

## 2013-04-19 NOTE — Anesthesia Procedure Notes (Signed)
Procedure Name: LMA Insertion Date/Time: 04/19/2013 7:40 AM Performed by: Maris Berger T Pre-anesthesia Checklist: Patient identified, Emergency Drugs available, Suction available and Patient being monitored Patient Re-evaluated:Patient Re-evaluated prior to inductionOxygen Delivery Method: Circle System Utilized Preoxygenation: Pre-oxygenation with 100% oxygen Intubation Type: IV induction Ventilation: Mask ventilation without difficulty LMA: LMA inserted LMA Size: 4.0 Number of attempts: 1 Airway Equipment and Method: bite block Placement Confirmation: positive ETCO2 Dental Injury: Teeth and Oropharynx as per pre-operative assessment

## 2013-04-22 ENCOUNTER — Encounter (HOSPITAL_BASED_OUTPATIENT_CLINIC_OR_DEPARTMENT_OTHER): Payer: Self-pay | Admitting: Surgery

## 2013-04-24 ENCOUNTER — Encounter (INDEPENDENT_AMBULATORY_CARE_PROVIDER_SITE_OTHER): Payer: Self-pay | Admitting: Surgery

## 2013-04-24 ENCOUNTER — Ambulatory Visit (INDEPENDENT_AMBULATORY_CARE_PROVIDER_SITE_OTHER): Payer: Medicare Other | Admitting: Surgery

## 2013-04-24 VITALS — BP 124/78 | HR 71 | Temp 97.5°F | Resp 16 | Ht 60.3 in | Wt 148.2 lb

## 2013-04-24 DIAGNOSIS — Z945 Skin transplant status: Secondary | ICD-10-CM | POA: Insufficient documentation

## 2013-04-24 NOTE — Patient Instructions (Signed)
You may shower daily Don't rub graft site.  May dry with hair dryer on low. When going out dress with kerlex and ace wrap to prevent abrasion

## 2013-04-24 NOTE — Progress Notes (Signed)
Nancy Glover 79 y.o.  Body mass index is 28.64 kg/(m^2).  Patient Active Problem List   Diagnosis Date Noted  . Peripheral vascular disease, unspecified 10/30/2012  . Atherosclerosis of native arteries of the extremities with ulceration(440.23) 10/30/2012  . Vitamin B12 deficiency anemia 09/17/2012  . Hypertension 09/14/2012  . Diabetes mellitus 09/14/2012  . Ulcer of right leg 09/14/2012  . Anemia 09/14/2012    Allergies  Allergen Reactions  . Actos [Pioglitazone] Hives and Swelling    Body swelling  . Sulfa Antibiotics Swelling    Tongue and mouth swelling    Past Surgical History  Procedure Laterality Date  . Cervical biopsy  w/ loop electrode excision  01-17-2001  DR LOMAX  . Cysto/ hod/ bladder bx  10-23-2006  DR PETERSON  . Cataract extraction w/ intraocular lens implant Right   . Esophagogastroduodenoscopy N/A 09/15/2012    Procedure: ESOPHAGOGASTRODUODENOSCOPY (EGD);  Surgeon: Vertell Novak., MD;  Location: [facility];  Service: Endoscopy;  Laterality: N/A;  . Tonsillectomy    . Eye surgery      rt cataract  . Fracture surgery  1975    lt elbow-fx  . Skin split graft Left 04/19/2013    Procedure: SKIN GRAFT FULL THICKNESS TO LEFT LEG FROM ABDOMEN;  Surgeon: Valarie Merino, MD;  Location: [facility];  Service: General;  Laterality: Left;   [name], MD No diagnosis found.  Bolus dressing taken off.  Graft is white and adherent and looks good.  Redressed with adaptic, telfa, kerlex and ace wrap.  Neosporin applied to adaptic.  Donor site on lower abdomen looks good too.   Return in 3 weeks Matt B. Daphine Deutscher, MD, [facility] [phone] beeper [phone]  04/24/2013 5:02 PM

## 2013-05-08 ENCOUNTER — Encounter (INDEPENDENT_AMBULATORY_CARE_PROVIDER_SITE_OTHER): Payer: Self-pay | Admitting: Surgery

## 2013-05-08 ENCOUNTER — Ambulatory Visit (INDEPENDENT_AMBULATORY_CARE_PROVIDER_SITE_OTHER): Payer: Medicare Other | Admitting: Surgery

## 2013-05-08 VITALS — BP 178/70 | HR 80 | Resp 16 | Ht 63.0 in | Wt 154.2 lb

## 2013-05-08 DIAGNOSIS — Z945 Skin transplant status: Secondary | ICD-10-CM

## 2013-05-08 NOTE — Progress Notes (Signed)
Nancy Glover 79 y.o.  Body mass index is 27.32 kg/(m^2).  Patient Active Problem List   Diagnosis Date Noted  . Status post skin graft-left pretibial region 04/24/2013  . Peripheral vascular disease, unspecified 10/30/2012  . Atherosclerosis of native arteries of the extremities with ulceration(440.23) 10/30/2012  . Vitamin B12 deficiency anemia 09/17/2012  . Hypertension 09/14/2012  . Diabetes mellitus 09/14/2012  . Ulcer of right leg 09/14/2012  . Anemia 09/14/2012    Allergies  Allergen Reactions  . Actos [Pioglitazone] Hives and Swelling    Body swelling  . Sulfa Antibiotics Swelling    Tongue and mouth swelling    Past Surgical History  Procedure Laterality Date  . Cervical biopsy  w/ loop electrode excision  01-17-2001  DR LOMAX  . Cysto/ hod/ bladder bx  10-23-2006  DR PETERSON  . Cataract extraction w/ intraocular lens implant Right   . Esophagogastroduodenoscopy N/A 09/15/2012    Procedure: ESOPHAGOGASTRODUODENOSCOPY (EGD);  Surgeon: Vertell Novak., MD;  Location: [facility];  Service: Endoscopy;  Laterality: N/A;  . Tonsillectomy    . Eye surgery      rt cataract  . Fracture surgery  1975    lt elbow-fx  . Skin split graft Left 04/19/2013    Procedure: SKIN GRAFT FULL THICKNESS TO LEFT LEG FROM ABDOMEN;  Surgeon: Valarie Merino, MD;  Location: [facility];  Service: General;  Laterality: Left;   [name], MD No diagnosis found.  Today I removed the sutures holding her skin graft in place. I trimmed some of the areas around the periphery where the separation that is already occurred. I told this is like a scab needs a stable and ultimately separated as there is healing underneath. It does not appear to be infected and looked pretty good. I'll see her again in 3 weeks Matt B. Daphine Deutscher, MD, [facility] [phone] beeper [phone]  05/08/2013 5:01 PM

## 2013-05-08 NOTE — Patient Instructions (Signed)
Thanks for your patience.  If you need further assistance after leaving the office, please call our office and speak with a [facility] nurse.  [phone].  If you want to leave a message for Dr. Eustacio Ellen, please call his office phone at [phone]. 

## 2013-05-23 ENCOUNTER — Other Ambulatory Visit: Payer: Self-pay

## 2013-06-04 ENCOUNTER — Encounter (INDEPENDENT_AMBULATORY_CARE_PROVIDER_SITE_OTHER): Payer: Medicare Other | Admitting: Surgery

## 2013-06-07 ENCOUNTER — Encounter (INDEPENDENT_AMBULATORY_CARE_PROVIDER_SITE_OTHER): Payer: Self-pay | Admitting: Surgery

## 2013-06-07 ENCOUNTER — Ambulatory Visit (INDEPENDENT_AMBULATORY_CARE_PROVIDER_SITE_OTHER): Payer: Medicare Other | Admitting: Surgery

## 2013-06-07 VITALS — BP 148/80 | HR 88 | Temp 98.0°F | Resp 15 | Ht 63.0 in | Wt 155.0 lb

## 2013-06-07 DIAGNOSIS — Z945 Skin transplant status: Secondary | ICD-10-CM

## 2013-06-07 NOTE — Patient Instructions (Signed)
Continue to clean and apply Neosporin daily

## 2013-06-07 NOTE — Progress Notes (Signed)
Nancy Glover 79 y.o.  Body mass index is 27.46 kg/(m^2).  Patient Active Problem List   Diagnosis Date Noted  . Status post skin graft-left pretibial region 04/24/2013  . Peripheral vascular disease, unspecified 10/30/2012  . Atherosclerosis of native arteries of the extremities with ulceration(440.23) 10/30/2012  . Vitamin B12 deficiency anemia 09/17/2012  . Hypertension 09/14/2012  . Diabetes mellitus 09/14/2012  . Ulcer of right leg 09/14/2012  . Anemia 09/14/2012    Allergies  Allergen Reactions  . Actos [Pioglitazone] Hives and Swelling    Body swelling  . Sulfa Antibiotics Swelling    Tongue and mouth swelling    Past Surgical History  Procedure Laterality Date  . Cervical biopsy  w/ loop electrode excision  01-17-2001  DR LOMAX  . Cysto/ hod/ bladder bx  10-23-2006  DR PETERSON  . Cataract extraction w/ intraocular lens implant Right   . Esophagogastroduodenoscopy N/A 09/15/2012    Procedure: ESOPHAGOGASTRODUODENOSCOPY (EGD);  Surgeon: Vertell Novak., MD;  Location: [facility];  Service: Endoscopy;  Laterality: N/A;  . Tonsillectomy    . Eye surgery      rt cataract  . Fracture surgery  1975    lt elbow-fx  . Skin split graft Left 04/19/2013    Procedure: SKIN GRAFT FULL THICKNESS TO LEFT LEG FROM ABDOMEN;  Surgeon: Valarie Merino, MD;  Location: [facility];  Service: General;  Laterality: Left;   [name], MD No diagnosis found.  Nancy Glover leg is healing. There is a well demarcated scab which removed as there was an area of a little separation or part of it. This was cleaned and Neosporin was applied. It looks like he is healing very well and she is having no pain. I'll see her back in about 6 weeks. Matt B. Daphine Deutscher, MD, [facility] [place] [facility] [phone] beeper [phone]  06/07/2013 2:39 PM

## 2013-07-26 ENCOUNTER — Ambulatory Visit (INDEPENDENT_AMBULATORY_CARE_PROVIDER_SITE_OTHER): Payer: Medicare Other | Admitting: Surgery

## 2013-07-26 VITALS — BP 150/74 | HR 80 | Temp 97.6°F | Resp 18 | Ht 63.0 in | Wt 158.5 lb

## 2013-07-26 DIAGNOSIS — Z945 Skin transplant status: Secondary | ICD-10-CM

## 2013-07-26 NOTE — Patient Instructions (Signed)
Thanks for your patience.  If you need further assistance after leaving the office, please call our office and speak with a [facility] nurse.  [phone].  If you want to leave a message for Dr. Jilliana Burkes, please call his office phone at [phone]. 

## 2013-07-26 NOTE — Progress Notes (Signed)
Nancy Glover 80 y.o.  Body mass index is 28.08 kg/(m^2).  Patient Active Problem List   Diagnosis Date Noted  . Status post skin graft-left pretibial region 04/24/2013  . Peripheral vascular disease, unspecified 10/30/2012  . Atherosclerosis of native arteries of the extremities with ulceration(440.23) 10/30/2012  . Vitamin B12 deficiency anemia 09/17/2012  . Hypertension 09/14/2012  . Diabetes mellitus 09/14/2012  . Ulcer of right leg 09/14/2012  . Anemia 09/14/2012    Allergies  Allergen Reactions  . Actos [Pioglitazone] Hives and Swelling    Body swelling  . Sulfa Antibiotics Swelling    Tongue and mouth swelling    Past Surgical History  Procedure Laterality Date  . Cervical biopsy  w/ loop electrode excision  01-17-2001  DR LOMAX  . Cysto/ hod/ bladder bx  10-23-2006  DR PETERSON  . Cataract extraction w/ intraocular lens implant Right   . Esophagogastroduodenoscopy N/A 09/15/2012    Procedure: ESOPHAGOGASTRODUODENOSCOPY (EGD);  Surgeon: Nancy Glover., MD;  Location: [facility];  Service: Endoscopy;  Laterality: N/A;  . Tonsillectomy    . Eye surgery      rt cataract  . Fracture surgery  1975    lt elbow-fx  . Skin split graft Left 04/19/2013    Procedure: SKIN GRAFT FULL THICKNESS TO LEFT LEG FROM ABDOMEN;  Surgeon: Nancy Earls, MD;  Location: [facility];  Service: General;  Laterality: Left;   Nancy Naas, MD No diagnosis found.  Full-thickness skin graft a laparoscopic cholecystectomy left leg has healed nicely. I have advised her keep the patch over this to protect it from trauma. She will get back to her recreational square dancing. We'll be glad to see again as needed. Return when necessary Nancy Glover Done, MD, [facility] [phone] beeper [phone]  07/26/2013 1:52 PM

## 2014-04-22 ENCOUNTER — Other Ambulatory Visit: Payer: Self-pay | Admitting: Family Medicine

## 2014-04-22 ENCOUNTER — Ambulatory Visit
Admission: RE | Admit: 2014-04-22 | Discharge: 2014-04-22 | Disposition: A | Discharge status: Home / Self Care | Payer: Medicare Other | Source: Ambulatory Visit | Attending: Family Medicine | Admitting: Family Medicine

## 2014-04-22 DIAGNOSIS — J209 Acute bronchitis, unspecified: Secondary | ICD-10-CM

## 2014-06-02 ENCOUNTER — Ambulatory Visit
Admission: RE | Admit: 2014-06-02 | Discharge: 2014-06-02 | Disposition: A | Discharge status: Home / Self Care | Payer: Medicare Other | Source: Ambulatory Visit | Attending: Family Medicine | Admitting: Family Medicine

## 2014-06-02 ENCOUNTER — Other Ambulatory Visit: Payer: Self-pay | Admitting: Family Medicine

## 2014-06-02 DIAGNOSIS — J189 Pneumonia, unspecified organism: Secondary | ICD-10-CM

## 2014-10-01 ENCOUNTER — Ambulatory Visit: Payer: Self-pay | Admitting: Endocrinology

## 2014-10-22 ENCOUNTER — Ambulatory Visit (INDEPENDENT_AMBULATORY_CARE_PROVIDER_SITE_OTHER): Payer: Medicare Other | Admitting: Endocrinology

## 2014-10-22 ENCOUNTER — Encounter: Payer: Self-pay | Admitting: Endocrinology

## 2014-10-22 VITALS — BP 130/80 | HR 70 | Temp 97.9°F | Ht 63.0 in | Wt 155.0 lb

## 2014-10-22 DIAGNOSIS — I10 Essential (primary) hypertension: Secondary | ICD-10-CM | POA: Diagnosis not present

## 2014-10-22 DIAGNOSIS — E1165 Type 2 diabetes mellitus with hyperglycemia: Secondary | ICD-10-CM | POA: Diagnosis not present

## 2014-10-22 DIAGNOSIS — IMO0002 Reserved for concepts with insufficient information to code with codable children: Secondary | ICD-10-CM

## 2014-10-22 MED ORDER — GLIMEPIRIDE 1 MG PO TABS: 1.0000 mg | ORAL_TABLET | Freq: Every day | ORAL | Status: DC

## 2014-10-22 NOTE — Patient Instructions (Addendum)
Have egg and toast at [facility] Have only diet drinks  Glimeperide before breakfast daily  Have a snack at 2-3 pm with protein  Please check blood sugars at least half the time about 2 hours after any meal and 3 times per week on waking up.   Please bring blood sugar monitor to each visit. Recommended blood sugar levels about 2 hours after meal is 140-180 and on waking up 90-130

## 2014-10-22 NOTE — Progress Notes (Signed)
Pre visit review using our clinic review tool, if applicable. No additional management support is needed unless otherwise documented below in the visit note. 

## 2014-10-22 NOTE — Progress Notes (Signed)
Patient ID: Nancy Glover, female   DOB: 15-Jun-1934, 81 y.o.   MRN: [id number]           Reason for Appointment: Consultation for Type 2 Diabetes  Referring physician:Candace Tamala Julian  History of Present Illness:          Diagnosis: Type 2 diabetes mellitus, date of diagnosis: 2004       Past history: patient is unclear about the date and circumstances of her diagnosis. She probably has been on metformin since that time for diagnosis but not clear when her dose was increased to 1 g twice a day No detailed records of her previous level of control are available  Recent history:  The patient is a relatively poor historian Her A1c has been gradually increasing over the last year apparently, was 7.7 in 05/2014 She thinks she was tried on Januvia couple months ago and although it may have helped her sugar control she could not afford it. She continues to take metformin twice a day only She has not brought her monitor for review since has not worked for a week She thinks her blood sugars in the mornings are about 125 usually.  Does not check any readings after meals  She takes she has been to the dietitian but does not appear to be following any particular plan and is eating what she likes She will frequently drinking regular soft drinks in the early afternoon Does not have any protein at breakfast most of the time       Oral hypoglycemic drugs the patient is taking are: metformin 1 g twice a day     Side effects from medications have been:none   Compliance with the medical regimen: fair Hypoglycemia: none   Glucose monitoring:  done one time a day         Glucometer: Accucheck      Blood Glucose readings by recall  PREMEAL Breakfast Lunch Dinner Bedtime  Overall   Glucose range: 125-150      Median:         Self-care: The diet that the patient has been following is: none Drinks Cokes, usually in the early afternoon and has peanuts as snacks  Meals: 2 meals per day. Breakfast is  cereal; dinner 6 pm                Dietician visit, most recent: ? 2010.               Exercise: unable to, has leg pains     Weight history:  Wt Readings from Last 3 Encounters:  10/22/14 155 lb (70.308 kg)  07/26/13 158 lb 8 oz (71.895 kg)  06/07/13 155 lb (70.308 kg)    Glycemic control: 7.8   No results found for: HGBA1C Lab Results  Component Value Date   CREATININE 0.95 04/17/2013         Medication List       This list is accurate as of: 10/22/14  3:39 PM.  Always use your most recent med list.               ACCU-CHEK AVIVA PLUS test strip  Generic drug:  glucose blood     acetaminophen 500 MG tablet  Commonly known as:  TYLENOL  Take 1,000 mg by mouth every 6 (six) hours as needed (leg pain).     amitriptyline 25 MG tablet  Commonly known as:  ELAVIL     amLODipine 5 MG tablet  Commonly known  as:  NORVASC  Take 5 mg by mouth every morning.     CALCIUM + D PO  Take 600 mg by mouth daily.     furosemide 20 MG tablet  Commonly known as:  LASIX  Take 20 mg by mouth daily as needed (leg swelling).     glimepiride 1 MG tablet  Commonly known as:  AMARYL  Take 1 tablet (1 mg total) by mouth daily with breakfast.     HYDROcodone-acetaminophen 5-325 MG per tablet  Commonly known as:  NORCO/VICODIN  Take 1 tablet by mouth every 6 (six) hours as needed for pain.     losartan 100 MG tablet  Commonly known as:  COZAAR  Take 100 mg by mouth every morning.     metFORMIN 1000 MG tablet  Commonly known as:  GLUCOPHAGE  Take 1,000 mg by mouth 2 (two) times daily with a meal.     omeprazole 20 MG tablet  Commonly known as:  PRILOSEC OTC  Take 20 mg by mouth every morning.     predniSONE 1 MG tablet  Commonly known as:  DELTASONE  Take 1 mg by mouth every morning. Take with 5mg  tablet to make 6mg      predniSONE 5 MG tablet  Commonly known as:  DELTASONE  Take 5 mg by mouth every morning. Take with 1mg  tablet to make 6mg      SANTYL ointment    Generic drug:  collagenase     simvastatin 40 MG tablet  Commonly known as:  ZOCOR  Take 40 mg by mouth every evening.     trimethoprim 100 MG tablet  Commonly known as:  TRIMPEX  Take 100 mg by mouth daily as needed.     vitamin A 10000 UNIT capsule  Take 10,000 Units by mouth daily.     VITAMIN B1-B12 IM  Inject 1,000 mcg/mL into the muscle every 30 (thirty) days.        Allergies:  Allergies  Allergen Reactions  . Actos [Pioglitazone] Hives and Swelling    Body swelling  . Sulfa Antibiotics Swelling    Tongue and mouth swelling    Past Medical History  Diagnosis Date  . Hypertension   . Diabetes mellitus, type 2   . Ulcer of right leg   . Venous insufficiency, peripheral   . Macular degeneration of left eye   . PONV (postoperative nausea and vomiting)   . Hyperlipidemia   . PMR (polymyalgia rheumatica) 2003    Past Surgical History  Procedure Laterality Date  . Cervical biopsy  w/ loop electrode excision  01-17-2001  DR LOMAX  . Cysto/ hod/ bladder bx  10-23-2006  DR PETERSON  . Cataract extraction w/ intraocular lens implant Right   . Esophagogastroduodenoscopy N/A 09/15/2012    Procedure: ESOPHAGOGASTRODUODENOSCOPY (EGD);  Surgeon: Winfield Cunas., MD;  Location: [facility];  Service: Endoscopy;  Laterality: N/A;  . Tonsillectomy    . Eye surgery      rt cataract  . Fracture surgery  1975    lt elbow-fx  . Skin split graft Left 04/19/2013    Procedure: SKIN GRAFT FULL THICKNESS TO LEFT LEG FROM ABDOMEN;  Surgeon: Pedro Earls, MD;  Location: [facility];  Service: General;  Laterality: Left;    Family History  Problem Relation Age of Onset  . Colon cancer Father   . Heart disease Father   . Hypertension Father   . Heart attack Father   . Cancer Mother   .  Heart disease Mother   . Diabetes Neg Hx     Social History:  reports that she quit smoking about 23 years ago. Her smoking use included Cigarettes. She has a 40  pack-year smoking history. She has never used smokeless tobacco. She reports that she does not drink alcohol or use illicit drugs.    Review of Systems    Lipid history: her last LDL done by her PCP was 70 with HDL of 62 She has been on simvastatin for uncertain duration of time      No results found for: CHOL, HDL, LDLCALC, LDLDIRECT, TRIG, CHOLHDL         Constitutional: no recent weight gain/loss, no complaints of unusual fatigue   Eyes: no history of blurred vision. Most recent eye exam was 1/16, reportedly normal   ENT: no difficulty swallowing  Cardiovascular: no chest pain or tightness on exertion.  No leg swelling.  Hypertension:present for several years and treated with losartan and amlodipine  Respiratory: no shortness of breath  Gastrointestinal: no constipation, diarrhea or abdominal pain.  Last colonoscopy 2012  Musculoskeletal: no muscle/joint aches recently.  She has had significant problem with PMR for the last 3-4 years taking prednisone.  She tends to have significant discomfort if she tries to reduce her prednisone below 6 mg No recent sedimentation rate available, followed by rheumatologist   Urological:   No frequency of urination but does have nocturia 3-4 times  Skin: no rash or infections  Neurological: no headaches.  Has no numbness, burning, pains or tingling in feet    Endocrine: No history of thyroid disease or cold intolerance   She takes B-12 shots for reportedly B-12 deficiency anemia  LABS:  Last serum creatinine was 0.69   Physical Examination:  BP 130/80 mmHg  Pulse 70  Temp(Src) 97.9 F (36.6 C) (Oral)  Ht 5\' 3"  (1.6 m)  Wt 155 lb (70.308 kg)  BMI 27.46 kg/m2  GENERAL:         Patient has generalized obesity.   HEENT:         Eye exam shows normal external appearance. Fundus exam deferred to ophthalmologist, has had recent exam  Oral exam shows normal mucosa .  NECK:         General:  Neck exam shows no lymphadenopathy. Carotids  are normal to palpation and no bruit heard.  Thyroid is not enlarged and no nodules felt.   LUNGS:         Chest is symmetrical. Lungs are clear to auscultation.Marland Kitchen   HEART:         Heart sounds:  S1 and S2 are normal. No murmurs or clicks heard., no S3 or S4.   ABDOMEN:   There is no distention present. Liver and spleen are not palpable. No other mass or tenderness present.  EXTREMITIES:     There is no edema. No skin lesions present.Marland Kitchen  NEUROLOGICAL:   Vibration sense is mildly reduced in toes. Ankle jerks are absent bilaterally.           normal monofilament sensation and pulses in her feet.  Normal to inspection MUSCULOSKELETAL:       There is no enlargement or deformity of the joints. Spine is normal to inspection.Marland Kitchen   SKIN:       No rash or lesions of concern.        ASSESSMENT:  Diabetes type 2, uncontrolled with last A1c of 8.1 Currently taking 2 g metformin only as her treatment  regimen She is not significantly obese Some of her hyperglycemia may be related to her continuing to take prednisone and this would cause hyperglycemia during the day mostly Since her fasting readings reportedly at home are fairly good she probably has high postprandial readings Today her blood sugar is around 160, about 3 hours after breakfast but she had a balanced meal  She is not following any particular diet and is eating high glycemic index meals at breakfast and drinking regular soft drinks. Not able to do any exercise because of her leg pains     Complications:none evident  HYPERTENSION: Appears well controlled  Hyperlipidemia: Has had good control with simvastatin  PLAN:   Although she is a good candidate for adding Januvia she cannot afford this as it is not covered by insurance.  She is reluctant to try injectable medications and prefers to use generics  Will try her on Amaryl 1 mg daily before breakfast.  This may need to be increased based on her blood sugar patterns.  Discussed potential  for hypoglycemia especially if she misses a meal during the day  Discussed principles of balanced meals but reduce carbohydrate and excessive simple sugars  She will be seen by the dietitian  Consider doing chair exercises  She will given another Accu-Chek Nano meter and she will check blood sugars at least half the time about 2 hours after meals for review on the next visit in one month    Patient Instructions  Have egg and toast at Bfst Have only diet drinks  Glimeperide before Bfst daily  Have a snack at 2-3 pm with protein  Please check blood sugars at least half the time about 2 hours after any meal and 3 times per week on waking up.   Please bring blood sugar monitor to each visit. Recommended blood sugar levels about 2 hours after meal is 140-180 and on waking up 90-130   counseling time over 50% of her 68minute visit  Willadean Guyton 10/22/2014, 3:39 PM   Note: This office note was prepared with Estate agent. Any transcriptional errors that result from this process are unintentional.

## 2014-11-13 ENCOUNTER — Encounter: Payer: Self-pay | Admitting: Dietician

## 2014-11-13 ENCOUNTER — Encounter: Payer: Medicare Other | Attending: Endocrinology | Admitting: Dietician

## 2014-11-13 VITALS — Ht 63.0 in | Wt 155.0 lb

## 2014-11-13 DIAGNOSIS — Z713 Dietary counseling and surveillance: Secondary | ICD-10-CM | POA: Diagnosis not present

## 2014-11-13 DIAGNOSIS — E118 Type 2 diabetes mellitus with unspecified complications: Secondary | ICD-10-CM | POA: Diagnosis not present

## 2014-11-13 NOTE — Patient Instructions (Signed)
Rethink what you drink.  Avoid drinking anything with sugar. Consider changing breakfast to 1 slice whole wheat toast with peanut butter or egg and fruit or greek yogurt and fruit. Or 1/2 cup cottage cheese and and fruit.  Aim for regularly scheduled meals, don't skip.  Consider armchair exercises Aim for half your plate non-starchy vegetables. Have dried beans instead of meat at some meals.

## 2014-11-13 NOTE — Progress Notes (Signed)
  Medical Nutrition Therapy:  Appt start time: 1430 end time:  0272.   Assessment:  Primary concerns today: Patient wants to know what to eat and drink to improve her blood sugar and also to better understand the HgbA1C.  Her last HgbA1C was 8.0% per patient about a month ago.  Patient has had diabetes since 2004.  Checks blood sugar 2-3 times per day (when she remembers).  CBG 125 before breakfast, 136 after lunch, 140 after dinner.  She is on prednisone for polymyalgia.  Patient is here with her husband. Weight today is 155 lbs increased from 144 lbs about 1 1/2 years ago.  Gained weight after fall and leg injury.  UBW prior was 180 lbs.  Weight not stable recently.  She is retired.  Lives with husband.  Patient does the shopping and cooking.  Patient drinks a lot of sweet tea and soda and is reluctant to make changes.  Preferred Learning Style:   No preference indicated   Learning Readiness:   Contemplating   Ready  MEDICATIONS: see list to include Amaryl and Glucophage   DIETARY INTAKE: 24-hr recall:  Patient is not willing to decrease  B (8 AM): cereal (honey nut cheerios or raisin bran) with whole milk or gravy biscuit or egg/sausage biscuit and black coffee  Snk ( AM): nabs  L ( PM): sometimes skips Snk ( PM):  D (2-3 PM): salmon patties, pintos, and slaw or cabbage with potatoes and onions, pickled beets, (no meat) or pintos or blackeyed peas (ham flavor packet and butter) with cornbread Snk (4:30 PM): sweet roll, pie, or cake and black coffee Beverages: black coffee, 16 oz regular coke, water (not much), "a lot" of sweet tea (1 1/23 cup sugar per 2 cups water)   Usual physical activity: ADL's  Estimated energy needs: 1800 calories 200 g carbohydrates 113 g protein 60 g fat  Progress Towards Goal(s):  In progress.   Nutritional Diagnosis:  NB-1.1 Food and nutrition-related knowledge deficit As related to balance of carbohydrates, protein, and fat.  As evidenced by  diet hx and increased use of sugar sweetened beverages.    Intervention:  Nutrition counseling and diabetes education initiated. Discussed Carb Counting by food group as method of portion control, reading food labels, and benefits of increased activity. Also discussed basic physiology of Diabetes, target BG ranges pre and post meals, and A1c.  . Rethink what you drink.  Avoid drinking anything with sugar. Consider changing breakfast to 1 slice whole wheat toast with peanut butter or egg and fruit or greek yogurt and fruit. Or 1/2 cup cottage cheese and and fruit.  Aim for regularly scheduled meals, don't skip.  Consider armchair exercises Aim for half your plate non-starchy vegetables. Have dried beans instead of meat at some meals.  Teaching Method Utilized:  Visual Auditory Hands on  Handouts given during visit include:  My Plate placemat  Meal plan card  Label reading  HgbA1C sheet  Snack list  Barriers to learning/adherence to lifestyle change: none  Demonstrated degree of understanding via:  Teach Back   Monitoring/Evaluation:  Dietary intake, exercise, label reading, and body weight in 1 month(s).

## 2014-11-19 ENCOUNTER — Ambulatory Visit (INDEPENDENT_AMBULATORY_CARE_PROVIDER_SITE_OTHER): Payer: Medicare Other | Admitting: Endocrinology

## 2014-11-19 ENCOUNTER — Encounter: Payer: Self-pay | Admitting: Endocrinology

## 2014-11-19 VITALS — BP 134/72 | HR 81 | Temp 98.3°F | Resp 14 | Ht 63.0 in | Wt 155.8 lb

## 2014-11-19 DIAGNOSIS — I10 Essential (primary) hypertension: Secondary | ICD-10-CM

## 2014-11-19 DIAGNOSIS — E1165 Type 2 diabetes mellitus with hyperglycemia: Secondary | ICD-10-CM

## 2014-11-19 DIAGNOSIS — IMO0002 Reserved for concepts with insufficient information to code with codable children: Secondary | ICD-10-CM

## 2014-11-19 LAB — BASIC METABOLIC PANEL
BUN: 16 mg/dL (ref 6–23)
CO2: 27 mEq/L (ref 19–32)
Calcium: 9.4 mg/dL (ref 8.4–10.5)
Chloride: 104 mEq/L (ref 96–112)
Creatinine, Ser: 0.73 mg/dL (ref 0.40–1.20)
GFR: 81.74 mL/min (ref >60.00)
GLUCOSE: 91 mg/dL (ref 70–99)
POTASSIUM: 3.4 meq/L — AB (ref 3.5–5.1)
Sodium: 137 mEq/L (ref 135–145)

## 2014-11-19 MED ORDER — GLIPIZIDE 5 MG PO TABS: ORAL_TABLET | ORAL | Status: DC

## 2014-11-19 NOTE — Progress Notes (Signed)
Patient ID: Nancy Glover, female   DOB: Nov 20, 1933, 81 y.o.   MRN: [id number]           Reason for Appointment: Follow-up for Type 2 Diabetes  Referring physician:Candace Tamala Julian  History of Present Illness:          Diagnosis: Type 2 diabetes mellitus, date of diagnosis: 2004       Past history: patient is unclear about the date and circumstances of her diagnosis. She probably has been on metformin since that time for diagnosis but not clear when her dose was increased to 1 g twice a day No detailed records of her previous level of control are available  Recent history:  The patient is a relatively poor historian and has difficulty following instructions She was instructed on her glucose monitoring and advised to bring her monitor for review but she did not do so Because of her poor control with metformin alone she was started on Amaryl 1 mg in the morning daily She had refused to consider any brand-name medications because of cost; previously had been tried on Januvia  Diet: She is still not consistent and still drinks some regular soft drinks in the afternoon Has seen the dietitian and was advised to make some changes, not clear if she is following instructions She was advised to eat balanced meals especially adding protein at breakfast POSTPRANDIAL readings after supper are very high by history and she does not know why they are higher at that time only Today blood sugars only 119 with her skipping lunch She thinks fasting readings are fairly good       Oral hypoglycemic drugs the patient is taking are: metformin 1 g twice a day, Amaryl 1 mg in the morning      Side effects from medications have been:none   Compliance with the medical regimen: fair Hypoglycemia: none   Glucose monitoring:  ?  Twice a day         Glucometer: Accucheck    Blood Glucose readings by recall  PREMEAL Breakfast Lunch pcl Bedtime  Overall   Glucose range: 75-125  150 250-350   Median:         Self-care: The diet that the patient has been following is: none Drinks some Cokes, usually in the early afternoon and has peanuts as snacks  Meals: 2 meals per day. Breakfast is cereal; dinner 6 pm                Dietician visit, most recent: ? 2010.               Exercise: unable to do much but this trying to do a little walking, has leg pains     Weight history:  Wt Readings from Last 3 Encounters:  11/19/14 155 lb 12.8 oz (70.67 kg)  11/13/14 155 lb (70.308 kg)  10/22/14 155 lb (70.308 kg)    Glycemic control: 7.8   No results found for: HGBA1C Lab Results  Component Value Date   CREATININE 0.73 11/19/2014         Medication List       This list is accurate as of: 11/19/14  9:19 PM.  Always use your most recent med list.               ACCU-CHEK AVIVA PLUS test strip  Generic drug:  glucose blood     acetaminophen 500 MG tablet  Commonly known as:  TYLENOL  Take 1,000 mg by mouth every  6 (six) hours as needed (leg pain).     amitriptyline 25 MG tablet  Commonly known as:  ELAVIL     amLODipine 5 MG tablet  Commonly known as:  NORVASC  Take 5 mg by mouth every morning.     CALCIUM + D PO  Take 600 mg by mouth daily.     furosemide 20 MG tablet  Commonly known as:  LASIX  Take 20 mg by mouth daily as needed (leg swelling).     glipiZIDE 5 MG tablet  Commonly known as:  GLUCOTROL  1/2 BEFORE BREAKFAST AND 1 BEFORE SUPPER     HYDROcodone-acetaminophen 5-325 MG per tablet  Commonly known as:  NORCO/VICODIN  Take 1 tablet by mouth every 6 (six) hours as needed for pain.     losartan 100 MG tablet  Commonly known as:  COZAAR  Take 100 mg by mouth every morning.     metFORMIN 1000 MG tablet  Commonly known as:  GLUCOPHAGE  Take 1,000 mg by mouth 2 (two) times daily with a meal.     omeprazole 20 MG tablet  Commonly known as:  PRILOSEC OTC  Take 20 mg by mouth every morning.     predniSONE 1 MG tablet  Commonly known as:  DELTASONE  Take 1 mg by  mouth every morning. Take with 5mg  tablet to make 6mg      predniSONE 5 MG tablet  Commonly known as:  DELTASONE  Take 5 mg by mouth every morning. Take with 1mg  tablet to make 6mg      SANTYL ointment  Generic drug:  collagenase     simvastatin 40 MG tablet  Commonly known as:  ZOCOR  Take 40 mg by mouth every evening.     trimethoprim 100 MG tablet  Commonly known as:  TRIMPEX  Take 100 mg by mouth daily as needed.     vitamin A 10000 UNIT capsule  Take 10,000 Units by mouth daily.     VITAMIN B1-B12 IM  Inject 1,000 mcg/mL into the muscle every 30 (thirty) days.        Allergies:  Allergies  Allergen Reactions  . Actos [Pioglitazone] Hives and Swelling    Body swelling  . Sulfa Antibiotics Swelling    Tongue and mouth swelling    Past Medical History  Diagnosis Date  . Hypertension   . Diabetes mellitus, type 2   . Ulcer of right leg   . Venous insufficiency, peripheral   . Macular degeneration of left eye   . PONV (postoperative nausea and vomiting)   . Hyperlipidemia   . PMR (polymyalgia rheumatica) 2003    Past Surgical History  Procedure Laterality Date  . Cervical biopsy  w/ loop electrode excision  01-17-2001  DR LOMAX  . Cysto/ hod/ bladder bx  10-23-2006  DR PETERSON  . Cataract extraction w/ intraocular lens implant Right   . Esophagogastroduodenoscopy N/A 09/15/2012    Procedure: ESOPHAGOGASTRODUODENOSCOPY (EGD);  Surgeon: Winfield Cunas., MD;  Location: [facility];  Service: Endoscopy;  Laterality: N/A;  . Tonsillectomy    . Eye surgery      rt cataract  . Fracture surgery  1975    lt elbow-fx  . Skin split graft Left 04/19/2013    Procedure: SKIN GRAFT FULL THICKNESS TO LEFT LEG FROM ABDOMEN;  Surgeon: Pedro Earls, MD;  Location: [facility];  Service: General;  Laterality: Left;    Family History  Problem Relation Age of Onset  .  Colon cancer Father   . Heart disease Father   . Hypertension Father   . Heart attack  Father   . Cancer Mother   . Heart disease Mother   . Diabetes Neg Hx     Social History:  reports that she quit smoking about 23 years ago. Her smoking use included Cigarettes. She has a 40 pack-year smoking history. She has never used smokeless tobacco. She reports that she does not drink alcohol or use illicit drugs.    Review of Systems    Lipid history: her last LDL done by her PCP was 70 with HDL of 62 She has been on simvastatin for uncertain duration of time      No results found for: CHOL, HDL, LDLCALC, LDLDIRECT, TRIG, CHOLHDL          Hypertension:present for several years and treated with losartan and amlodipine  Musculoskeletal:   She has had significant problem with PMR for the last 3-4 years taking prednisone.      Last diabetic foot exam done in 10/2014 which was normal     Physical Examination:  BP 134/72 mmHg  Pulse 81  Temp(Src) 98.3 F (36.8 C)  Resp 14  Ht 5\' 3"  (1.6 m)  Wt 155 lb 12.8 oz (70.67 kg)  BMI 27.61 kg/m2  SpO2 96%   ASSESSMENT:  Diabetes type 2, uncontrolled with last A1c of 8.1 Currently taking 2 g metformin along with low dose Amaryl in the morning Although blood sugars appear to be relatively better in the morning and before supper by history she says her blood sugars are well over 200 after supper Did not bring her monitor as discussed She has been instructed by dietitian and not clear how consistently she is following the diet; she thinks she is having difficulty avoiding regular soft drinks completely  Is only on small dose prednisone and this should not be causing significant hyperglycemia   PLAN:   Although she is a good candidate for adding Januvia she still wants to try less expensive options  Because of her significantly high readings after supper without morning hyperglycemia she will be changed from Amaryl to GLIPIZIDE 2.5 mg before breakfast and 5 mg before supper  May need to increase evening dose further  Also  discussed that if her only high readings are after supper may need to consider a rapid acting insulin at mealtimes, she is very reluctant to consider this also  She will need to bring her monitor for download on the next visit A1c in about 2 months   Patient Instructions  Please check blood sugars at least half the time about 2 hours after any meal and 3 times per week on waking up. Please bring blood sugar monitor to each visit. Recommended blood sugar levels about 2 hours after meal is 140-180 and on waking up 90-130  Stop Glimeperide and start GLIPIZIDE 1/2 in am and 1 before supper       Nancy Glover 11/19/2014, 9:19 PM   Note: This office note was prepared with Estate agent. Any transcriptional errors that result from this process are unintentional.

## 2014-11-19 NOTE — Patient Instructions (Signed)
Please check blood sugars at least half the time about 2 hours after any meal and 3 times per week on waking up. Please bring blood sugar monitor to each visit. Recommended blood sugar levels about 2 hours after meal is 140-180 and on waking up 90-130  Stop Glimeperide and start GLIPIZIDE 1/2 in am and 1 before supper

## 2014-11-20 ENCOUNTER — Other Ambulatory Visit: Payer: Self-pay | Admitting: *Deleted

## 2014-11-20 MED ORDER — POTASSIUM CHLORIDE CRYS ER 20 MEQ PO TBCR: 20.0000 meq | EXTENDED_RELEASE_TABLET | Freq: Every day | ORAL | Status: DC

## 2014-11-20 NOTE — Progress Notes (Signed)
Quick Note:  Please let patient know that potassium level is low, start K-Dur 20 mEq daily and follow-up with PCP. May need new prescription for this  ______

## 2014-11-21 LAB — FRUCTOSAMINE: Fructosamine: 249 umol/L (ref 190–270)

## 2014-12-01 ENCOUNTER — Other Ambulatory Visit: Payer: Self-pay | Admitting: *Deleted

## 2014-12-01 ENCOUNTER — Telehealth: Payer: Self-pay | Admitting: Endocrinology

## 2014-12-01 MED ORDER — GLIMEPIRIDE 2 MG PO TABS: ORAL_TABLET | ORAL | Status: DC

## 2014-12-01 NOTE — Telephone Encounter (Signed)
Noted, patient is aware, new rx sent for 2 mg

## 2014-12-01 NOTE — Telephone Encounter (Signed)
Patient called stating that the new medication glipizide makes her have side effects: + night sweats + upset stomach  + shaky   Please advise patient   She will be out of town for her next appointment and needs to reschedule   Thank you

## 2014-12-01 NOTE — Telephone Encounter (Signed)
Please see below and advise.

## 2014-12-01 NOTE — Telephone Encounter (Signed)
She can change back to glimepiride but take 2 mg before supper once a day

## 2014-12-04 ENCOUNTER — Ambulatory Visit (INDEPENDENT_AMBULATORY_CARE_PROVIDER_SITE_OTHER): Payer: Medicare Other | Admitting: Endocrinology

## 2014-12-04 ENCOUNTER — Ambulatory Visit: Payer: Self-pay | Admitting: Dietician

## 2014-12-04 ENCOUNTER — Encounter: Payer: Self-pay | Admitting: Endocrinology

## 2014-12-04 ENCOUNTER — Encounter: Payer: Medicare Other | Attending: Endocrinology | Admitting: Dietician

## 2014-12-04 ENCOUNTER — Encounter: Payer: Self-pay | Admitting: Dietician

## 2014-12-04 VITALS — BP 130/84 | HR 90 | Temp 97.7°F | Resp 19 | Ht 63.0 in | Wt 157.0 lb

## 2014-12-04 VITALS — Ht 63.0 in | Wt 157.0 lb

## 2014-12-04 DIAGNOSIS — Z713 Dietary counseling and surveillance: Secondary | ICD-10-CM | POA: Diagnosis not present

## 2014-12-04 DIAGNOSIS — E1165 Type 2 diabetes mellitus with hyperglycemia: Secondary | ICD-10-CM

## 2014-12-04 DIAGNOSIS — E118 Type 2 diabetes mellitus with unspecified complications: Secondary | ICD-10-CM | POA: Diagnosis not present

## 2014-12-04 DIAGNOSIS — IMO0002 Reserved for concepts with insufficient information to code with codable children: Secondary | ICD-10-CM

## 2014-12-04 DIAGNOSIS — I10 Essential (primary) hypertension: Secondary | ICD-10-CM | POA: Diagnosis not present

## 2014-12-04 MED ORDER — REPAGLINIDE 1 MG PO TABS: ORAL_TABLET | ORAL | Status: DC

## 2014-12-04 NOTE — Progress Notes (Signed)
Medical Nutrition Therapy:  Appt start time: 1430 end time:  1500.  Assessment:  11/13/14 Primary concerns today: Patient wants to know what to eat and drink to improve her blood sugar and also to better understand the HgbA1C.  Her last HgbA1C was 8.0% per patient about a month ago.  Patient has had diabetes since 2004.  Checks blood sugar 2-3 times per day (when she remembers).  CBG 125 before breakfast, 136 after lunch, 140 after dinner.  She is on prednisone for polymyalgia.  Patient is here with her husband. Weight today is 155 lbs increased from 144 lbs about 1 1/2 years ago.  Gained weight after fall and leg injury.  UBW prior was 180 lbs.  Weight not stable recently.  She is retired.  Lives with husband.  Patient does the shopping and cooking.  Patient drinks a lot of sweet tea and soda and is reluctant to make changes.  12/04/14: Patient is here for nutrition follow up today alone.  History is difficult to obtain.  Patient reports that am blood sugar was 133 and evening blood sugar runs around 250.  She has changed from regular to diet soda and now "barely" sweetens tea.  Usually only eats about 2 meals and 2 snacks per day depending on time that she awakens.  Breakfast still does not have a protein on most days.  Steal eating cakes and cookies but may have reduced slightly.  Patient seems slightly confused at times.  She was seen by Dr. Dwyane Dee today and will change from Amaryl to Glipizide 2.5 mg before breakfast and 5 mg before supper.    Preferred Learning Style:   No preference indicated   Learning Readiness:   Contemplating   Ready  MEDICATIONS: see list to include Amaryl and Glucophage   DIETARY INTAKE: 24-hr recall: States that she eats more vegetables rather than meat B (8 AM): toast with jelly or 1 pack of instant oatmeal Snk ( AM): cookie L ( PM): sometimes skips or soup with peanut butter crackers Snk ( PM):  D (2-3 PM): corn on the cob (large) bologna with onions and  peppers and angel food cake Snk (4:30 PM): sweet roll, pie, or cake and black coffee Beverages: black coffee, diet coke and diet Mt. Dew, not much water Usual physical activity: ADL's  Estimated energy needs: 1800 calories 200 g carbohydrates 113 g protein 60 g fat  Progress Towards Goal(s):  In progress.   Nutritional Diagnosis:  NB-1.1 Food and nutrition-related knowledge deficit As related to balance of carbohydrates, protein, and fat.  As evidenced by diet hx and increased use of sugar sweetened beverages.    Intervention:  Nutrition counseling and diabetes education continued.  Reviewed importance of regular meals, protein with meals and snacks and healthier options.  Continue to choose beverages without sugar.  Beverages with sugar will make your blood sugar high. Choose meats that are very lean.  Have more beans as protein.  Avoid frying. Breakfast ideas:  1/2 cup cottage cheese and fruit  Whole Wheat Toast with peanut butter, cheese or egg and fruit.  Cook regular oats with fruit and milk  Greek yogurt and fruit Aim for regularly scheduled meals, don't skip.  Consider armchair exercises Aim for half your plate non-starchy vegetables.  Teaching Method Utilized:  Visual Auditory Hands on  Handouts given during visit include:  Meal plan card  Snack list  Barriers to learning/adherence to lifestyle change: seems mildly confused  Demonstrated degree of understanding via:  Teach Back   Monitoring/Evaluation:  Dietary intake, exercise, label reading, and body weight prn.

## 2014-12-04 NOTE — Patient Instructions (Signed)
Continue to choose beverages without sugar.  Beverages with sugar will make your blood sugar high. Choose meats that are very lean.  Have more beans as protein.  Avoid frying. Breakfast ideas:  1/2 cup cottage cheese and fruit  Whole Wheat Toast with peanut butter, cheese or egg and fruit.  Cook regular oats with fruit and milk  Greek yogurt and fruit Aim for regularly scheduled meals, don't skip.  Consider armchair exercises Aim for half your plate non-starchy vegetables.

## 2014-12-04 NOTE — Progress Notes (Signed)
Patient ID: Nancy Glover, female   DOB: 1934/05/07, 81 y.o.   MRN: [id number]           Reason for Appointment: Follow-up for Type 2 Diabetes  Referring physician:Candace Tamala Julian  History of Present Illness:          Diagnosis: Type 2 diabetes mellitus, date of diagnosis: 2004       Past history: patient is unclear about the date and circumstances of her diagnosis. She probably has been on metformin since that time for diagnosis but not clear when her dose was increased to 1 g twice a day No detailed records of her previous level of control are available  Recent history:  The patient is a relatively poor historian and has difficulty following instructions She had refused to consider any brand-name medications because of cost; previously had been tried on Januvia with success  Because of her poor control with metformin alone she was initially started on Amaryl 1 mg in the morning daily However with this her blood sugars were reportedly still over 200 after supper at night even though morning sugars were not high Because of this she was switched to glipizide 2.5 L before breakfast and 5 mg before supper However she claims that with the glipizide she was feeling shaky and weak and had other nonspecific symptoms Because of this she was switched back to Amaryl and told to increase the dose to 2 mg Hypoglycemia:   she has started having a couple of low blood sugars on waking up with symptoms POSTPRANDIAL readings after supper are significantly high although she has only 3 or 4 readings recently  Has seen the dietitian and was advised to make some changes including cutting out regular soft drinks.  She is however still drinking some sweetened ice tea.  Also not watching fat intake and had fast food fried chicken sandwich today       Oral hypoglycemic drugs the patient is taking are: metformin 1 g twice a day, Amaryl 1 mg in the morning      Side effects from medications have been:none     Compliance with the medical regimen: fair  Glucose monitoring:  mostly once a day         Glucometer: Accucheck    Blood Glucose readings by   PRE-MEAL Breakfast Lunch Dinner Bedtime Overall  Glucose range: 59-137  114    243-358   Mean/median:  90      136       Self-care: The diet that the patient has been following is: none Drinks some Cokes or sweetened tea, usually in the early afternoon and has peanuts as snacks  Meals: 2 meals per day. Breakfast is cereal; dinner 6 pm                Dietician visit, most recent:   10/2014 .               Exercise: unable to do much but this trying to do a little walking, has leg pains     Weight history:  Wt Readings from Last 3 Encounters:  12/04/14 157 lb (71.215 kg)  12/04/14 157 lb (71.215 kg)  11/19/14 155 lb 12.8 oz (70.67 kg)    Glycemic control: Last A1c 8.1 done by PCP   No results found for: HGBA1C Lab Results  Component Value Date   CREATININE 0.73 11/19/2014         Medication List  This list is accurate as of: 12/04/14 11:59 PM.  Always use your most recent med list.               ACCU-CHEK AVIVA PLUS test strip  Generic drug:  glucose blood     acetaminophen 500 MG tablet  Commonly known as:  TYLENOL  Take 1,000 mg by mouth every 6 (six) hours as needed (leg pain).     amitriptyline 25 MG tablet  Commonly known as:  ELAVIL     amLODipine 5 MG tablet  Commonly known as:  NORVASC  Take 5 mg by mouth every morning.     CALCIUM + D PO  Take 600 mg by mouth daily.     furosemide 20 MG tablet  Commonly known as:  LASIX  Take 20 mg by mouth daily as needed (leg swelling).     glipiZIDE 5 MG tablet  Commonly known as:  GLUCOTROL  1/2 BEFORE BREAKFAST AND 1 BEFORE SUPPER     HYDROcodone-acetaminophen 5-325 MG per tablet  Commonly known as:  NORCO/VICODIN  Take 1 tablet by mouth every 6 (six) hours as needed for pain.     losartan 100 MG tablet  Commonly known as:  COZAAR  Take 100 mg by  mouth every morning.     metFORMIN 1000 MG tablet  Commonly known as:  GLUCOPHAGE  Take 1,000 mg by mouth 2 (two) times daily with a meal.     omeprazole 20 MG tablet  Commonly known as:  PRILOSEC OTC  Take 20 mg by mouth every morning.     potassium chloride SA 20 MEQ tablet  Commonly known as:  K-DUR,KLOR-CON  Take 1 tablet (20 mEq total) by mouth daily.     predniSONE 1 MG tablet  Commonly known as:  DELTASONE  Take 1 mg by mouth every morning. Take with 5mg  tablet to make 6mg      predniSONE 5 MG tablet  Commonly known as:  DELTASONE  Take 5 mg by mouth every morning. Take with 1mg  tablet to make 6mg      repaglinide 1 MG tablet  Commonly known as:  PRANDIN  before meals: 1 tab before Bfst and lunch and 2 before supper     SANTYL ointment  Generic drug:  collagenase     simvastatin 40 MG tablet  Commonly known as:  ZOCOR  Take 40 mg by mouth every evening.     trimethoprim 100 MG tablet  Commonly known as:  TRIMPEX  Take 100 mg by mouth daily as needed.     vitamin A 10000 UNIT capsule  Take 10,000 Units by mouth daily.     VITAMIN B1-B12 IM  Inject 1,000 mcg/mL into the muscle every 30 (thirty) days.        Allergies:  Allergies  Allergen Reactions  . Actos [Pioglitazone] Hives and Swelling    Body swelling  . Sulfa Antibiotics Swelling    Tongue and mouth swelling    Past Medical History  Diagnosis Date  . Hypertension   . Diabetes mellitus, type 2   . Ulcer of right leg   . Venous insufficiency, peripheral   . Macular degeneration of left eye   . PONV (postoperative nausea and vomiting)   . Hyperlipidemia   . PMR (polymyalgia rheumatica) 2003    Past Surgical History  Procedure Laterality Date  . Cervical biopsy  w/ loop electrode excision  01-17-2001  DR LOMAX  . Cysto/ hod/ bladder bx  10-23-2006  DR Terance Hart  . Cataract extraction w/ intraocular lens implant Right   . Esophagogastroduodenoscopy N/A 09/15/2012    Procedure:  ESOPHAGOGASTRODUODENOSCOPY (EGD);  Surgeon: Winfield Cunas., MD;  Location: [facility];  Service: Endoscopy;  Laterality: N/A;  . Tonsillectomy    . Eye surgery      rt cataract  . Fracture surgery  1975    lt elbow-fx  . Skin split graft Left 04/19/2013    Procedure: SKIN GRAFT FULL THICKNESS TO LEFT LEG FROM ABDOMEN;  Surgeon: Pedro Earls, MD;  Location: [facility];  Service: General;  Laterality: Left;    Family History  Problem Relation Age of Onset  . Colon cancer Father   . Heart disease Father   . Hypertension Father   . Heart attack Father   . Cancer Mother   . Heart disease Mother   . Diabetes Neg Hx     Social History:  reports that she quit smoking about 23 years ago. Her smoking use included Cigarettes. She has a 40 pack-year smoking history. She has never used smokeless tobacco. She reports that she does not drink alcohol or use illicit drugs.    Review of Systems    Lipid history: her last LDL done by her PCP was 70 with HDL of 62 She has been on simvastatin for uncertain duration of time      No results found for: CHOL, HDL, LDLCALC, LDLDIRECT, TRIG, CHOLHDL          Hypertension: present for several years and treated with losartan and amlodipine.  This is being followed by PCP  Musculoskeletal:   She has had significant problem with PMR for the last 3-4 years taking low dose prednisone, currently 3 mg.   She thinks her symptoms are improved recently Will occasionally take hydrocodone    Last diabetic foot exam done in 10/2014 which was normal  She is taking Prilosec for reflux  Physical Examination:  BP 130/84 mmHg  Pulse 90  Temp(Src) 97.7 F (36.5 C) (Oral)  Resp 19  Ht 5\' 3"  (1.6 m)  Wt 157 lb (71.215 kg)  BMI 27.82 kg/m2  SpO2 95%   ASSESSMENT:  Diabetes type 2, uncontrolled with last A1c of 8.1 Currently taking 2 g metformin along with 2 mg Amaryl in the evening Although blood sugars are excellent in the morning  including some readings in the low range she has marked increase in blood sugars after supper, highest reading 358 As discussed in history of present illness she is checking blood sugars mostly in the mornings and minimally later in the day as she forgets  She has been instructed by dietitian but is not consistently  following the diet;  she is having difficulty avoiding regular soft drinks completely and will be seeing the dietitian again today in follow up  Is only on small dose prednisone and not clear if this is contributing to her hyperglycemia; she does not check readings usually in the afternoons   PLAN:   Although she is a good candidate for adding Januvia she still wants to try less expensive options  Also discussed possibility of taking mealtime insulin but she is very reluctant to consider this  Because of her significantly high readings after supper with morning hypoglycemia she will be changed from Amaryl to Prandin 1 mg before breakfast and lunch and 2 mg before supper.  Discussed how this works, timing of medication, expected duration of action and postprandial targets at night  May need to increase evening dose further  A1c to be checked on the next visit  Encouraged her to improve her diet and also increase walking as tolerated  Continue metformin  Check the sugar in the morning only every other day and more readings after meals   Patient Instructions  Check blood sugars on waking up .Marland Kitchen 2-3 .Marland Kitchen times a week Also check blood sugars about 2 hours after a meal and do this after different meals by rotation  Recommended blood sugar levels on waking up is 90-130 and about 2 hours after meal is 140-180 Please bring blood sugar monitor to each visit.  Add Prandin as on rx Stop glimeperide  Counseling time on subjects discussed above is over 50% of today's 25 minute visit   Osmel Dykstra 12/05/2014, 12:56 PM   Note: This office note was prepared with [employer]. Any transcriptional errors that result from this process are unintentional.

## 2014-12-04 NOTE — Patient Instructions (Signed)
Check blood sugars on waking up .Marland Kitchen 2-3 .Marland Kitchen times a week Also check blood sugars about 2 hours after a meal and do this after different meals by rotation  Recommended blood sugar levels on waking up is 90-130 and about 2 hours after meal is 140-180 Please bring blood sugar monitor to each visit.  Add Prandin as on rx Stop glimeperide

## 2014-12-10 ENCOUNTER — Ambulatory Visit: Payer: Self-pay | Admitting: Endocrinology

## 2014-12-11 ENCOUNTER — Ambulatory Visit: Payer: Self-pay | Admitting: Dietician

## 2014-12-29 ENCOUNTER — Other Ambulatory Visit (INDEPENDENT_AMBULATORY_CARE_PROVIDER_SITE_OTHER): Payer: Medicare Other

## 2014-12-29 DIAGNOSIS — E1165 Type 2 diabetes mellitus with hyperglycemia: Secondary | ICD-10-CM | POA: Diagnosis not present

## 2014-12-29 DIAGNOSIS — IMO0002 Reserved for concepts with insufficient information to code with codable children: Secondary | ICD-10-CM

## 2014-12-29 LAB — COMPREHENSIVE METABOLIC PANEL
ALK PHOS: 53 U/L (ref 39–117)
ALT: 10 U/L (ref 0–35)
AST: 12 U/L (ref 0–37)
Albumin: 3.8 g/dL (ref 3.5–5.2)
BUN: 18 mg/dL (ref 6–23)
CHLORIDE: 103 meq/L (ref 96–112)
CO2: 28 mEq/L (ref 19–32)
CREATININE: 0.75 mg/dL (ref 0.40–1.20)
Calcium: 9.8 mg/dL (ref 8.4–10.5)
GFR: 79.2 mL/min (ref >60.00)
Glucose, Bld: 127 mg/dL — ABNORMAL HIGH (ref 70–99)
Potassium: 3.5 mEq/L (ref 3.5–5.1)
Sodium: 137 mEq/L (ref 135–145)
Total Bilirubin: 0.6 mg/dL (ref 0.2–1.2)
Total Protein: 6.3 g/dL (ref 6.0–8.3)

## 2014-12-29 LAB — HEMOGLOBIN A1C: Hgb A1c MFr Bld: 7.5 % — ABNORMAL HIGH (ref 4.6–6.5)

## 2015-01-01 ENCOUNTER — Encounter: Payer: Self-pay | Admitting: Endocrinology

## 2015-01-01 ENCOUNTER — Ambulatory Visit (INDEPENDENT_AMBULATORY_CARE_PROVIDER_SITE_OTHER): Payer: Medicare Other | Admitting: Endocrinology

## 2015-01-01 VITALS — BP 130/70 | HR 93 | Temp 98.7°F | Resp 14 | Ht 63.0 in | Wt 157.6 lb

## 2015-01-01 DIAGNOSIS — E782 Mixed hyperlipidemia: Secondary | ICD-10-CM | POA: Diagnosis not present

## 2015-01-01 DIAGNOSIS — E1165 Type 2 diabetes mellitus with hyperglycemia: Secondary | ICD-10-CM | POA: Diagnosis not present

## 2015-01-01 DIAGNOSIS — IMO0002 Reserved for concepts with insufficient information to code with codable children: Secondary | ICD-10-CM

## 2015-01-01 NOTE — Patient Instructions (Addendum)
Check blood sugars on waking up ..3  .. times a week Also check blood sugars about 2 hours after a meal and do this after different meals by rotation  Recommended blood sugar levels on waking up is 90-130 and about 2 hours after meal is 140-180 Please bring blood sugar monitor to each visit.  STOP GLIMEPERIDE  Check sugar if getting low, if below 70 drink 6oz coke  Take 81mg  aspirin daily with food

## 2015-01-01 NOTE — Progress Notes (Signed)
Patient ID: Nancy Glover, female   DOB: 05-02-1934, 81 y.o.   MRN: [id number]           Reason for Appointment: Follow-up for Type 2 Diabetes  Referring physician:Candace Tamala Julian  History of Present Illness:          Diagnosis: Type 2 diabetes mellitus, date of diagnosis: 2004       Past history: patient is unclear about the date and circumstances of her diagnosis. She probably has been on metformin since that time for diagnosis but not clear when her dose was increased to 1 g twice a day No detailed records of her previous level of control are available  Recent history:  The patient is a relatively poor historian and has difficulty following instructions She refuses to consider any brand-name medications because of cost; previously had been tried on Januvia with success  Because of her poor control with metformin and glipizide along with significantly high readings after her evening meal she was told to change her glipizide to Prandin before meals However despite instructions being given she is still taking glipizide along with the Prandin Did not bring her monitor for download today She says her blood sugars are much better after supper compared to previously when they were going up over 200 frequently Also she says recently on 2 mornings she has woken up in a cold sweat.  She does not think she feels shaky or weak and she does not treat these symptoms with any food or drink; also does not check her blood sugar at this time She did not take her oral hypoglycemic medicines the night before last and this morning she did not have the sweating episode this morning  Hypoglycemia:   possibly in the mornings as above POSTPRANDIAL readings after supper are generally 140-160 by recall  Has seen the dietitian and was advised to make some changes including cutting out regular soft drinks.  She is however still drinking some sweetened ice tea.  Also not watching fat intake and had fast food fried  chicken sandwich today       Oral hypoglycemic drugs the patient is taking are: metformin 1 g twice a day, Prandin 1mg  tid Amaryl 1 mg in the morning      Side effects from medications have been:none   Compliance with the medical regimen: fair  Glucose monitoring:  mostly once a day         Glucometer: Accucheck    Blood Glucose readings by recall   Mean values apply above for all meters except median for One Touch  PRE-MEAL Fasting Lunch Dinner Bedtime Overall  Glucose range: 130   140-160   Mean/median:            Self-care: The diet that the patient has been following is: none Drinks less Cokes or sweetened tea, usually in the early afternoon and has peanuts as snacks  Meals: 2 meals per day. Breakfast is cereal; dinner 6 pm                Dietician visit, most recent:   10/2014 .               Exercise: unable to do much but this trying to do a little walking, has leg pains     Weight history:  Wt Readings from Last 3 Encounters:  01/01/15 157 lb 9.6 oz (71.487 kg)  12/04/14 157 lb (71.215 kg)  12/04/14 157 lb (71.215 kg)  Glycemic control: Previous A1c 8.1 done by PCP    Lab Results  Component Value Date   HGBA1C 7.5* 12/29/2014   Lab Results  Component Value Date   CREATININE 0.75 12/29/2014         Medication List       This list is accurate as of: 01/01/15 11:59 PM.  Always use your most recent med list.               ACCU-CHEK AVIVA PLUS test strip  Generic drug:  glucose blood     acetaminophen 500 MG tablet  Commonly known as:  TYLENOL  Take 1,000 mg by mouth every 6 (six) hours as needed (leg pain).     amitriptyline 25 MG tablet  Commonly known as:  ELAVIL     amLODipine 5 MG tablet  Commonly known as:  NORVASC  Take 5 mg by mouth every morning.     aspirin 81 MG tablet  Take 81 mg by mouth daily.     CALCIUM + D PO  Take 600 mg by mouth daily.     furosemide 20 MG tablet  Commonly known as:  LASIX  Take 20 mg by mouth  daily as needed (leg swelling).     HYDROcodone-acetaminophen 5-325 MG per tablet  Commonly known as:  NORCO/VICODIN  Take 1 tablet by mouth every 6 (six) hours as needed for pain.     losartan 100 MG tablet  Commonly known as:  COZAAR  Take 100 mg by mouth every morning.     metFORMIN 1000 MG tablet  Commonly known as:  GLUCOPHAGE  Take 1,000 mg by mouth 2 (two) times daily with a meal.     omeprazole 20 MG tablet  Commonly known as:  PRILOSEC OTC  Take 20 mg by mouth every morning.     potassium chloride SA 20 MEQ tablet  Commonly known as:  K-DUR,KLOR-CON  Take 1 tablet (20 mEq total) by mouth daily.     predniSONE 1 MG tablet  Commonly known as:  DELTASONE  Take 1 mg by mouth every morning. Take with 5mg  tablet to make 6mg      predniSONE 5 MG tablet  Commonly known as:  DELTASONE  Take 5 mg by mouth every morning. Take with 1mg  tablet to make 6mg      repaglinide 1 MG tablet  Commonly known as:  PRANDIN  before meals: 1 tab before Bfst and lunch and 2 before supper     SANTYL ointment  Generic drug:  collagenase     simvastatin 40 MG tablet  Commonly known as:  ZOCOR  Take 40 mg by mouth every evening.     trimethoprim 100 MG tablet  Commonly known as:  TRIMPEX  Take 100 mg by mouth daily as needed.     vitamin A 10000 UNIT capsule  Take 10,000 Units by mouth daily.     VITAMIN B1-B12 IM  Inject 1,000 mcg/mL into the muscle every 30 (thirty) days.        Allergies:  Allergies  Allergen Reactions  . Actos [Pioglitazone] Hives and Swelling    Body swelling  . Sulfa Antibiotics Swelling    Tongue and mouth swelling    Past Medical History  Diagnosis Date  . Hypertension   . Diabetes mellitus, type 2   . Ulcer of right leg   . Venous insufficiency, peripheral   . Macular degeneration of left eye   . PONV (postoperative nausea and vomiting)   .  Hyperlipidemia   . PMR (polymyalgia rheumatica) 2003    Past Surgical History  Procedure Laterality  Date  . Cervical biopsy  w/ loop electrode excision  01-17-2001  DR LOMAX  . Cysto/ hod/ bladder bx  10-23-2006  DR PETERSON  . Cataract extraction w/ intraocular lens implant Right   . Esophagogastroduodenoscopy N/A 09/15/2012    Procedure: ESOPHAGOGASTRODUODENOSCOPY (EGD);  Surgeon: Winfield Cunas., MD;  Location: [facility];  Service: Endoscopy;  Laterality: N/A;  . Tonsillectomy    . Eye surgery      rt cataract  . Fracture surgery  1975    lt elbow-fx  . Skin split graft Left 04/19/2013    Procedure: SKIN GRAFT FULL THICKNESS TO LEFT LEG FROM ABDOMEN;  Surgeon: Pedro Earls, MD;  Location: [facility];  Service: General;  Laterality: Left;    Family History  Problem Relation Age of Onset  . Colon cancer Father   . Heart disease Father   . Hypertension Father   . Heart attack Father   . Cancer Mother   . Heart disease Mother   . Diabetes Neg Hx     Social History:  reports that she quit smoking about 23 years ago. Her smoking use included Cigarettes. She has a 40 pack-year smoking history. She has never used smokeless tobacco. She reports that she does not drink alcohol or use illicit drugs.    Review of Systems    Lipid history: her last LDL done by her PCP was 70 with HDL of 62 She has been on simvastatin for uncertain duration of time      No results found for: CHOL, HDL, LDLCALC, LDLDIRECT, TRIG, CHOLHDL          Hypertension: present for several years and treated with losartan and amlodipine.  This is being followed by PCP  Musculoskeletal:   She has had significant problem with PMR for the last 3-4 years taking low dose prednisone, currently 6 mg.   She thinks her symptoms are controlled Will occasionally take hydrocodone    Last diabetic foot exam done in 10/2014 which was normal  She is taking Prilosec for reflux  Physical Examination:  BP 130/70 mmHg  Pulse 93  Temp(Src) 98.7 F (37.1 C) (Oral)  Resp 14  Ht 5\' 3"  (1.6 m)  Wt 157  lb 9.6 oz (71.487 kg)  BMI 27.92 kg/m2  SpO2 95%   ASSESSMENT:  Diabetes type 2, uncontrolled  Currently taking 2 g metformin along with Prandin and glipizide Although she was told to stop her glipizide with starting Prandin she did not reduce her instructions and is still taking both together She thinks her blood sugars are much better after supper which were previously as high as 358 However she appears to have had hypoglycemic episodes on waking up in the morning this week although she did not check her blood sugars at those times  Her A1c is slightly better but probably will improve further since her regimen was changed less than a month ago  Is only on small dose prednisone in the mornings and does need to check blood sugars after her breakfast and lunch to see they are higher also  HYPERLIPIDEMIA: Will need follow-up lipids  PLAN:   Stop glipizide and continue Prandin before meals; may need to adjust the dose of Prandin based on postprandial readings on the next visit  Discussed hypoglycemia and she will need to check her blood sugar if she has sweating  episodes, discussed treatment of hypoglycemia  Encouraged her to improve her diet as discussed with the dietitian and also increase walking as tolerated  Continue metformin  Check the sugar in the morning only every other day and more readings after meals including after breakfast and supper and bring monitor for download on each visit  Discussed blood sugar targets at various times  She is recommended 81 mg aspirin with food for cardia vascular prophylaxis because of her history of PVD and other risk factors  Recheck lipids on the next visit   Patient Instructions  Check blood sugars on waking up ..3  .. times a week Also check blood sugars about 2 hours after a meal and do this after different meals by rotation  Recommended blood sugar levels on waking up is 90-130 and about 2 hours after meal is 140-180 Please bring  blood sugar monitor to each visit.  STOP GLIMEPERIDE  Check sugar if getting low, if below 70 drink 6oz coke  Take 81mg  aspirin daily with food    Counseling time on subjects discussed above is over 50% of today's 25 minute visit    Broady Lafoy 01/02/2015, 9:27 AM   Note: This office note was prepared with Estate agent. Any transcriptional errors that result from this process are unintentional.

## 2015-01-12 ENCOUNTER — Other Ambulatory Visit: Payer: Self-pay

## 2015-03-04 ENCOUNTER — Encounter: Payer: Self-pay | Admitting: Endocrinology

## 2015-03-04 ENCOUNTER — Ambulatory Visit (INDEPENDENT_AMBULATORY_CARE_PROVIDER_SITE_OTHER): Payer: Medicare Other | Admitting: Endocrinology

## 2015-03-04 ENCOUNTER — Other Ambulatory Visit: Payer: Medicare Other

## 2015-03-04 VITALS — BP 130/62 | HR 88 | Temp 98.1°F | Resp 16 | Ht 63.0 in | Wt 161.6 lb

## 2015-03-04 DIAGNOSIS — E782 Mixed hyperlipidemia: Secondary | ICD-10-CM | POA: Diagnosis not present

## 2015-03-04 DIAGNOSIS — R5383 Other fatigue: Secondary | ICD-10-CM | POA: Diagnosis not present

## 2015-03-04 DIAGNOSIS — E1165 Type 2 diabetes mellitus with hyperglycemia: Secondary | ICD-10-CM

## 2015-03-04 DIAGNOSIS — IMO0002 Reserved for concepts with insufficient information to code with codable children: Secondary | ICD-10-CM

## 2015-03-04 LAB — BASIC METABOLIC PANEL
BUN: 16 mg/dL (ref 6–23)
CHLORIDE: 106 meq/L (ref 96–112)
CO2: 26 meq/L (ref 19–32)
Calcium: 9.1 mg/dL (ref 8.4–10.5)
Creatinine, Ser: 0.77 mg/dL (ref 0.40–1.20)
GFR: 76.8 mL/min (ref >60.00)
GLUCOSE: 127 mg/dL — AB (ref 70–99)
POTASSIUM: 3.4 meq/L — AB (ref 3.5–5.1)
SODIUM: 141 meq/L (ref 135–145)

## 2015-03-04 LAB — LIPID PANEL
CHOL/HDL RATIO: 3
Cholesterol: 153 mg/dL (ref 0–200)
HDL: 53.3 mg/dL (ref >39.00)
LDL CALC: 64 mg/dL (ref 0–99)
NonHDL: 99.6
TRIGLYCERIDES: 180 mg/dL — AB (ref 0.0–149.0)
VLDL: 36 mg/dL (ref 0.0–40.0)

## 2015-03-04 LAB — TSH: TSH: 0.39 u[IU]/mL (ref 0.35–4.50)

## 2015-03-04 MED ORDER — GLUCOSE BLOOD VI STRP
ORAL_STRIP | Status: DC
Start: 2015-03-04 — End: 2017-01-05

## 2015-03-04 NOTE — Progress Notes (Signed)
Patient ID: Nancy Glover, female   DOB: [birth year], 81 y.o.   MRN: [id number]           Reason for Appointment: Follow-up for Type 2 Diabetes  Referring physician:Candace Tamala Julian  History of Present Illness:          Diagnosis: Type 2 diabetes mellitus, date of diagnosis: 2004       Past history: patient is unclear about the date and circumstances of her diagnosis. She probably has been on metformin since that time for diagnosis but not clear when her dose was increased to 1 g twice a day No detailed records of her previous level of control are available  Recent history:  The patient is a relatively poor historian and at times has difficulty following instructions She refuses to consider any brand-name medications because of cost; previously had been tried on Januvia with success  Because of her poor control with metformin and glipizide along with significantly high readings after her evening meal she was told to change her glipizide to Prandin before meals She does think that occasionally she will forget to take the Prandin before eating and is asking about when to take it Did not bring her monitor for download today despite reminders to have her bring this every time She says her blood sugars are usually fairly good in the morning and slightly higher after meals but not clear how often she has monitored after meals Usually does not monitor after breakfast and rarely after lunch Her last A1c was 7.6  Hypoglycemia:   none  Has seen the dietitian and was advised to make some changes including cutting out regular soft drinks.  She is however still drinking some sweetened ice tea.  Also not watching fat intake and had fast food fried chicken sandwich today       Oral hypoglycemic drugs the patient is taking are: metformin 1 g twice a day, Prandin 1 mg tid Amaryl 1 mg in the morning      Side effects from medications have been:none   Compliance with the medical regimen: fair  Glucose  monitoring:  mostly once a day         Glucometer: Accucheck    Blood Glucose readings by recall    PRE-MEAL Fasting pc Lunch PC Dinner Bedtime Overall  Glucose range: 107-114 150 170-180    Mean/median:            Self-care: The diet that the patient has been following is: none Drinks less Cokes or sweetened tea, usually in the early afternoon and has peanuts as snacks  Meals: 2 meals per day. Breakfast is cereal/oatmeal; dinner 6 pm                Dietician visit, most recent:   10/2014 .               Exercise: unable to do much but this trying to do a little walking, has leg pains     Weight history:  Wt Readings from Last 3 Encounters:  03/04/15 161 lb 9.6 oz (73.301 kg)  01/01/15 157 lb 9.6 oz (71.487 kg)  12/04/14 157 lb (71.215 kg)    Glycemic control: Previous A1c 8.1 done by PCP    Lab Results  Component Value Date   HGBA1C 7.5* 12/29/2014   Lab Results  Component Value Date   CREATININE 0.75 12/29/2014         Medication List       This  list is accurate as of: 03/04/15  3:37 PM.  Always use your most recent med list.               acetaminophen 500 MG tablet  Commonly known as:  TYLENOL  Take 1,000 mg by mouth every 6 (six) hours as needed (leg pain).     amitriptyline 25 MG tablet  Commonly known as:  ELAVIL     amLODipine 5 MG tablet  Commonly known as:  NORVASC  Take 5 mg by mouth every morning.     aspirin 81 MG tablet  Take 81 mg by mouth daily.     CALCIUM + D PO  Take 600 mg by mouth daily.     furosemide 20 MG tablet  Commonly known as:  LASIX  Take 20 mg by mouth daily as needed (leg swelling).     glucose blood test strip  Commonly known as:  ACCU-CHEK AVIVA PLUS  Check upto 2x daily     HYDROcodone-acetaminophen 5-325 MG per tablet  Commonly known as:  NORCO/VICODIN  Take 1 tablet by mouth every 6 (six) hours as needed for pain.     losartan 100 MG tablet  Commonly known as:  COZAAR  Take 100 mg by mouth every  morning.     metFORMIN 1000 MG tablet  Commonly known as:  GLUCOPHAGE  Take 1,000 mg by mouth 2 (two) times daily with a meal.     omeprazole 20 MG tablet  Commonly known as:  PRILOSEC OTC  Take 20 mg by mouth every morning.     potassium chloride SA 20 MEQ tablet  Commonly known as:  K-DUR,KLOR-CON  Take 1 tablet (20 mEq total) by mouth daily.     predniSONE 1 MG tablet  Commonly known as:  DELTASONE  Take 1 mg by mouth every morning. Take with 5mg  tablet to make 6mg      predniSONE 5 MG tablet  Commonly known as:  DELTASONE  Take 5 mg by mouth every morning. Take with 1mg  tablet to make 6mg      repaglinide 1 MG tablet  Commonly known as:  PRANDIN  before meals: 1 tab before Bfst and lunch and 2 before supper     SANTYL ointment  Generic drug:  collagenase     simvastatin 40 MG tablet  Commonly known as:  ZOCOR  Take 40 mg by mouth every evening.     trimethoprim 100 MG tablet  Commonly known as:  TRIMPEX  Take 100 mg by mouth daily as needed.     vitamin A 10000 UNIT capsule  Take 10,000 Units by mouth daily.     VITAMIN B1-B12 IM  Inject 1,000 mcg/mL into the muscle every 30 (thirty) days.        Allergies:  Allergies  Allergen Reactions  . Actos [Pioglitazone] Hives and Swelling    Body swelling  . Sulfa Antibiotics Swelling    Tongue and mouth swelling    Past Medical History  Diagnosis Date  . Hypertension   . Diabetes mellitus, type 2   . Ulcer of right leg   . Venous insufficiency, peripheral   . Macular degeneration of left eye   . PONV (postoperative nausea and vomiting)   . Hyperlipidemia   . PMR (polymyalgia rheumatica) 2003    Past Surgical History  Procedure Laterality Date  . Cervical biopsy  w/ loop electrode excision  01-17-2001  DR LOMAX  . Cysto/ hod/ bladder bx  10-23-2006  DR  PETERSON  . Cataract extraction w/ intraocular lens implant Right   . Esophagogastroduodenoscopy N/A 09/15/2012    Procedure: ESOPHAGOGASTRODUODENOSCOPY  (EGD);  Surgeon: Winfield Cunas., MD;  Location: [facility];  Service: Endoscopy;  Laterality: N/A;  . Tonsillectomy    . Eye surgery      rt cataract  . Fracture surgery  1975    lt elbow-fx  . Skin split graft Left 04/19/2013    Procedure: SKIN GRAFT FULL THICKNESS TO LEFT LEG FROM ABDOMEN;  Surgeon: Pedro Earls, MD;  Location: [facility];  Service: General;  Laterality: Left;    Family History  Problem Relation Age of Onset  . Colon cancer Father   . Heart disease Father   . Hypertension Father   . Heart attack Father   . Cancer Mother   . Heart disease Mother   . Diabetes Neg Hx     Social History:  reports that she quit smoking about 23 years ago. Her smoking use included Cigarettes. She has a 40 pack-year smoking history. She has never used smokeless tobacco. She reports that she does not drink alcohol or use illicit drugs.    Review of Systems    Lipid history: her last LDL done by her PCP was 70 with HDL of 62 She has been on simvastatin for uncertain duration of time      No results found for: CHOL, HDL, LDLCALC, LDLDIRECT, TRIG, CHOLHDL          Hypertension: present for several years and treated with losartan and amlodipine.  This is being followed by PCP  Musculoskeletal:   She has had significant problem with PMR for the last 3-4 years taking low dose prednisone, currently 6 mg.   She thinks her symptoms are controlled Will occasionally take hydrocodone    Last diabetic foot exam done in 10/2014 which was normal  Complaining of easy fatigability   Physical Examination:  BP 130/62 mmHg  Pulse 88  Temp(Src) 98.1 F (36.7 C)  Resp 16  Ht 5\' 3"  (1.6 m)  Wt 161 lb 9.6 oz (73.301 kg)  BMI 28.63 kg/m2  SpO2 97%   ASSESSMENT:  Diabetes type 2 Currently taking 2 g metformin along with Prandin 1 mg before meals Although unable to assess her diabetes objectively today she thinks her blood sugars are fairly good after meals and  usually about 170 after her evening meal. She again does not bring her monitor for review and confirmation of her blood sugars but they appear to be better than before Also she reports relatively normal blood sugars in the mornings She can be a little better compliant with taking her Prandin before eating and discussed timing of this medication  HYPERLIPIDEMIA: Will need follow-up lipids  Fatigue: Needs to have thyroid levels evaluated, will deferred to PCP  PLAN:   No change in medications at this time  Check A1c on the next visit  Recheck lipids on the next visit   Patient Instructions  Check blood sugars on waking up .Marland Kitchen 1-2 .. times a week Also check blood sugars about 2 hours after a meal and do this after different meals by rotation  Recommended blood sugar levels on waking up is 90-130 and about 2 hours after meal is 140-180 Please bring blood sugar monitor to each visit.  Try taking Prandin upto 30 min before meals     Counseling time on subjects discussed above is over 50% of today's 25 minute visit  [facility] 03/04/2015, 3:37 PM   Note: This office note was prepared with Dragon voice recognition system technology. Any transcriptional errors that result from this process are unintentional.

## 2015-03-04 NOTE — Patient Instructions (Signed)
Check blood sugars on waking up .Marland Kitchen 1-2 .. times a week Also check blood sugars about 2 hours after a meal and do this after different meals by rotation  Recommended blood sugar levels on waking up is 90-130 and about 2 hours after meal is 140-180 Please bring blood sugar monitor to each visit.  Try taking Prandin upto 30 min before meals

## 2015-03-05 LAB — FRUCTOSAMINE: Fructosamine: 249 umol/L (ref 0–285)

## 2015-05-17 ENCOUNTER — Emergency Department (HOSPITAL_COMMUNITY)
Admission: EM | Admit: 2015-05-17 | Discharge: 2015-05-17 | Disposition: A | Discharge status: Home / Self Care | Payer: Medicare Other | Attending: Emergency Medicine | Admitting: Emergency Medicine

## 2015-05-17 ENCOUNTER — Encounter (HOSPITAL_COMMUNITY): Payer: Self-pay

## 2015-05-17 ENCOUNTER — Emergency Department (HOSPITAL_COMMUNITY): Payer: Medicare Other

## 2015-05-17 DIAGNOSIS — Z87720 Personal history of (corrected) congenital malformations of eye: Secondary | ICD-10-CM | POA: Insufficient documentation

## 2015-05-17 DIAGNOSIS — E785 Hyperlipidemia, unspecified: Secondary | ICD-10-CM | POA: Insufficient documentation

## 2015-05-17 DIAGNOSIS — Z87891 Personal history of nicotine dependence: Secondary | ICD-10-CM | POA: Diagnosis not present

## 2015-05-17 DIAGNOSIS — Z8739 Personal history of other diseases of the musculoskeletal system and connective tissue: Secondary | ICD-10-CM | POA: Insufficient documentation

## 2015-05-17 DIAGNOSIS — J029 Acute pharyngitis, unspecified: Secondary | ICD-10-CM | POA: Diagnosis present

## 2015-05-17 DIAGNOSIS — Z7952 Long term (current) use of systemic steroids: Secondary | ICD-10-CM | POA: Insufficient documentation

## 2015-05-17 DIAGNOSIS — R739 Hyperglycemia, unspecified: Secondary | ICD-10-CM

## 2015-05-17 DIAGNOSIS — R42 Dizziness and giddiness: Secondary | ICD-10-CM | POA: Diagnosis not present

## 2015-05-17 DIAGNOSIS — J069 Acute upper respiratory infection, unspecified: Secondary | ICD-10-CM

## 2015-05-17 DIAGNOSIS — I10 Essential (primary) hypertension: Secondary | ICD-10-CM | POA: Insufficient documentation

## 2015-05-17 DIAGNOSIS — Z79899 Other long term (current) drug therapy: Secondary | ICD-10-CM | POA: Insufficient documentation

## 2015-05-17 DIAGNOSIS — Z872 Personal history of diseases of the skin and subcutaneous tissue: Secondary | ICD-10-CM | POA: Insufficient documentation

## 2015-05-17 DIAGNOSIS — Z7984 Long term (current) use of oral hypoglycemic drugs: Secondary | ICD-10-CM | POA: Diagnosis not present

## 2015-05-17 DIAGNOSIS — Z8679 Personal history of other diseases of the circulatory system: Secondary | ICD-10-CM | POA: Insufficient documentation

## 2015-05-17 DIAGNOSIS — Z7982 Long term (current) use of aspirin: Secondary | ICD-10-CM | POA: Insufficient documentation

## 2015-05-17 DIAGNOSIS — E1165 Type 2 diabetes mellitus with hyperglycemia: Secondary | ICD-10-CM | POA: Insufficient documentation

## 2015-05-17 LAB — BASIC METABOLIC PANEL
Anion gap: 12 (ref 5–15)
BUN: 17 mg/dL (ref 6–20)
CHLORIDE: 99 mmol/L — AB (ref 101–111)
CO2: 23 mmol/L (ref 22–32)
CREATININE: 1.33 mg/dL — AB (ref 0.44–1.00)
Calcium: 8.3 mg/dL — ABNORMAL LOW (ref 8.9–10.3)
GFR calc Af Amer: 43 mL/min — ABNORMAL LOW (ref >60)
GFR calc non Af Amer: 37 mL/min — ABNORMAL LOW (ref >60)
Glucose, Bld: 346 mg/dL — ABNORMAL HIGH (ref 65–99)
POTASSIUM: 3.3 mmol/L — AB (ref 3.5–5.1)
Sodium: 134 mmol/L — ABNORMAL LOW (ref 135–145)

## 2015-05-17 LAB — URINALYSIS, ROUTINE W REFLEX MICROSCOPIC
GLUCOSE, UA: 100 mg/dL — AB
Hgb urine dipstick: NEGATIVE
Ketones, ur: 40 mg/dL — AB
LEUKOCYTES UA: NEGATIVE
NITRITE: NEGATIVE
PH: 6.5 (ref 5.0–8.0)
Protein, ur: 100 mg/dL — AB
Specific Gravity, Urine: 1.017 (ref 1.005–1.030)
Urobilinogen, UA: 1 mg/dL (ref 0.0–1.0)

## 2015-05-17 LAB — CBC WITH DIFFERENTIAL/PLATELET
Basophils Absolute: 0 10*3/uL (ref 0.0–0.1)
Basophils Relative: 0 %
Eosinophils Absolute: 0 10*3/uL (ref 0.0–0.7)
Eosinophils Relative: 0 %
HEMATOCRIT: 32.7 % — AB (ref 36.0–46.0)
HEMOGLOBIN: 10.3 g/dL — AB (ref 12.0–15.0)
LYMPHS ABS: 1.7 10*3/uL (ref 0.7–4.0)
LYMPHS PCT: 15 %
MCH: 22.1 pg — AB (ref 26.0–34.0)
MCHC: 31.5 g/dL (ref 30.0–36.0)
MCV: 70.2 fL — AB (ref 78.0–100.0)
MONOS PCT: 14 %
Monocytes Absolute: 1.6 10*3/uL — ABNORMAL HIGH (ref 0.1–1.0)
NEUTROS ABS: 7.9 10*3/uL — AB (ref 1.7–7.7)
NEUTROS PCT: 71 %
Platelets: 236 10*3/uL (ref 150–400)
RBC: 4.66 MIL/uL (ref 3.87–5.11)
RDW: 19.7 % — ABNORMAL HIGH (ref 11.5–15.5)
WBC: 11.2 10*3/uL — ABNORMAL HIGH (ref 4.0–10.5)

## 2015-05-17 LAB — URINE MICROSCOPIC-ADD ON

## 2015-05-17 LAB — CBG MONITORING, ED: GLUCOSE-CAPILLARY: 187 mg/dL — AB (ref 65–99)

## 2015-05-17 LAB — RAPID STREP SCREEN (MED CTR MEBANE ONLY): Streptococcus, Group A Screen (Direct): NEGATIVE

## 2015-05-17 MED ORDER — LEVOFLOXACIN 500 MG PO TABS: 500.0000 mg | ORAL_TABLET | Freq: Every day | ORAL | Status: DC

## 2015-05-17 MED ORDER — ACETAMINOPHEN 500 MG PO TABS
1000.0000 mg | ORAL_TABLET | Freq: Once | ORAL | Status: AC
  Administered 2015-05-17: 1000 mg via ORAL
  Filled 2015-05-17: qty 2

## 2015-05-17 MED ORDER — LEVOFLOXACIN 500 MG PO TABS
500.0000 mg | ORAL_TABLET | Freq: Once | ORAL | Status: AC
  Administered 2015-05-17: 500 mg via ORAL
  Filled 2015-05-17: qty 1

## 2015-05-17 MED ORDER — SODIUM CHLORIDE 0.9 % IV BOLUS (SEPSIS)
1000.0000 mL | Freq: Once | INTRAVENOUS | Status: AC
  Administered 2015-05-17: 1000 mL via INTRAVENOUS

## 2015-05-17 NOTE — ED Provider Notes (Signed)
CSN: [id number]     Arrival date & time 05/17/15  1316 History   First MD Initiated Contact with Patient 05/17/15 1723     Chief Complaint  Patient presents with  . Sore Throat  . Cough  . Dizziness     (Consider location/radiation/quality/duration/timing/severity/associated sxs/prior Treatment) HPI  PCP: [name], MD PMH: hypertension, diabetes, ulcer of right leg, venous insuff, macular degeneration of left eye, PONV, hyperlipidemia, PMR  Nancy Glover is a 81 y.o.  female  Comes to the ER brought in by her husband for evaluation of mild headache, sore throat, non productive cough and shortness of breath for 1 week. She has been taking expired promethazine-DM and is unclear as to how much she has been taking. Her husband feels that she is taking too many and the patient doesn't agree and says just 1 tablespoon every 4-6 hours. He tried to get her to see her PCP on Friday but the patient refused. At baseline she forgets small things but reports last night she got dressed and acted as though she was going to work. The patient does not remember this and the husband says that has not happened before, no other associated abnormalities. The patient has not been confused since then and is currently at baseline.   She denies diaphoresis, fever, weakness (general or focal),  change of vision,  dysphagia, aphagia,  abdominal pains, nausea, vomiting, diarrhea, lower extremity swelling, rash, neck pain, chest pain, ataxia, respiratory distress, loc, polyuria, dysuria, abnormal bleeding.   Past Medical History  Diagnosis Date  . Hypertension   . Diabetes mellitus, type 2 ([place])   . Ulcer of right leg ([place])   . Venous insufficiency, peripheral   . Macular degeneration of left eye   . PONV (postoperative nausea and vomiting)   . Hyperlipidemia   . PMR (polymyalgia rheumatica) ([place]) 2003   Past Surgical History  Procedure Laterality Date  . Cervical biopsy  w/ loop electrode  excision  01-17-2001  DR LOMAX  . Cysto/ hod/ bladder bx  10-23-2006  DR PETERSON  . Cataract extraction w/ intraocular lens implant Right   . Esophagogastroduodenoscopy N/A 09/15/2012    Procedure: ESOPHAGOGASTRODUODENOSCOPY (EGD);  Surgeon: Winfield Cunas., MD;  Location: [facility];  Service: Endoscopy;  Laterality: N/A;  . Tonsillectomy    . Eye surgery      rt cataract  . Fracture surgery  1975    lt elbow-fx  . Skin split graft Left 04/19/2013    Procedure: SKIN GRAFT FULL THICKNESS TO LEFT LEG FROM ABDOMEN;  Surgeon: Pedro Earls, MD;  Location: [facility];  Service: General;  Laterality: Left;   Family History  Problem Relation Age of Onset  . Colon cancer Father   . Heart disease Father   . Hypertension Father   . Heart attack Father   . Cancer Mother   . Heart disease Mother   . Diabetes Neg Hx    Social History  Substance Use Topics  . Smoking status: Former Smoker -- 1.00 packs/day for 40 years    Types: Cigarettes    Quit date: 09/13/1991  . Smokeless tobacco: Never Used  . Alcohol Use: No   OB History    No data available     Review of Systems  10 Systems reviewed and are negative for acute change except as noted in the HPI.     Allergies  Actos and Sulfa antibiotics  Home Medications   Prior to  Admission medications   Medication Sig Start Date End Date Taking? Authorizing Provider  acetaminophen (TYLENOL) 500 MG tablet Take 1,000 mg by mouth every 6 (six) hours as needed (leg pain).   Yes Historical Provider, MD  amitriptyline (ELAVIL) 25 MG tablet Take 25 mg by mouth daily.  05/17/13  Yes Historical Provider, MD  amLODipine (NORVASC) 5 MG tablet Take 5 mg by mouth every morning.   Yes Historical Provider, MD  aspirin 325 MG tablet Take 325 mg by mouth every 6 (six) hours as needed for moderate pain or headache.   Yes Historical Provider, MD  Calcium Carbonate-Vitamin D (CALCIUM + D PO) Take 600 mg by mouth daily.   Yes  Historical Provider, MD  furosemide (LASIX) 20 MG tablet Take 20 mg by mouth daily as needed (leg swelling).    Yes Historical Provider, MD  glucose blood (ACCU-CHEK AVIVA PLUS) test strip Check upto 2x daily 03/04/15  Yes Elayne Snare, MD  HYDROcodone-acetaminophen (NORCO/VICODIN) 5-325 MG tablet Take 1 tablet by mouth every 6 (six) hours as needed for moderate pain.   Yes Historical Provider, MD  ibuprofen (ADVIL,MOTRIN) 200 MG tablet Take 200-400 mg by mouth every 6 (six) hours as needed for headache.   Yes Historical Provider, MD  losartan (COZAAR) 100 MG tablet Take 100 mg by mouth every morning.   Yes Historical Provider, MD  metFORMIN (GLUCOPHAGE) 1000 MG tablet Take 1,000 mg by mouth 2 (two) times daily with a meal.   Yes Historical Provider, MD  omeprazole (PRILOSEC OTC) 20 MG tablet Take 20 mg by mouth every morning.    Yes Historical Provider, MD  potassium chloride SA (K-DUR,KLOR-CON) 20 MEQ tablet Take 1 tablet (20 mEq total) by mouth daily. 11/20/14  Yes Elayne Snare, MD  predniSONE (DELTASONE) 1 MG tablet Take 1 mg by mouth every morning. Take with 5mg  tablet to make 6mg    Yes Historical Provider, MD  predniSONE (DELTASONE) 5 MG tablet Take 5 mg by mouth every morning. Take with 1mg  tablet to make 6mg    Yes Historical Provider, MD  repaglinide (PRANDIN) 1 MG tablet before meals: 1 tab before Bfst and lunch and 2 before supper Patient taking differently: Take 1-2 mg by mouth 3 (three) times daily before meals. before meals: 1 tab before Bfst and lunch and 2 before supper 12/04/14  Yes Elayne Snare, MD  simvastatin (ZOCOR) 40 MG tablet Take 40 mg by mouth every evening.   Yes Historical Provider, MD  trimethoprim (TRIMPEX) 100 MG tablet Take 100 mg by mouth daily as needed (for irritation (uti) take for abour 3-4 days).    Yes Historical Provider, MD  VITAMIN B1-B12 IM Inject 1,000 mcg/mL into the muscle every 30 (thirty) days.   Yes Historical Provider, MD  levofloxacin (LEVAQUIN) 500 MG tablet  Take 1 tablet (500 mg total) by mouth daily. 05/17/15   Egor Fullilove Carlota Raspberry, PA-C   BP 123/48 mmHg  Pulse 86  Temp(Src) 98 F (36.7 C) (Oral)  Resp 20  SpO2 94% Physical Exam  Constitutional: She is oriented to person, place, and time. She appears well-developed and well-nourished. No distress.  HENT:  Head: Normocephalic and atraumatic.  Right Ear: Tympanic membrane and ear canal normal.  Left Ear: Tympanic membrane and ear canal normal.  Nose: Nose normal. Right sinus exhibits no maxillary sinus tenderness and no frontal sinus tenderness. Left sinus exhibits no maxillary sinus tenderness and no frontal sinus tenderness.  Mouth/Throat: Uvula is midline. Posterior oropharyngeal erythema present. No oropharyngeal exudate,  posterior oropharyngeal edema or tonsillar abscesses.  Green tube in left ear  Eyes: Pupils are equal, round, and reactive to light.  Neck: Normal range of motion. Neck supple. No spinous process tenderness and no muscular tenderness present.  Cardiovascular: Normal rate and regular rhythm.   Pulmonary/Chest: Effort normal and breath sounds normal. No accessory muscle usage. No tachypnea and no bradypnea. No respiratory distress. She has no wheezes. She has no rhonchi.  Abdominal: Soft. Bowel sounds are normal. She exhibits distension. There is no tenderness.  Musculoskeletal:  No LE swelling  Neurological: She is alert and oriented to person, place, and time.  Cranial nerves II-VIII and X-XII evaluated and show no deficits. Pt alert and oriented x 3 Upper and lower extremity strength is symmetrical and physiologic Normal muscular tone No facial droop Coordination intact, no limb ataxia, finger-nose-finger normal No pronator drift  Skin: Skin is warm and dry.  No rash  Nursing note and vitals reviewed.   ED Course  Procedures (including critical care time) Labs Review Labs Reviewed  CBC WITH DIFFERENTIAL/PLATELET - Abnormal; Notable for the following:    WBC 11.2  (*)    Hemoglobin 10.3 (*)    HCT 32.7 (*)    MCV 70.2 (*)    MCH 22.1 (*)    RDW 19.7 (*)    Neutro Abs 7.9 (*)    Monocytes Absolute 1.6 (*)    All other components within normal limits  BASIC METABOLIC PANEL - Abnormal; Notable for the following:    Sodium 134 (*)    Potassium 3.3 (*)    Chloride 99 (*)    Glucose, Bld 346 (*)    Creatinine, Ser 1.33 (*)    Calcium 8.3 (*)    GFR calc non Af Amer 37 (*)    GFR calc Af Amer 43 (*)    All other components within normal limits  URINALYSIS, ROUTINE W REFLEX MICROSCOPIC (NOT AT [facility]) - Abnormal; Notable for the following:    Glucose, UA 100 (*)    Bilirubin Urine SMALL (*)    Ketones, ur 40 (*)    Protein, ur 100 (*)    All other components within normal limits  CBG MONITORING, ED - Abnormal; Notable for the following:    Glucose-Capillary 187 (*)    All other components within normal limits  RAPID STREP SCREEN (NOT AT [facility])  CULTURE, GROUP A STREP  URINE MICROSCOPIC-ADD ON    Imaging Review Dg Chest 2 View  05/17/2015  CLINICAL DATA:  Chest congestion, shortness of breath EXAM: CHEST  2 VIEW COMPARISON:  06/02/2014 FINDINGS: Chronic interstitial markings. No focal consolidation. No pleural effusion or pneumothorax. The heart is normal in size. Mild degenerative changes of the visualized thoracolumbar spine. Vascular calcifications. IMPRESSION: No evidence of acute cardiopulmonary disease. Electronically Signed   By: Julian Hy M.D.   On: 05/17/2015 14:09   I have personally reviewed and evaluated these images and lab results as part of my medical decision-making.   EKG Interpretation None      MDM   Final diagnoses:  URI (upper respiratory infection)  Hyperglycemia    Patient was found to be hyperglycemic in the ED, glucose of 346, she was hydrated with IV fluids and her glucose improved to 187. No infection on urinalysis or chest xray. CBC is not acute, strep screen is negative. She has no anion gap. Dr. Jeanell Sparrow  saw patient with me as well prior to discharge. She was sleeping and had  to be woken up. She reports feeling much better than when she originally got here and is now getting some rest. Dr. Jeanell Sparrow recommends ambulating the patient for strength assessment and Fluid challenge.  Author: Elwanda Brooklyn, NT Service: (none) Author Type: Nurse Tech    Filed: 05/17/2015 10:36 PM Note Time: 05/17/2015 10:31 PM Status: Addendum   Editor: Elwanda Brooklyn, NT (Nurse Tech)     Related Notes: Original Note by Elwanda Brooklyn, NT (Nurse Tech) filed at 05/17/2015 10:36 PM   Expand All Collapse All   Pt ambulated with writer, steady gait with a walker. Writer also provided pt with a cup of sprite zero for PO Challenge      Patient fluid challenged with Sprite Zero and drank a half of the cup without any adverse events. Patient will given sent home with an Rx for Levaquin, she is to see her PCP Dr Tamala Julian tomorrow. If she is not feeling better or unable to see her PCP she is to return to [facility] ED or [facility] ED.  Medications  acetaminophen (TYLENOL) tablet 1,000 mg (1,000 mg Oral Given 05/17/15 1742)  sodium chloride 0.9 % bolus 1,000 mL (0 mLs Intravenous Stopped 05/17/15 2204)  levofloxacin (LEVAQUIN) tablet 500 mg (500 mg Oral Given 05/17/15 2308)    81 y.o.Orva Riles Tozer's medical screening exam was performed and I feel the patient has had an appropriate workup for their chief complaint at this time and likelihood of emergent condition existing is low. They have been counseled on decision, discharge, follow up and which symptoms necessitate immediate return to the emergency department. They or their family verbally stated understanding and agreement with plan and discharged in stable condition.   Vital signs are stable at discharge. Filed Vitals:   05/17/15 2308  BP: 161/66  Pulse: 95  Temp:   Resp: [address] PA-C 05/17/15 2310  Pattricia Boss, MD 05/21/15 [phone]

## 2015-05-17 NOTE — ED Notes (Signed)
VS at 2205 taken when Pt was asleep.  Pt denies CPAP use of sleep apnea Dx.  Pt O2 sat 93-96% on RA when awake.

## 2015-05-17 NOTE — ED Notes (Addendum)
Pt ambulated with writer, steady gait with a walker.  Writer also provided pt with a cup of sprite zero for PO Challenge

## 2015-05-17 NOTE — Discharge Instructions (Signed)
Upper Respiratory Infection, Adult Most upper respiratory infections (URIs) are a viral infection of the air passages leading to the lungs. A URI affects the nose, throat, and upper air passages. The most common type of URI is nasopharyngitis and is typically referred to as "the common cold." URIs run their course and usually go away on their own. Most of the time, a URI does not require medical attention, but sometimes a bacterial infection in the upper airways can follow a viral infection. This is called a secondary infection. Sinus and middle ear infections are common types of secondary upper respiratory infections. Bacterial pneumonia can also complicate a URI. A URI can worsen asthma and chronic obstructive pulmonary disease (COPD). Sometimes, these complications can require emergency medical care and may be life threatening.  CAUSES Almost all URIs are caused by viruses. A virus is a type of germ and can spread from one person to another.  RISKS FACTORS You may be at risk for a URI if:   You smoke.   You have chronic heart or lung disease.  You have a weakened defense (immune) system.   You are very young or very old.   You have nasal allergies or asthma.  You work in crowded or poorly ventilated areas.  You work in health care facilities or schools. SIGNS AND SYMPTOMS  Symptoms typically develop 2-3 days after you come in contact with a cold virus. Most viral URIs last 7-10 days. However, viral URIs from the influenza virus (flu virus) can last 14-18 days and are typically more severe. Symptoms may include:   Runny or stuffy (congested) nose.   Sneezing.   Cough.   Sore throat.   Headache.   Fatigue.   Fever.   Loss of appetite.   Pain in your forehead, behind your eyes, and over your cheekbones (sinus pain).  Muscle aches.  DIAGNOSIS  Your health care provider may diagnose a URI by:  Physical exam.  Tests to check that your symptoms are not due to  another condition such as:  Strep throat.  Sinusitis.  Pneumonia.  Asthma. TREATMENT  A URI goes away on its own with time. It cannot be cured with medicines, but medicines may be prescribed or recommended to relieve symptoms. Medicines may help:  Reduce your fever.  Reduce your cough.  Relieve nasal congestion. HOME CARE INSTRUCTIONS   Take medicines only as directed by your health care provider.   Gargle warm saltwater or take cough drops to comfort your throat as directed by your health care provider.  Use a warm mist humidifier or inhale steam from a shower to increase air moisture. This may make it easier to breathe.  Drink enough fluid to keep your urine clear or pale yellow.   Eat soups and other clear broths and maintain good nutrition.   Rest as needed.   Return to work when your temperature has returned to normal or as your health care provider advises. You may need to stay home longer to avoid infecting others. You can also use a face mask and careful hand washing to prevent spread of the virus.  Increase the usage of your inhaler if you have asthma.   Do not use any tobacco products, including cigarettes, chewing tobacco, or electronic cigarettes. If you need help quitting, ask your health care provider. PREVENTION  The best way to protect yourself from getting a cold is to practice good hygiene.   Avoid oral or hand contact with people with cold  symptoms.   Wash your hands often if contact occurs.  There is no clear evidence that vitamin C, vitamin E, echinacea, or exercise reduces the chance of developing a cold. However, it is always recommended to get plenty of rest, exercise, and practice good nutrition.  SEEK MEDICAL CARE IF:   You are getting worse rather than better.   Your symptoms are not controlled by medicine.   You have chills.  You have worsening shortness of breath.  You have brown or red mucus.  You have yellow or brown nasal  discharge.  You have pain in your face, especially when you bend forward.  You have a fever.  You have swollen neck glands.  You have pain while swallowing.  You have white areas in the back of your throat. SEEK IMMEDIATE MEDICAL CARE IF:   You have severe or persistent:  Headache.  Ear pain.  Sinus pain.  Chest pain.  You have chronic lung disease and any of the following:  Wheezing.  Prolonged cough.  Coughing up blood.  A change in your usual mucus.  You have a stiff neck.  You have changes in your:  Vision.  Hearing.  Thinking.  Mood. MAKE SURE YOU:   Understand these instructions.  Will watch your condition.  Will get help right away if you are not doing well or get worse.   This information is not intended to replace advice given to you by your health care provider. Make sure you discuss any questions you have with your health care provider.   Document Released: 12/28/2000 Document Revised: 11/18/2014 Document Reviewed: 10/09/2013 Elsevier Interactive Patient Education 2016 [place].  Hyperglycemia Hyperglycemia occurs when the glucose (sugar) in your blood is too high. Hyperglycemia can happen for many reasons, but it most often happens to people who do not know they have diabetes or are not managing their diabetes properly.  CAUSES  Whether you have diabetes or not, there are other causes of hyperglycemia. Hyperglycemia can occur when you have diabetes, but it can also occur in other situations that you might not be as aware of, such as: Diabetes  If you have diabetes and are having problems controlling your blood glucose, hyperglycemia could occur because of some of the following reasons:  Not following your meal plan.  Not taking your diabetes medications or not taking it properly.  Exercising less or doing less activity than you normally do.  Being sick. Pre-diabetes  This cannot be ignored. Before people develop Type 2  diabetes, they almost always have "pre-diabetes." This is when your blood glucose levels are higher than normal, but not yet high enough to be diagnosed as diabetes. Research has shown that some long-term damage to the body, especially the heart and circulatory system, may already be occurring during pre-diabetes. If you take action to manage your blood glucose when you have pre-diabetes, you may delay or prevent Type 2 diabetes from developing. Stress  If you have diabetes, you may be "diet" controlled or on oral medications or insulin to control your diabetes. However, you may find that your blood glucose is higher than usual in the hospital whether you have diabetes or not. This is often referred to as "stress hyperglycemia." Stress can elevate your blood glucose. This happens because of hormones put out by the body during times of stress. If stress has been the cause of your high blood glucose, it can be followed regularly by your caregiver. That way he/she can make sure your  hyperglycemia does not continue to get worse or progress to diabetes. Steroids  Steroids are medications that act on the infection fighting system (immune system) to block inflammation or infection. One side effect can be a rise in blood glucose. Most people can produce enough extra insulin to allow for this rise, but for those who cannot, steroids make blood glucose levels go even higher. It is not unusual for steroid treatments to "uncover" diabetes that is developing. It is not always possible to determine if the hyperglycemia will go away after the steroids are stopped. A special blood test called an A1c is sometimes done to determine if your blood glucose was elevated before the steroids were started. SYMPTOMS  Thirsty.  Frequent urination.  Dry mouth.  Blurred vision.  Tired or fatigue.  Weakness.  Sleepy.  Tingling in feet or leg. DIAGNOSIS  Diagnosis is made by monitoring blood glucose in one or all of the  following ways:  A1c test. This is a chemical found in your blood.  Fingerstick blood glucose monitoring.  Laboratory results. TREATMENT  First, knowing the cause of the hyperglycemia is important before the hyperglycemia can be treated. Treatment may include, but is not be limited to:  Education.  Change or adjustment in medications.  Change or adjustment in meal plan.  Treatment for an illness, infection, etc.  More frequent blood glucose monitoring.  Change in exercise plan.  Decreasing or stopping steroids.  Lifestyle changes. HOME CARE INSTRUCTIONS   Test your blood glucose as directed.  Exercise regularly. Your caregiver will give you instructions about exercise. Pre-diabetes or diabetes which comes on with stress is helped by exercising.  Eat wholesome, balanced meals. Eat often and at regular, fixed times. Your caregiver or nutritionist will give you a meal plan to guide your sugar intake.  Being at an ideal weight is important. If needed, losing as little as 10 to 15 pounds may help improve blood glucose levels. SEEK MEDICAL CARE IF:   You have questions about medicine, activity, or diet.  You continue to have symptoms (problems such as increased thirst, urination, or weight gain). SEEK IMMEDIATE MEDICAL CARE IF:   You are vomiting or have diarrhea.  Your breath smells fruity.  You are breathing faster or slower.  You are very sleepy or incoherent.  You have numbness, tingling, or pain in your feet or hands.  You have chest pain.  Your symptoms get worse even though you have been following your caregiver's orders.  If you have any other questions or concerns.   This information is not intended to replace advice given to you by your health care provider. Make sure you discuss any questions you have with your health care provider.   Document Released: 12/28/2000 Document Revised: 09/26/2011 Document Reviewed: 03/10/2015 Elsevier Interactive Patient  Education [employer].

## 2015-05-17 NOTE — ED Notes (Signed)
Pts husband given sandwich, drink and cheese stick.

## 2015-05-17 NOTE — ED Notes (Signed)
Patient c/o sore throat, non productive cough, and dizziness x 1 week. Patient has been taking promethazine-DM from an old prescription with no relief.

## 2015-05-17 NOTE — ED Notes (Signed)
Pt aware of needed urine specimen.

## 2015-05-20 LAB — CULTURE, GROUP A STREP: STREP A CULTURE: NEGATIVE

## 2015-06-04 ENCOUNTER — Other Ambulatory Visit (INDEPENDENT_AMBULATORY_CARE_PROVIDER_SITE_OTHER): Payer: Medicare Other

## 2015-06-04 ENCOUNTER — Ambulatory Visit: Payer: Medicare Other | Admitting: Endocrinology

## 2015-06-04 ENCOUNTER — Encounter: Payer: Self-pay | Admitting: Endocrinology

## 2015-06-04 ENCOUNTER — Other Ambulatory Visit: Payer: Self-pay | Admitting: *Deleted

## 2015-06-04 ENCOUNTER — Ambulatory Visit (INDEPENDENT_AMBULATORY_CARE_PROVIDER_SITE_OTHER): Payer: Medicare Other | Admitting: Endocrinology

## 2015-06-04 VITALS — BP 134/80 | HR 87 | Temp 98.3°F | Resp 16 | Ht 63.0 in | Wt 159.2 lb

## 2015-06-04 DIAGNOSIS — E119 Type 2 diabetes mellitus without complications: Secondary | ICD-10-CM

## 2015-06-04 DIAGNOSIS — I10 Essential (primary) hypertension: Secondary | ICD-10-CM | POA: Diagnosis not present

## 2015-06-04 LAB — BASIC METABOLIC PANEL
BUN: 16 mg/dL (ref 6–23)
CHLORIDE: 105 meq/L (ref 96–112)
CO2: 28 meq/L (ref 19–32)
Calcium: 9.4 mg/dL (ref 8.4–10.5)
Creatinine, Ser: 0.79 mg/dL (ref 0.40–1.20)
GFR: 74.51 mL/min (ref >60.00)
GLUCOSE: 143 mg/dL — AB (ref 70–99)
POTASSIUM: 3.6 meq/L (ref 3.5–5.1)
Sodium: 140 mEq/L (ref 135–145)

## 2015-06-04 LAB — LIPID PANEL
CHOL/HDL RATIO: 3
Cholesterol: 149 mg/dL (ref 0–200)
HDL: 57.5 mg/dL (ref >39.00)
NonHDL: 91.23
Triglycerides: 281 mg/dL — ABNORMAL HIGH (ref 0.0–149.0)
VLDL: 56.2 mg/dL — ABNORMAL HIGH (ref 0.0–40.0)

## 2015-06-04 LAB — LDL CHOLESTEROL, DIRECT: LDL DIRECT: 66 mg/dL

## 2015-06-04 LAB — POCT GLYCOSYLATED HEMOGLOBIN (HGB A1C): HEMOGLOBIN A1C: 7.2

## 2015-06-04 NOTE — Patient Instructions (Signed)
Try checking sugar in between meals  More sugars at bedtime  Metformin 1.2 only at dinner

## 2015-06-04 NOTE — Progress Notes (Signed)
Patient ID: Nancy Glover, female   DOB: [birth year], 81 y.o.   MRN: [id number]           Reason for Appointment: Follow-up for Type 2 Diabetes  Referring physician:Candace Tamala Julian  History of Present Illness:          Diagnosis: Type 2 diabetes mellitus, date of diagnosis: 2004       Past history: patient is unclear about the date and circumstances of her diagnosis. She probably has been on metformin since that time for diagnosis but not clear when her dose was increased to 1 g twice a day No detailed records of her previous level of control are available  Recent history:  The patient is a relatively poor historian and at times has difficulty following instructions She refuses to consider any brand-name medications because of cost; previously had been tried on Januvia with success  Because of her poor control with metformin and glipizide along with significantly high readings after her evening meal she was told to change her glipizide to Prandin before meals  She sometimes will forget to take the Prandin before eating and not clear how consistently she is taking it. Since she is checking her sugars only before breakfast recently difficult to know what her postprandial readings are  Her glucose was 346 in the emergency room and she had a respiratory infection at that time  Her last A1c was 7.6 and it is relatively better at 7.2  Hypoglycemia:   none  Has seen the dietitian and was advised to make some changes including cutting out regular soft drinks.  She is however still drinking some sweetened ice tea.  Also not watching fat intake and had fast food fried chicken sandwich today       Oral hypoglycemic drugs the patient is taking are: metformin 1 g twice a day, Prandin 1 mg tid     Side effects from medications have been:none   Compliance with the medical regimen: fair  Glucose monitoring:  less than once a day         Glucometer: Accucheck    Blood Glucose readings fasting  readings range from 81-93, has about 6 readings only on her meter    Self-care: The diet that the patient has been following is: none Drinks a little Cokes or sweetened tea, usually in the early afternoon and has peanuts as snacks  Meals: 2 meals per day. Breakfast is cereal/oatmeal; dinner 6 pm                Dietician visit, most recent:   10/2014 .               Exercise: unable to do much, has leg pains     Weight history:  Wt Readings from Last 3 Encounters:  06/04/15 159 lb 3.2 oz (72.213 kg)  03/04/15 161 lb 9.6 oz (73.301 kg)  01/01/15 157 lb 9.6 oz (71.487 kg)    Glycemic control: Previous A1c 8.1 done by PCP    Lab Results  Component Value Date   HGBA1C 7.2 06/04/2015   HGBA1C 7.5* 12/29/2014   Lab Results  Component Value Date   LDLCALC 64 03/04/2015   CREATININE 1.33* 05/17/2015         Medication List       This list is accurate as of: 06/04/15  3:13 PM.  Always use your most recent med list.  acetaminophen 500 MG tablet  Commonly known as:  TYLENOL  Take 1,000 mg by mouth every 6 (six) hours as needed (leg pain).     amitriptyline 25 MG tablet  Commonly known as:  ELAVIL  Take 25 mg by mouth daily.     amLODipine 5 MG tablet  Commonly known as:  NORVASC  Take 5 mg by mouth every morning.     aspirin 325 MG tablet  Take 325 mg by mouth every 6 (six) hours as needed for moderate pain or headache.     CALCIUM + D PO  Take 600 mg by mouth daily.     furosemide 20 MG tablet  Commonly known as:  LASIX  Take 20 mg by mouth daily as needed (leg swelling).     glucose blood test strip  Commonly known as:  ACCU-CHEK AVIVA PLUS  Check upto 2x daily     HYDROcodone-acetaminophen 5-325 MG tablet  Commonly known as:  NORCO/VICODIN  Take 1 tablet by mouth every 6 (six) hours as needed for moderate pain.     ibuprofen 200 MG tablet  Commonly known as:  ADVIL,MOTRIN  Take 200-400 mg by mouth every 6 (six) hours as needed for  headache.     losartan 100 MG tablet  Commonly known as:  COZAAR  Take 100 mg by mouth every morning.     metFORMIN 1000 MG tablet  Commonly known as:  GLUCOPHAGE  Take 1,000 mg by mouth 2 (two) times daily with a meal.     omeprazole 20 MG tablet  Commonly known as:  PRILOSEC OTC  Take 20 mg by mouth every morning.     potassium chloride SA 20 MEQ tablet  Commonly known as:  K-DUR,KLOR-CON  Take 1 tablet (20 mEq total) by mouth daily.     predniSONE 1 MG tablet  Commonly known as:  DELTASONE  Take 1 mg by mouth every morning. Take with 5mg  tablet to make 6mg      predniSONE 5 MG tablet  Commonly known as:  DELTASONE  Take 5 mg by mouth every morning. Take with 1mg  tablet to make 6mg      repaglinide 1 MG tablet  Commonly known as:  PRANDIN  before meals: 1 tab before Bfst and lunch and 2 before supper     simvastatin 40 MG tablet  Commonly known as:  ZOCOR  Take 40 mg by mouth every evening.     trimethoprim 100 MG tablet  Commonly known as:  TRIMPEX  Take 100 mg by mouth daily as needed (for irritation (uti) take for abour 3-4 days).     VITAMIN B1-B12 IM  Inject 1,000 mcg/mL into the muscle every 30 (thirty) days.        Allergies:  Allergies  Allergen Reactions  . Actos [Pioglitazone] Hives and Swelling    Body swelling  . Sulfa Antibiotics Swelling    Tongue and mouth swelling    Past Medical History  Diagnosis Date  . Hypertension   . Diabetes mellitus, type 2 ([place])   . Ulcer of right leg ([place])   . Venous insufficiency, peripheral   . Macular degeneration of left eye   . PONV (postoperative nausea and vomiting)   . Hyperlipidemia   . PMR (polymyalgia rheumatica) ([place]) 2003    Past Surgical History  Procedure Laterality Date  . Cervical biopsy  w/ loop electrode excision  01-17-2001  DR LOMAX  . Cysto/ hod/ bladder bx  10-23-2006  DR PETERSON  .  Cataract extraction w/ intraocular lens implant Right   . Esophagogastroduodenoscopy N/A 09/15/2012     Procedure: ESOPHAGOGASTRODUODENOSCOPY (EGD);  Surgeon: Winfield Cunas., MD;  Location: [facility];  Service: Endoscopy;  Laterality: N/A;  . Tonsillectomy    . Eye surgery      rt cataract  . Fracture surgery  1975    lt elbow-fx  . Skin split graft Left 04/19/2013    Procedure: SKIN GRAFT FULL THICKNESS TO LEFT LEG FROM ABDOMEN;  Surgeon: Pedro Earls, MD;  Location: [facility];  Service: General;  Laterality: Left;    Family History  Problem Relation Age of Onset  . Colon cancer Father   . Heart disease Father   . Hypertension Father   . Heart attack Father   . Cancer Mother   . Heart disease Mother   . Diabetes Neg Hx     Social History:  reports that she quit smoking about 23 years ago. Her smoking use included Cigarettes. She has a 40 pack-year smoking history. She has never used smokeless tobacco. She reports that she does not drink alcohol or use illicit drugs.    Review of Systems    Lipid history:  She has been on simvastatin with adequate control       Lab Results  Component Value Date   CHOL 153 03/04/2015   HDL 53.30 03/04/2015   LDLCALC 64 03/04/2015   TRIG 180.0* 03/04/2015   CHOLHDL 3 03/04/2015            Hypertension: present for several years and treated with losartan and amlodipine.  This is being followed by PCP  She had hypokalemia in the hospital. Only rarely will take Lasix  Musculoskeletal:   She has had significant problem with PMR for the last 3-4 years taking low dose prednisone, currently 6 mg.   She thinks her symptoms are controlled Will occasionally take hydrocodone    Last diabetic foot exam done in 10/2014 which was normal   Physical Examination:  BP 134/80 mmHg  Pulse 87  Temp(Src) 98.3 F (36.8 C)  Resp 16  Ht 5\' 3"  (1.6 m)  Wt 159 lb 3.2 oz (72.213 kg)  BMI 28.21 kg/m2  SpO2 96%   ASSESSMENT:  Diabetes type 2 Currently taking 2 g metformin along with Prandin 1 mg before meals A1c is  reasonably good at 7.2 Her fasting readings a low-normal at home but not clear what her readings are after meals Also not clear why her glucose was markedly increased in the ER about 3 weeks ago when she had an acute illness   PLAN:   Reduce evening metformin to half a tablet since fasting readings are relatively lower  Continue to take 1 mg Prandin before meals  More readings in between meals or after supper  Minimize amount of sweet drinks  Follow-up in 3 months again  Recheck potassium today   Patient Instructions  Try checking sugar in between meals  More sugars at bedtime  Metformin 1.2 only at dinner        Nancy Glover 06/04/2015, 3:13 PM   Note: This office note was prepared with Estate agent. Any transcriptional errors that result from this process are unintentional.

## 2015-09-04 ENCOUNTER — Encounter: Payer: Self-pay | Admitting: Endocrinology

## 2015-09-04 ENCOUNTER — Ambulatory Visit (INDEPENDENT_AMBULATORY_CARE_PROVIDER_SITE_OTHER): Payer: Medicare Other | Admitting: Endocrinology

## 2015-09-04 VITALS — BP 128/70 | HR 76 | Temp 98.3°F | Resp 16 | Ht 63.0 in | Wt 155.0 lb

## 2015-09-04 DIAGNOSIS — E119 Type 2 diabetes mellitus without complications: Secondary | ICD-10-CM

## 2015-09-04 DIAGNOSIS — E1165 Type 2 diabetes mellitus with hyperglycemia: Secondary | ICD-10-CM

## 2015-09-04 LAB — MICROALBUMIN / CREATININE URINE RATIO
Creatinine,U: 196.2 mg/dL
MICROALB UR: 2.9 mg/dL — AB (ref 0.0–1.9)
MICROALB/CREAT RATIO: 1.5 mg/g (ref 0.0–30.0)

## 2015-09-04 LAB — GLUCOSE, RANDOM: Glucose, Bld: 106 mg/dL — ABNORMAL HIGH (ref 70–99)

## 2015-09-04 LAB — POCT GLYCOSYLATED HEMOGLOBIN (HGB A1C): HEMOGLOBIN A1C: 7.8

## 2015-09-04 LAB — LDL CHOLESTEROL, DIRECT: Direct LDL: 72 mg/dL

## 2015-09-04 NOTE — Progress Notes (Signed)
Patient ID: Nancy Glover, female   DOB: 24-Jun-1934, 82 y.o.   MRN: [id number]           Reason for Appointment: Follow-up for Type 2 Diabetes  Referring physician:Candace Tamala Julian  History of Present Illness:          Diagnosis: Type 2 diabetes mellitus, date of diagnosis: 2004       Past history: patient is unclear about the date and circumstances of her diagnosis. She probably has been on metformin since that time for diagnosis but not clear when her dose was increased to 1 g twice a day No detailed records of her previous level of control are available  Recent history:  She refuses to consider any brand-name medications because of cost; previously had been tried on Januvia with success  Because of her poor control with metformin and glipizide along with significantly high readings after her evening meal she was told to change her glipizide to Prandin before meals  Current management, blood sugar patterns and problems identified:  Despite repeated instructions she is checking only fasting readings and No readings after meals, she was confused about when to check sugar and will forget to take them  She was told to try taking 2 tablets of Prandin at dinnertime especially with larger meal but she is not remembering to do this except on occasion  She had lost a little weight  A1c has gone up previously 7.2 and now 7.8  This is despite her fasting readings being very normal  Metformin was reduced to 500 mg in the evening because of low-normal fasting readings  Hypoglycemia:   none  Has seen the dietitian and was advised to make some changes including cutting out regular soft drinks.   She is however still drinking some sweetened ice tea.   Also not watching fat intake and had fast food fried chicken sandwich today       Oral hypoglycemic drugs the patient is taking are: metformin 1 g twice a day, Prandin 1 mg tid     Side effects from medications have been:none   Compliance  with the medical regimen: fair  Glucose monitoring:  less than once a day         Glucometer: Accucheck    Blood Glucose readings fasting readings range from 77-101, average 88   Self-care: The diet that the patient has been following is: Variable Drinks occasional sweetened tea, usually in the early afternoon and has peanuts as snacks  Meals: 2 meals per day. Breakfast is cereal/oatmeal or biscuit; dinner 6 pm                Dietician visit, most recent:   10/2014.               Exercise: unable to do much, has leg pains     Weight history:  Wt Readings from Last 3 Encounters:  09/04/15 155 lb (70.308 kg)  06/04/15 159 lb 3.2 oz (72.213 kg)  03/04/15 161 lb 9.6 oz (73.301 kg)    Glycemic control:     Lab Results  Component Value Date   HGBA1C 7.8 09/04/2015   HGBA1C 7.2 06/04/2015   HGBA1C 7.5* 12/29/2014   Lab Results  Component Value Date   MICROALBUR 2.9* 09/04/2015   LDLCALC 64 03/04/2015   CREATININE 0.79 06/04/2015         Medication List       This list is accurate as of: 09/04/15  7:56 PM.  Always use your most recent med list.               acetaminophen 500 MG tablet  Commonly known as:  TYLENOL  Take 1,000 mg by mouth every 6 (six) hours as needed (leg pain).     amitriptyline 25 MG tablet  Commonly known as:  ELAVIL  Take 25 mg by mouth daily.     amLODipine 5 MG tablet  Commonly known as:  NORVASC  Take 5 mg by mouth every morning.     aspirin 325 MG tablet  Take 325 mg by mouth every 6 (six) hours as needed for moderate pain or headache.     CALCIUM + D PO  Take 600 mg by mouth daily.     furosemide 20 MG tablet  Commonly known as:  LASIX  Take 20 mg by mouth daily as needed (leg swelling).     glucose blood test strip  Commonly known as:  ACCU-CHEK AVIVA PLUS  Check upto 2x daily     HYDROcodone-acetaminophen 5-325 MG tablet  Commonly known as:  NORCO/VICODIN  Take 1 tablet by mouth every 6 (six) hours as needed for moderate  pain.     ibuprofen 200 MG tablet  Commonly known as:  ADVIL,MOTRIN  Take 200-400 mg by mouth every 6 (six) hours as needed for headache.     losartan 100 MG tablet  Commonly known as:  COZAAR  Take 100 mg by mouth every morning.     metFORMIN 1000 MG tablet  Commonly known as:  GLUCOPHAGE  Take 1,000 mg by mouth 2 (two) times daily with a meal.     omeprazole 20 MG tablet  Commonly known as:  PRILOSEC OTC  Take 20 mg by mouth every morning.     potassium chloride SA 20 MEQ tablet  Commonly known as:  K-DUR,KLOR-CON  Take 1 tablet (20 mEq total) by mouth daily.     predniSONE 1 MG tablet  Commonly known as:  DELTASONE  Take 1 mg by mouth every morning. Take with 5mg  tablet to make 6mg      predniSONE 5 MG tablet  Commonly known as:  DELTASONE  Take 5 mg by mouth every morning. Take with 1mg  tablet to make 6mg      repaglinide 1 MG tablet  Commonly known as:  PRANDIN  before meals: 1 tab before Bfst and lunch and 2 before supper     simvastatin 40 MG tablet  Commonly known as:  ZOCOR  Take 40 mg by mouth every evening.     trimethoprim 100 MG tablet  Commonly known as:  TRIMPEX  Take 100 mg by mouth daily as needed (for irritation (uti) take for abour 3-4 days).     VITAMIN B1-B12 IM  Inject 1,000 mcg/mL into the muscle every 30 (thirty) days.        Allergies:  Allergies  Allergen Reactions  . Actos [Pioglitazone] Hives and Swelling    Body swelling  . Sulfa Antibiotics Swelling    Tongue and mouth swelling    Past Medical History  Diagnosis Date  . Hypertension   . Diabetes mellitus, type 2 ([place])   . Ulcer of right leg ([place])   . Venous insufficiency, peripheral   . Macular degeneration of left eye   . PONV (postoperative nausea and vomiting)   . Hyperlipidemia   . PMR (polymyalgia rheumatica) ([place]) 2003    Past Surgical History  Procedure Laterality Date  . Cervical biopsy  w/ loop electrode excision  01-17-2001  DR LOMAX  . Cysto/ hod/ bladder  bx  10-23-2006  DR PETERSON  . Cataract extraction w/ intraocular lens implant Right   . Esophagogastroduodenoscopy N/A 09/15/2012    Procedure: ESOPHAGOGASTRODUODENOSCOPY (EGD);  Surgeon: Winfield Cunas., MD;  Location: [facility];  Service: Endoscopy;  Laterality: N/A;  . Tonsillectomy    . Eye surgery      rt cataract  . Fracture surgery  1975    lt elbow-fx  . Skin split graft Left 04/19/2013    Procedure: SKIN GRAFT FULL THICKNESS TO LEFT LEG FROM ABDOMEN;  Surgeon: Pedro Earls, MD;  Location: [facility];  Service: General;  Laterality: Left;    Family History  Problem Relation Age of Onset  . Colon cancer Father   . Heart disease Father   . Hypertension Father   . Heart attack Father   . Cancer Mother   . Heart disease Mother   . Diabetes Neg Hx     Social History:  reports that she quit smoking about 23 years ago. Her smoking use included Cigarettes. She has a 40 pack-year smoking history. She has never used smokeless tobacco. She reports that she does not drink alcohol or use illicit drugs.    Review of Systems    Lipid history:  She has been on simvastatin with adequate control although she thinks she may have been changed to another drug by her PCP recently       Lab Results  Component Value Date   CHOL 149 06/04/2015   HDL 57.50 06/04/2015   LDLCALC 64 03/04/2015   LDLDIRECT 72.0 09/04/2015   TRIG 281.0* 06/04/2015   CHOLHDL 3 06/04/2015            Hypertension: present for several years and treated with losartan and amlodipine.  Appears well-controlled  This is being followed by PCP   Musculoskeletal:   She has had PMR for the several years taking low dose prednisone, still 6 mg.   Will occasionally take hydrocodone    Last diabetic foot exam done in 10/2014 which was normal   Physical Examination:  BP 128/70 mmHg  Pulse 76  Temp(Src) 98.3 F (36.8 C)  Resp 16  Ht 5\' 3"  (1.6 m)  Wt 155 lb (70.308 kg)  BMI 27.46 kg/m2   SpO2 97%   ASSESSMENT:  Diabetes type 2 with BMI 27 See history of present illness for detailed discussion of his current management, blood sugar patterns and problems identified Her A1c has gone up to 7.8 Most likely this is from postprandial hyperglycemia since her fasting readings are averaging only 88 She does not remember to do postprandial readings even though she was explained the need to do this more often She was given a specific time to check her sugar in the evening Currently taking 1.5 g metformin along with Prandin 1 mg before meals   PLAN:   Take 2 tablets of Prandin at suppertime consistently before eating  Continue 1500 mg of metformin  Increase activity as tolerated  More readings after supper  Follow-up in 3 months again   Patient Instructions  Take 2 Prandin at supper daily  Must take some sugars between 8-10 pm  Check blood sugars on waking up 2  times a week Also check blood sugars about 2 hours after a meal and do this after different meals by rotation  Recommended blood sugar levels on waking up is 90-130 and  about 2 hours after meal is 130-160  Please bring your blood sugar monitor to each visit, thank you        [facility] 09/04/2015, 7:56 PM   Note: This office note was prepared with Dragon voice recognition system technology. Any transcriptional errors that result from this process are unintentional.

## 2015-09-04 NOTE — Patient Instructions (Signed)
Take 2 Prandin at supper daily  Must take some sugars between 8-10 pm  Check blood sugars on waking up 2  times a week Also check blood sugars about 2 hours after a meal and do this after different meals by rotation  Recommended blood sugar levels on waking up is 90-130 and about 2 hours after meal is 130-160  Please bring your blood sugar monitor to each visit, thank you

## 2015-11-24 ENCOUNTER — Encounter (HOSPITAL_COMMUNITY)
Admission: EM | Disposition: A | Discharge status: Home Health | Payer: Self-pay | Source: Home / Self Care | Attending: Internal Medicine

## 2015-11-24 ENCOUNTER — Encounter (HOSPITAL_COMMUNITY): Payer: Self-pay

## 2015-11-24 ENCOUNTER — Inpatient Hospital Stay (HOSPITAL_COMMUNITY): Payer: Medicare Other | Admitting: Registered Nurse

## 2015-11-24 ENCOUNTER — Emergency Department (HOSPITAL_COMMUNITY): Payer: Medicare Other

## 2015-11-24 ENCOUNTER — Inpatient Hospital Stay (HOSPITAL_COMMUNITY): Payer: Medicare Other

## 2015-11-24 ENCOUNTER — Inpatient Hospital Stay (HOSPITAL_COMMUNITY)
Admission: EM | Admit: 2015-11-24 | Discharge: 2015-11-27 | DRG: 872 | Disposition: A | Discharge status: Home Health | Payer: Medicare Other | Attending: Internal Medicine | Admitting: Internal Medicine

## 2015-11-24 DIAGNOSIS — R0602 Shortness of breath: Secondary | ICD-10-CM | POA: Diagnosis present

## 2015-11-24 DIAGNOSIS — Z7952 Long term (current) use of systemic steroids: Secondary | ICD-10-CM | POA: Diagnosis not present

## 2015-11-24 DIAGNOSIS — I10 Essential (primary) hypertension: Secondary | ICD-10-CM | POA: Diagnosis present

## 2015-11-24 DIAGNOSIS — E785 Hyperlipidemia, unspecified: Secondary | ICD-10-CM | POA: Diagnosis present

## 2015-11-24 DIAGNOSIS — Z87442 Personal history of urinary calculi: Secondary | ICD-10-CM | POA: Diagnosis not present

## 2015-11-24 DIAGNOSIS — Z800 Family history of malignant neoplasm of digestive organs: Secondary | ICD-10-CM | POA: Diagnosis not present

## 2015-11-24 DIAGNOSIS — A419 Sepsis, unspecified organism: Secondary | ICD-10-CM | POA: Diagnosis present

## 2015-11-24 DIAGNOSIS — K219 Gastro-esophageal reflux disease without esophagitis: Secondary | ICD-10-CM | POA: Diagnosis present

## 2015-11-24 DIAGNOSIS — R0902 Hypoxemia: Secondary | ICD-10-CM | POA: Diagnosis present

## 2015-11-24 DIAGNOSIS — N200 Calculus of kidney: Secondary | ICD-10-CM

## 2015-11-24 DIAGNOSIS — M353 Polymyalgia rheumatica: Secondary | ICD-10-CM | POA: Diagnosis present

## 2015-11-24 DIAGNOSIS — Z87891 Personal history of nicotine dependence: Secondary | ICD-10-CM

## 2015-11-24 DIAGNOSIS — N202 Calculus of kidney with calculus of ureter: Secondary | ICD-10-CM | POA: Diagnosis present

## 2015-11-24 DIAGNOSIS — E872 Acidosis, unspecified: Secondary | ICD-10-CM | POA: Diagnosis present

## 2015-11-24 DIAGNOSIS — Z888 Allergy status to other drugs, medicaments and biological substances status: Secondary | ICD-10-CM | POA: Diagnosis not present

## 2015-11-24 DIAGNOSIS — I872 Venous insufficiency (chronic) (peripheral): Secondary | ICD-10-CM | POA: Diagnosis present

## 2015-11-24 DIAGNOSIS — Z9841 Cataract extraction status, right eye: Secondary | ICD-10-CM

## 2015-11-24 DIAGNOSIS — N136 Pyonephrosis: Secondary | ICD-10-CM | POA: Diagnosis present

## 2015-11-24 DIAGNOSIS — Z7984 Long term (current) use of oral hypoglycemic drugs: Secondary | ICD-10-CM

## 2015-11-24 DIAGNOSIS — Z79899 Other long term (current) drug therapy: Secondary | ICD-10-CM

## 2015-11-24 DIAGNOSIS — Z7982 Long term (current) use of aspirin: Secondary | ICD-10-CM

## 2015-11-24 DIAGNOSIS — E118 Type 2 diabetes mellitus with unspecified complications: Secondary | ICD-10-CM | POA: Diagnosis not present

## 2015-11-24 DIAGNOSIS — Z961 Presence of intraocular lens: Secondary | ICD-10-CM | POA: Diagnosis present

## 2015-11-24 DIAGNOSIS — R112 Nausea with vomiting, unspecified: Secondary | ICD-10-CM | POA: Diagnosis present

## 2015-11-24 DIAGNOSIS — H3530 Unspecified macular degeneration: Secondary | ICD-10-CM | POA: Diagnosis present

## 2015-11-24 DIAGNOSIS — E860 Dehydration: Secondary | ICD-10-CM | POA: Diagnosis present

## 2015-11-24 DIAGNOSIS — E119 Type 2 diabetes mellitus without complications: Secondary | ICD-10-CM | POA: Diagnosis present

## 2015-11-24 DIAGNOSIS — Z882 Allergy status to sulfonamides status: Secondary | ICD-10-CM

## 2015-11-24 DIAGNOSIS — R109 Unspecified abdominal pain: Secondary | ICD-10-CM | POA: Diagnosis present

## 2015-11-24 DIAGNOSIS — Z8249 Family history of ischemic heart disease and other diseases of the circulatory system: Secondary | ICD-10-CM

## 2015-11-24 HISTORY — PX: CYSTOSCOPY WITH STENT PLACEMENT: SHX5790

## 2015-11-24 LAB — GLUCOSE, CAPILLARY
GLUCOSE-CAPILLARY: 170 mg/dL — AB (ref 65–99)
GLUCOSE-CAPILLARY: 144 mg/dL — AB (ref 65–99)
Glucose-Capillary: 188 mg/dL — ABNORMAL HIGH (ref 65–99)
Glucose-Capillary: 230 mg/dL — ABNORMAL HIGH (ref 65–99)

## 2015-11-24 LAB — CBC WITH DIFFERENTIAL/PLATELET
BASOS PCT: 0 %
Basophils Absolute: 0 10*3/uL (ref 0.0–0.1)
EOS PCT: 1 %
Eosinophils Absolute: 0.2 10*3/uL (ref 0.0–0.7)
HCT: 28.7 % — ABNORMAL LOW (ref 36.0–46.0)
HEMOGLOBIN: 9 g/dL — AB (ref 12.0–15.0)
LYMPHS ABS: 3.9 10*3/uL (ref 0.7–4.0)
Lymphocytes Relative: 22 %
MCH: 21.3 pg — AB (ref 26.0–34.0)
MCHC: 31.4 g/dL (ref 30.0–36.0)
MCV: 67.8 fL — ABNORMAL LOW (ref 78.0–100.0)
MONO ABS: 1.4 10*3/uL — AB (ref 0.1–1.0)
MONOS PCT: 8 %
Neutro Abs: 12.1 10*3/uL — ABNORMAL HIGH (ref 1.7–7.7)
Neutrophils Relative %: 69 %
PLATELETS: 275 10*3/uL (ref 150–400)
RBC: 4.23 MIL/uL (ref 3.87–5.11)
RDW: 20.8 % — ABNORMAL HIGH (ref 11.5–15.5)
WBC: 17.6 10*3/uL — ABNORMAL HIGH (ref 4.0–10.5)

## 2015-11-24 LAB — HEPATIC FUNCTION PANEL
ALT: 12 U/L — AB (ref 14–54)
AST: 13 U/L — AB (ref 15–41)
Albumin: 3.2 g/dL — ABNORMAL LOW (ref 3.5–5.0)
Alkaline Phosphatase: 58 U/L (ref 38–126)
BILIRUBIN DIRECT: 0.2 mg/dL (ref 0.1–0.5)
BILIRUBIN INDIRECT: 0.8 mg/dL (ref 0.3–0.9)
BILIRUBIN TOTAL: 1 mg/dL (ref 0.3–1.2)
Total Protein: 5.7 g/dL — ABNORMAL LOW (ref 6.5–8.1)

## 2015-11-24 LAB — URINE MICROSCOPIC-ADD ON

## 2015-11-24 LAB — I-STAT CG4 LACTIC ACID, ED: Lactic Acid, Venous: 2.08 mmol/L (ref 0.5–2.0)

## 2015-11-24 LAB — PROCALCITONIN: PROCALCITONIN: 11.59 ng/mL

## 2015-11-24 LAB — BASIC METABOLIC PANEL
Anion gap: 10 (ref 5–15)
BUN: 22 mg/dL — AB (ref 6–20)
CHLORIDE: 107 mmol/L (ref 101–111)
CO2: 24 mmol/L (ref 22–32)
CREATININE: 0.9 mg/dL (ref 0.44–1.00)
Calcium: 9.1 mg/dL (ref 8.9–10.3)
GFR calc Af Amer: >60 mL/min (ref >60)
GFR calc non Af Amer: 59 mL/min — ABNORMAL LOW (ref >60)
GLUCOSE: 133 mg/dL — AB (ref 65–99)
POTASSIUM: 3.9 mmol/L (ref 3.5–5.1)
Sodium: 141 mmol/L (ref 135–145)

## 2015-11-24 LAB — LACTIC ACID, PLASMA
Lactic Acid, Venous: 1.6 mmol/L (ref 0.5–2.0)
Lactic Acid, Venous: 1.1 mmol/L (ref 0.5–2.0)

## 2015-11-24 LAB — URINALYSIS, ROUTINE W REFLEX MICROSCOPIC
Bilirubin Urine: NEGATIVE
Glucose, UA: NEGATIVE mg/dL
Ketones, ur: NEGATIVE mg/dL
NITRITE: NEGATIVE
PROTEIN: 30 mg/dL — AB
SPECIFIC GRAVITY, URINE: 1.014 (ref 1.005–1.030)
pH: 6 (ref 5.0–8.0)

## 2015-11-24 LAB — SURGICAL PCR SCREEN
MRSA, PCR: POSITIVE — AB
Staphylococcus aureus: POSITIVE — AB

## 2015-11-24 LAB — PROTIME-INR
INR: 1.15 (ref 0.00–1.49)
Prothrombin Time: 14.9 seconds (ref 11.6–15.2)

## 2015-11-24 LAB — APTT: aPTT: 32 seconds (ref 24–37)

## 2015-11-24 LAB — PHOSPHORUS: PHOSPHORUS: 3.5 mg/dL (ref 2.5–4.6)

## 2015-11-24 LAB — MAGNESIUM: Magnesium: 1.3 mg/dL — ABNORMAL LOW (ref 1.7–2.4)

## 2015-11-24 SURGERY — CYSTOSCOPY, WITH STENT INSERTION
Anesthesia: General | Site: Ureter | Laterality: Left

## 2015-11-24 MED ORDER — INSULIN ASPART 100 UNIT/ML ~~LOC~~ SOLN
0.0000 [IU] | Freq: Every day | SUBCUTANEOUS | Status: DC
  Administered 2015-11-25 – 2015-11-26 (×3): 2 [IU] via SUBCUTANEOUS

## 2015-11-24 MED ORDER — LIDOCAINE HCL (CARDIAC) 20 MG/ML IV SOLN
INTRAVENOUS | Status: AC
  Filled 2015-11-24: qty 5

## 2015-11-24 MED ORDER — ONDANSETRON HCL 4 MG/2ML IJ SOLN: 4.0000 mg | Freq: Once | INTRAMUSCULAR | Status: DC | PRN

## 2015-11-24 MED ORDER — FENTANYL CITRATE (PF) 100 MCG/2ML IJ SOLN
INTRAMUSCULAR | Status: DC | PRN
  Administered 2015-11-24 (×2): 50 ug via INTRAVENOUS

## 2015-11-24 MED ORDER — ATORVASTATIN CALCIUM 10 MG PO TABS
20.0000 mg | ORAL_TABLET | Freq: Every day | ORAL | Status: DC
  Administered 2015-11-25 – 2015-11-27 (×3): 20 mg via ORAL
  Filled 2015-11-24 (×3): qty 2

## 2015-11-24 MED ORDER — ACETAMINOPHEN 650 MG RE SUPP: 650.0000 mg | Freq: Four times a day (QID) | RECTAL | Status: DC | PRN

## 2015-11-24 MED ORDER — ONDANSETRON HCL 4 MG/2ML IJ SOLN: 4.0000 mg | Freq: Four times a day (QID) | INTRAMUSCULAR | Status: DC | PRN

## 2015-11-24 MED ORDER — LOSARTAN POTASSIUM 50 MG PO TABS
100.0000 mg | ORAL_TABLET | Freq: Every morning | ORAL | Status: DC
  Administered 2015-11-25 – 2015-11-27 (×3): 100 mg via ORAL
  Filled 2015-11-24 (×3): qty 2

## 2015-11-24 MED ORDER — PROPOFOL 10 MG/ML IV BOLUS
INTRAVENOUS | Status: DC | PRN
  Administered 2015-11-24: 150 mg via INTRAVENOUS

## 2015-11-24 MED ORDER — BELLADONNA ALKALOIDS-OPIUM 16.2-60 MG RE SUPP
RECTAL | Status: AC
  Filled 2015-11-24: qty 1

## 2015-11-24 MED ORDER — FENTANYL CITRATE (PF) 100 MCG/2ML IJ SOLN
50.0000 ug | Freq: Once | INTRAMUSCULAR | Status: AC
Start: 2015-11-24 — End: 2015-11-24
  Administered 2015-11-24: 50 ug via INTRAVENOUS
  Filled 2015-11-24: qty 2

## 2015-11-24 MED ORDER — AMLODIPINE BESYLATE 5 MG PO TABS
5.0000 mg | ORAL_TABLET | Freq: Every morning | ORAL | Status: DC
  Administered 2015-11-25 – 2015-11-27 (×3): 5 mg via ORAL
  Filled 2015-11-24 (×3): qty 1

## 2015-11-24 MED ORDER — ONDANSETRON HCL 4 MG/2ML IJ SOLN
4.0000 mg | Freq: Once | INTRAMUSCULAR | Status: AC
  Administered 2015-11-24: 4 mg via INTRAVENOUS
  Filled 2015-11-24: qty 2

## 2015-11-24 MED ORDER — ONDANSETRON HCL 4 MG PO TABS: 4.0000 mg | ORAL_TABLET | Freq: Four times a day (QID) | ORAL | Status: DC | PRN

## 2015-11-24 MED ORDER — DIATRIZOATE MEGLUMINE 30 % UR SOLN
URETHRAL | Status: DC | PRN
  Administered 2015-11-24: 8 mL via URETHRAL

## 2015-11-24 MED ORDER — SODIUM CHLORIDE 0.9 % IV SOLN
INTRAVENOUS | Status: DC
  Administered 2015-11-24 – 2015-11-26 (×4): via INTRAVENOUS

## 2015-11-24 MED ORDER — ASPIRIN EC 81 MG PO TBEC
81.0000 mg | DELAYED_RELEASE_TABLET | Freq: Every day | ORAL | Status: DC
  Administered 2015-11-25 – 2015-11-27 (×3): 81 mg via ORAL
  Filled 2015-11-24 (×3): qty 1

## 2015-11-24 MED ORDER — MORPHINE SULFATE (PF) 2 MG/ML IV SOLN: 2.0000 mg | INTRAVENOUS | Status: DC | PRN

## 2015-11-24 MED ORDER — ACETAMINOPHEN 325 MG PO TABS: 650.0000 mg | ORAL_TABLET | Freq: Four times a day (QID) | ORAL | Status: DC | PRN

## 2015-11-24 MED ORDER — DEXTROSE 5 % IV SOLN: 1.0000 g | INTRAVENOUS | Status: DC

## 2015-11-24 MED ORDER — PREDNISONE 5 MG PO TABS
6.0000 mg | ORAL_TABLET | Freq: Every morning | ORAL | Status: DC
  Administered 2015-11-25 – 2015-11-27 (×3): 6 mg via ORAL
  Filled 2015-11-24 (×4): qty 1

## 2015-11-24 MED ORDER — FENTANYL CITRATE (PF) 100 MCG/2ML IJ SOLN
INTRAMUSCULAR | Status: AC
  Filled 2015-11-24: qty 2

## 2015-11-24 MED ORDER — ONDANSETRON HCL 4 MG/2ML IJ SOLN: 4.0000 mg | Freq: Three times a day (TID) | INTRAMUSCULAR | Status: DC | PRN

## 2015-11-24 MED ORDER — HEPARIN SODIUM (PORCINE) 5000 UNIT/ML IJ SOLN
5000.0000 [IU] | Freq: Three times a day (TID) | INTRAMUSCULAR | Status: DC
  Administered 2015-11-24 – 2015-11-27 (×8): 5000 [IU] via SUBCUTANEOUS
  Filled 2015-11-24 (×8): qty 1

## 2015-11-24 MED ORDER — OMEPRAZOLE MAGNESIUM 20 MG PO TBEC: 20.0000 mg | DELAYED_RELEASE_TABLET | Freq: Every morning | ORAL | Status: DC

## 2015-11-24 MED ORDER — HYDROCORTISONE NA SUCCINATE PF 100 MG IJ SOLR
INTRAMUSCULAR | Status: DC | PRN
  Administered 2015-11-24: 100 mg via INTRAVENOUS

## 2015-11-24 MED ORDER — PIPERACILLIN-TAZOBACTAM 3.375 G IVPB
3.3750 g | Freq: Three times a day (TID) | INTRAVENOUS | Status: DC
  Administered 2015-11-24 – 2015-11-27 (×8): 3.375 g via INTRAVENOUS
  Filled 2015-11-24 (×8): qty 50

## 2015-11-24 MED ORDER — LACTATED RINGERS IV SOLN
INTRAVENOUS | Status: DC | PRN
  Administered 2015-11-24: 17:00:00 via INTRAVENOUS

## 2015-11-24 MED ORDER — OXYCODONE HCL 5 MG PO TABS: 5.0000 mg | ORAL_TABLET | Freq: Four times a day (QID) | ORAL | Status: DC | PRN

## 2015-11-24 MED ORDER — DIATRIZOATE MEGLUMINE 30 % UR SOLN
URETHRAL | Status: AC
  Filled 2015-11-24: qty 100

## 2015-11-24 MED ORDER — ONDANSETRON HCL 4 MG/2ML IJ SOLN
INTRAMUSCULAR | Status: AC
  Filled 2015-11-24: qty 2

## 2015-11-24 MED ORDER — LIDOCAINE HCL (CARDIAC) 10 MG/ML IV SOLN
INTRAVENOUS | Status: DC | PRN
  Administered 2015-11-24: 75 mg via INTRAVENOUS

## 2015-11-24 MED ORDER — PROPOFOL 10 MG/ML IV BOLUS
INTRAVENOUS | Status: AC
  Filled 2015-11-24: qty 20

## 2015-11-24 MED ORDER — HYDROCORTISONE NA SUCCINATE PF 100 MG IJ SOLR
INTRAMUSCULAR | Status: AC
Start: 2015-11-24 — End: 2015-11-24
  Filled 2015-11-24: qty 2

## 2015-11-24 MED ORDER — OMEPRAZOLE 20 MG PO CPDR
20.0000 mg | DELAYED_RELEASE_CAPSULE | Freq: Every day | ORAL | Status: DC
  Administered 2015-11-25 – 2015-11-27 (×3): 20 mg via ORAL
  Filled 2015-11-24 (×4): qty 1

## 2015-11-24 MED ORDER — HYDROMORPHONE HCL 1 MG/ML IJ SOLN: 0.5000 mg | INTRAMUSCULAR | Status: DC | PRN

## 2015-11-24 MED ORDER — INSULIN ASPART 100 UNIT/ML ~~LOC~~ SOLN
0.0000 [IU] | Freq: Three times a day (TID) | SUBCUTANEOUS | Status: DC
  Administered 2015-11-25: 5 [IU] via SUBCUTANEOUS
  Administered 2015-11-26 (×2): 3 [IU] via SUBCUTANEOUS
  Administered 2015-11-27: 5 [IU] via SUBCUTANEOUS

## 2015-11-24 MED ORDER — HYDROMORPHONE HCL 1 MG/ML IJ SOLN: 1.0000 mg | INTRAMUSCULAR | Status: DC | PRN

## 2015-11-24 MED ORDER — AMITRIPTYLINE HCL 25 MG PO TABS
25.0000 mg | ORAL_TABLET | Freq: Every day | ORAL | Status: DC
  Administered 2015-11-25 – 2015-11-27 (×3): 25 mg via ORAL
  Filled 2015-11-24 (×3): qty 1

## 2015-11-24 MED ORDER — SODIUM CHLORIDE 0.9 % IV SOLN: INTRAVENOUS | Status: DC

## 2015-11-24 MED ORDER — BELLADONNA ALKALOIDS-OPIUM 16.2-60 MG RE SUPP
RECTAL | Status: DC | PRN
  Administered 2015-11-24: 1 via RECTAL

## 2015-11-24 MED ORDER — SODIUM CHLORIDE 0.9 % IR SOLN
Status: DC | PRN
Start: 2015-11-24 — End: 2015-11-24
  Administered 2015-11-24: 3000 mL

## 2015-11-24 MED ORDER — DEXTROSE 5 % IV SOLN
2.0000 g | Freq: Once | INTRAVENOUS | Status: AC
  Administered 2015-11-24: 2 g via INTRAVENOUS
  Filled 2015-11-24: qty 2

## 2015-11-24 MED ORDER — ONDANSETRON HCL 4 MG/2ML IJ SOLN
INTRAMUSCULAR | Status: DC | PRN
  Administered 2015-11-24: 4 mg via INTRAVENOUS

## 2015-11-24 MED ORDER — SODIUM CHLORIDE 0.9 % IV BOLUS (SEPSIS)
1000.0000 mL | Freq: Once | INTRAVENOUS | Status: AC
  Administered 2015-11-24: 1000 mL via INTRAVENOUS

## 2015-11-24 SURGICAL SUPPLY — 12 items
BAG URO CATCHER STRL LF (MISCELLANEOUS) ×3 IMPLANT
CATH FOLEY 2WAY SLVR 30CC 16FR (CATHETERS) ×2 IMPLANT
CATH INTERMIT  6FR 70CM (CATHETERS) ×3 IMPLANT
CLOTH BEACON ORANGE TIMEOUT ST (SAFETY) ×3 IMPLANT
GLOVE BIOGEL M STRL SZ7.5 (GLOVE) ×3 IMPLANT
GOWN STRL REUS W/TWL LRG LVL3 (GOWN DISPOSABLE) ×6 IMPLANT
GUIDEWIRE STR DUAL SENSOR (WIRE) ×3 IMPLANT
MANIFOLD NEPTUNE II (INSTRUMENTS) ×3 IMPLANT
PACK CYSTO (CUSTOM PROCEDURE TRAY) ×3 IMPLANT
STENT URET 6FRX26 CONTOUR (STENTS) ×2 IMPLANT
TUBING CONNECTING 10 (TUBING) ×1 IMPLANT
TUBING CONNECTING 10' (TUBING) ×1

## 2015-11-24 NOTE — Op Note (Signed)
Preoperative diagnosis:  1. Left obstructing stone 2. urosepsis   Postoperative diagnosis:  1. same   Procedure:  1. Cystoscopy 2. left ureteral stent placement 3. left retrograde pyelography with interpretation   Surgeon: Ardis Hughs, MD  Anesthesia: General  Complications: None  Intraoperative findings: filling defect noted in proximal ureter with normal caliber ureter distally.  10 cmx6F JJ stent placed in the left ureter.  EBL: Minimal  Specimens: None  Indication: Nancy Glover is a 82 y.o. patient with left obstructing stone and hemodynamic instability. After reviewing the management options for treatment, he elected to proceed with the above surgical procedure(s). We have discussed the potential benefits and risks of the procedure, side effects of the proposed treatment, the likelihood of the patient achieving the goals of the procedure, and any potential problems that might occur during the procedure or recuperation. Informed consent has been obtained.  Description of procedure:  The patient was taken to the operating room and general anesthesia was induced.  The patient was placed in the dorsal lithotomy position, prepped and draped in the usual sterile fashion, and preoperative antibiotics were administered. A preoperative time-out was performed.   Cystourethroscopy was performed.  The patient's urethra was examined and was normal. The bladder was then systematically examined in its entirety. There was no evidence for any bladder tumors, stones, or other mucosal pathology.    Attention then turned to the leftureteral orifice and a ureteral catheter was used to intubate the ureteral orifice.  Omnipaque contrast was injected through the ureteral catheter and a retrograde pyelogram was performed with findings as dictated above.  A 0.38 sensor guidewire was then advanced up the left ureter into the renal pelvis under fluoroscopic guidance.  The wire was then  backloaded through the cystoscope and a ureteral stent was advance over the wire using Seldinger technique.  The stent was positioned appropriately under fluoroscopic and cystoscopic guidance.  The wire was then removed with an adequate stent curl noted in the renal pelvis as well as in the bladder.  The bladder was then emptied and the procedure ended.  The patient appeared to tolerate the procedure well and without complications.  The patient was able to be awakened and transferred to the recovery unit in satisfactory condition.    Ardis Hughs, M.D.

## 2015-11-24 NOTE — Progress Notes (Signed)
Pharmacy Antibiotic Note  Nancy Glover is a 82 y.o. female admitted on 11/24/2015 with UTI.  Pharmacy has been consulted for ceftriaxone dosing.  Dosage remains stable at ordered dose of 1 gr IV q24h and need for further dosage adjustment appears unlikely at present.    Will sign off at this time.  Please reconsult if a change in clinical status warrants re-evaluation of dosage.     Plan: Ceftriaxone 2 gr IV x1 in ED, then ceftriaxone 1 gr IV q24h  Height: 5\' 3"  (160 cm) Weight: 150 lb (68.04 kg) IBW/kg (Calculated) : 52.4  Temp (24hrs), Avg:100.4 F (38 C), Min:98.2 F (36.8 C), Max:102.6 F (39.2 C)   Recent Labs Lab 11/24/15 0603  WBC 17.6*  CREATININE 0.90    Estimated Creatinine Clearance: 46.1 mL/min (by C-G formula based on Cr of 0.9).    Allergies  Allergen Reactions  . Actos [Pioglitazone] Hives and Swelling    Body swelling  . Sulfa Antibiotics Swelling    Tongue and mouth swelling    Antimicrobials this admission: 5/9 ceftriaxone >>    Dose adjustments this admission: -----  Microbiology results: 5/9 UCx:      Thank you for allowing pharmacy to be a part of this patient's care.   Royetta Asal, PharmD, BCPS Pager [phone] 11/24/2015 11:07 AM

## 2015-11-24 NOTE — ED Notes (Signed)
PT request to use bathroom on bedpan. State she does not feel she be able to get up and walk at this time

## 2015-11-24 NOTE — Transfer of Care (Signed)
Immediate Anesthesia Transfer of Care Note  Patient: Nancy Glover  Procedure(s) Performed: Procedure(s): CYSTOSCOPY WITH STENT PLACEMENT left ureter left retrograde pylegram (Left)  Patient Location: PACU  Anesthesia Type:General  Level of Consciousness: awake, alert , oriented and patient cooperative  Airway & Oxygen Therapy: Patient Spontanous Breathing and Patient connected to face mask oxygen  Post-op Assessment: Report given to RN, Post -op Vital signs reviewed and stable and Patient moving all extremities X 4  Post vital signs: stable  Last Vitals:  Filed Vitals:   11/24/15 1238 11/24/15 1715  BP: 142/61   Pulse: 98   Temp: 38.1 C 37.2 C  Resp: 16     Last Pain:  Filed Vitals:   11/24/15 1724  PainSc: Asleep         Complications: No apparent anesthesia complications

## 2015-11-24 NOTE — ED Notes (Signed)
PT CAN GO TO FLOOR AT 1142.

## 2015-11-24 NOTE — H&P (Signed)
History and Physical    AGUSTA HACKENBERG EHO:[id number] DOB: 24-Jan-1934 DOA: 11/24/2015  Referring Provider: Dr. Audie Pinto  PCP: [name], MD   Patient coming from: Home  Chief Complaint: Left flank pain, fever, nausea/vomiting  HPI: Nancy Glover is a 82 y.o. female with PMH significant for hypertension, type 2 diabetes, hyperlipidemia and PMR (on chronic steroids); who presented to the ED complaining of left flank pain with associated nausea and vomiting. Her symptoms has been present for the last 12 hours or so and no subsiding. Patient also has elevated temperature today (MAXIMUM TEMPERATURE 102.6). Her episode of nausea/vomiting occur while she was in the emergency department being evaluated for her flank pain. The pain was 8-9/10 in intensity, stabbing in quality and constant; she endorses no radiation, no alleviating factors. Reports that is worse with movement.  Patient denies chest pain, hematochezia, melena, headaches, palpitations or any other acute complaints.   ED Course: A CT scan was done demonstrating obstructing 10 mm left UPJ; patient blood cultures and urine cultures were taken; IV fluids initiated and a dose of Rocephin given. Urology was consulted and [facility] hospital[facility] has been called to admit the patient for further evaluation and treatment. Patient met criteria for sepsis on presentation with tachycardia, temperature to 102.6, WBCs of 17,000; elevated lactic acid, source of infection her urine and also shortness of breath with hypoxia (even last one appears to be associated with episode of pneumonitis after copious vomiting episodes)  Review of Systems:  All other systems reviewed and apart from HPI, are negative.  Past Medical History  Diagnosis Date  . Hypertension   . Diabetes mellitus, type 2 ([place])   . Ulcer of right leg ([place])   . Venous insufficiency, peripheral   . Macular degeneration of left eye   . PONV (postoperative nausea and vomiting)   .  Hyperlipidemia   . PMR (polymyalgia rheumatica) ([place]) 2003    Past Surgical History  Procedure Laterality Date  . Cervical biopsy  w/ loop electrode excision  01-17-2001  DR LOMAX  . Cysto/ hod/ bladder bx  10-23-2006  DR PETERSON  . Cataract extraction w/ intraocular lens implant Right   . Esophagogastroduodenoscopy N/A 09/15/2012    Procedure: ESOPHAGOGASTRODUODENOSCOPY (EGD);  Surgeon: Winfield Cunas., MD;  Location: [facility];  Service: Endoscopy;  Laterality: N/A;  . Tonsillectomy    . Eye surgery      rt cataract  . Fracture surgery  1975    lt elbow-fx  . Skin split graft Left 04/19/2013    Procedure: SKIN GRAFT FULL THICKNESS TO LEFT LEG FROM ABDOMEN;  Surgeon: Pedro Earls, MD;  Location: [place];  Service: General;  Laterality: Left;     reports that she quit smoking about 24 years ago. Her smoking use included Cigarettes. She has a 40 pack-year smoking history. She has never used smokeless tobacco. She reports that she does not drink alcohol or use illicit drugs.  Allergies  Allergen Reactions  . Actos [Pioglitazone] Hives and Swelling    Body swelling  . Sulfa Antibiotics Swelling    Tongue and mouth swelling    Family History  Problem Relation Age of Onset  . Colon cancer Father   . Heart disease Father   . Hypertension Father   . Heart attack Father   . Cancer Mother   . Heart disease Mother   . Diabetes Neg Hx      Prior to Admission medications   Medication  Sig Start Date End Date Taking? Authorizing Provider  acetaminophen (TYLENOL) 500 MG tablet Take 1,000 mg by mouth every 6 (six) hours as needed (leg pain).   Yes Historical Provider, MD  amitriptyline (ELAVIL) 25 MG tablet Take 25 mg by mouth daily.  05/17/13  Yes Historical Provider, MD  amLODipine (NORVASC) 5 MG tablet Take 5 mg by mouth every morning.   Yes Historical Provider, MD  aspirin 325 MG tablet Take 325 mg by mouth every 6 (six) hours as needed for moderate pain  or headache.   Yes Historical Provider, MD  aspirin EC 81 MG tablet Take 81 mg by mouth daily.   Yes Historical Provider, MD  atorvastatin (LIPITOR) 20 MG tablet Take 20 mg by mouth daily.   Yes Historical Provider, MD  Calcium Carbonate-Vitamin D (CALCIUM + D PO) Take 600 mg by mouth daily.   Yes Historical Provider, MD  furosemide (LASIX) 20 MG tablet Take 20 mg by mouth daily as needed (leg swelling).    Yes Historical Provider, MD  glucose blood (ACCU-CHEK AVIVA PLUS) test strip Check upto 2x daily 03/04/15  Yes Elayne Snare, MD  ibuprofen (ADVIL,MOTRIN) 200 MG tablet Take 200-400 mg by mouth every 6 (six) hours as needed for headache.   Yes Historical Provider, MD  losartan (COZAAR) 100 MG tablet Take 100 mg by mouth every morning.   Yes Historical Provider, MD  metFORMIN (GLUCOPHAGE) 1000 MG tablet Take 1,000 mg by mouth 2 (two) times daily with a meal.   Yes Historical Provider, MD  omeprazole (PRILOSEC OTC) 20 MG tablet Take 20 mg by mouth every morning.    Yes Historical Provider, MD  potassium chloride (K-DUR) 10 MEQ tablet Take 10 mEq by mouth daily.   Yes Historical Provider, MD  predniSONE (DELTASONE) 1 MG tablet Take 1 mg by mouth every morning. Take with 60m tablet to make 698m  Yes Historical Provider, MD  predniSONE (DELTASONE) 5 MG tablet Take 5 mg by mouth every morning. Take with 88m13mablet to make 6mg24mYes Historical Provider, MD  repaglinide (PRANDIN) 1 MG tablet before meals: 1 tab before Bfst and lunch and 2 before supper Patient taking differently: Take 1-2 mg by mouth 3 (three) times daily before meals. Take 88mg 15more breakfast and lunch and then take 2mg b7mre supper 12/04/14  Yes Ajay KElayne Snaretrimethoprim (TRIMPEX) 100 MG tablet Take 100 mg by mouth daily as needed (for irritation (uti) take for abour 3-4 days).    Yes Historical Provider, MD  VITAMIN B1-B12 IM Inject 1,000 mcg/mL into the muscle every 30 (thirty) days.   Yes Historical Provider, MD  ciprofloxacin  (CIPRO) 500 MG tablet Take 1 tablet by mouth 2 (two) times daily. Reported on 11/24/2015 11/11/15   Historical Provider, MD    Physical Exam: Filed Vitals:   11/24/15 1011 11/24/15 1100 11/24/15 1156 11/24/15 1238  BP:   144/51 142/61  Pulse:  97 96 98  Temp:   99.8 F (37.7 C) 100.5 F (38.1 C)  TempSrc:   Oral Oral  Resp:   15 16  Height:    '5\' 3"'  (1.6 m)  Weight:    73.3 kg (161 lb 9.6 oz)  SpO2: 91% 96% 96% 98%    Constitutional: NAD, Uncomfortable due to left flank pain; denies chest pain or orthopnea currently. Patient was warm to toucht. Eyes: PERRLA, lids and conjunctivae normal, no icterus, no nystagmus ENMT: Mucous membranes are moist. Posterior pharynx clear of  any exudate or lesions. Normal dentition.  Neck: normal, supple, no masses, no thyromegaly, no JVD Respiratory: clear to auscultation bilaterally, no wheezing, no crackles. Normal respiratory effort. No accessory muscle use. Scattered rhonchi appreciated in the mid aspect of her lung fields. Cardiovascular: S1 & S2 heard, Mild tachycardia, no rubs, no gallops. No extremity edema. 2+ pedal pulses. No carotid bruits.  Abdomen: No distension, Tender to palpation in her left flank, positive CVA. Patient without any guarding, soft abdomen and positive bowel sounds.   Musculoskeletal: no clubbing / cyanosis. No joint deformity upper and lower extremities. Good ROM, no contractures. Normal muscle tone.  Skin: no rashes, lesions, ulcers. No induration Neurologic: CN 2-12 grossly intact. Sensation intact, DTR normal. Strength 5/5 in all 4 limbs.  Psychiatric: Normal judgment and insight. Alert and oriented x 3. Normal mood.   Labs on Admission: I have personally reviewed following labs and imaging studies  CBC:  Recent Labs Lab 11/24/15 0603  WBC 17.6*  NEUTROABS 12.1*  HGB 9.0*  HCT 28.7*  MCV 67.8*  PLT 884   Basic Metabolic Panel:  Recent Labs Lab 11/24/15 0603  NA 141  K 3.9  CL 107  CO2 24  GLUCOSE 133*   BUN 22*  CREATININE 0.90  CALCIUM 9.1   GFR: Estimated Creatinine Clearance: 47.9 mL/min (by C-G formula based on Cr of 0.9).  CBG:  Recent Labs Lab 11/24/15 1236  GLUCAP 170*   Urine analysis:    Component Value Date/Time   COLORURINE YELLOW 11/24/2015 [place] 11/24/2015 0634   LABSPEC 1.014 11/24/2015 0634   PHURINE 6.0 11/24/2015 [place] 11/24/2015 0634   HGBUR TRACE* 11/24/2015 [place] 11/24/2015 0634   [place] 11/24/2015 0634   PROTEINUR 30* 11/24/2015 0634   UROBILINOGEN 1.0 05/17/2015 2053   NITRITE NEGATIVE 11/24/2015 0634   LEUKOCYTESUR TRACE* 11/24/2015 0634   Sepsis Labs: Lactic acid level  2.08  Recent Results (from the past 240 hour(s))  Blood Culture (routine x 2)     Status: None (Preliminary result)   Collection Time: 11/24/15 11:06 AM  Result Value Ref Range Status   Specimen Description BLOOD RIGHT HAND  Final   Special Requests   Final    BOTTLES DRAWN AEROBIC AND ANAEROBIC 5CC Performed at [facility]    Culture PENDING  Incomplete   Report Status PENDING  Incomplete     Radiological Exams on Admission: Dg Chest 2 View  11/24/2015  CLINICAL DATA:  Shortness of breath, weakness, left flank pain, chest pain EXAM: CHEST  2 VIEW COMPARISON:  05/17/2015 FINDINGS: Borderline cardiomegaly. No infiltrate or pleural effusion. No pulmonary edema. Osteopenia and mild degenerative changes thoracic spine. Stable mild compression deformity mid thoracic spine. Atherosclerotic calcifications of thoracic aorta. IMPRESSION: No active cardiopulmonary disease. Osteopenia and mild degenerative changes thoracic spine. Electronically Signed   By: Lahoma Crocker M.D.   On: 11/24/2015 09:58   Ct Renal Stone Study  11/24/2015  CLINICAL DATA:  Left flank pain. EXAM: CT ABDOMEN AND PELVIS WITHOUT CONTRAST TECHNIQUE: Multidetector CT imaging of the abdomen and pelvis was performed following the standard  protocol without IV contrast. COMPARISON:  None. FINDINGS: Lower chest and abdominal wall:  Small hiatal hernia. Hepatobiliary: No focal liver abnormality.Cholelithiasis without signs of cholecystitis. No biliary distention. Pancreas: Unremarkable. Spleen: Unremarkable. Adrenals/Urinary Tract:  Negative adrenals. 6 mm left UPJ calculus with mild hydronephrosis and asymmetric left perinephric edema and renal expansion. Bilateral nephrolithiasis  with largest stone on the right measuring 8 mm in the upper pole and on the left measuring 10 mm in the interpolar region. Negative bladder. Reproductive:No pathologic findings. Stomach/Bowel: No obstruction. No appendicitis. Colonic diverticulosis. Vascular/Lymphatic: Extensive atherosclerosis of the aorta and branch vessels. No mass or adenopathy. Peritoneal: No ascites or pneumoperitoneum. Musculoskeletal: No acute abnormalities. IMPRESSION: 1. Obstructing 6 mm left UPJ calculus. 2. Bilateral nephrolithiasis with calculi measuring up to 10 mm on the left. 3. Cholelithiasis. Electronically Signed   By: Monte Fantasia M.D.   On: 11/24/2015 08:20    EKG:  None  Assessment/Plan 1-Sepsis [facility]): due to UTI and Left UPJ stone  -mild hydronephrosis appreciated on CT scan -urology will help with stent placement and if possible stone extraction -following sepsis protocol will given IVF's resuscitation and maintenance -started on zosyn -will follow culture data (blood and urine) -will follow clinical response -PNR antiemetics, antipyretics and analgesics ordered  2-Hypertension: -stable -Will continue amlodipine and Cozaar  3-Diabetes mellitus ([place]): -While inpatient will hold oral hypoglycemic agents and will use a sliding scale insulin Follow CBG's and adjust as needed -will check A1C  4-Nephrolithiasis: -Urology has been consulted and will follow recommendations.  5-Lactic acidosis: -Secondary to dehydration and sepsis -Will follow sepsis protocol and  provide fluid resuscitation -Trend lactic acid level  6-SOB (shortness of breath): -Without any prior history of COPD -Patient with copious episode of vomiting the morning of admission and with concerns of potential pneumonitis/aspiration -Chest x-ray is currently clear; But the patient is requiring oxygen supplementation to keep her oxygen saturation above 90% -Will continue oxygen supplementation as needed, start the use of flutter valve and she will be covered with Zosyn  7-Nausea with vomiting: -Associated with episode of nephrolithiasis/UTI/sepsis -Will provide fluid resuscitation and as needed antiemetics  8-HLD (hyperlipidemia): -Will continue statins  9-GERD without esophagitis: -Continue PPI   10-PMR: Appears to be a stable -Continue chronic prednisone.  DVT prophylaxis: heparin   Code Status: Full Family Communication: husband at bedside  Disposition Plan: anticipate discharge back home in 1-2 days once medically stable. Consults called: urology service (Dr. Louis Meckel) Admission status: inpatient, LOS > 2 midnights; med-surg bed    Barton Dubois MD [facility] Pager [phone]  If 7PM-7AM, please contact night-coverage [email or web address] Password [email or web address]  11/24/2015, 2:52 PM

## 2015-11-24 NOTE — ED Notes (Signed)
Hospitalist at bedside. Will obtain blood and administer antibiotics when hospitalist finished.

## 2015-11-24 NOTE — Anesthesia Postprocedure Evaluation (Signed)
Anesthesia Post Note  Patient: Nancy Glover  Procedure(s) Performed: Procedure(s) (LRB): CYSTOSCOPY WITH STENT PLACEMENT left ureter left retrograde pylegram (Left)  Patient location during evaluation: PACU Anesthesia Type: General Level of consciousness: awake, awake and alert, oriented and patient cooperative Pain management: pain level controlled Vital Signs Assessment: post-procedure vital signs reviewed and stable Respiratory status: spontaneous breathing and respiratory function stable Cardiovascular status: stable Anesthetic complications: no    Last Vitals:  Filed Vitals:   11/24/15 1238 11/24/15 1715  BP: 142/61 147/57  Pulse: 98 84  Temp: 38.1 C 37.2 C  Resp: 16 14    Last Pain:  Filed Vitals:   11/24/15 1746  PainSc: Asleep                 Danuta Huseman EDWARD

## 2015-11-24 NOTE — Anesthesia Procedure Notes (Signed)
Procedure Name: LMA Insertion Date/Time: 11/24/2015 4:43 PM Performed by: Enrigue Catena E Pre-anesthesia Checklist: Patient identified, Emergency Drugs available, Suction available and Patient being monitored Patient Re-evaluated:Patient Re-evaluated prior to inductionOxygen Delivery Method: Circle System Utilized Preoxygenation: Pre-oxygenation with 100% oxygen Intubation Type: IV induction Ventilation: Mask ventilation without difficulty LMA: LMA with gastric port inserted LMA Size: 4.0 Tube type: Oral (stomach decompressed with gastric tube) Number of attempts: 1 Placement Confirmation: positive ETCO2 Tube secured with: Tape Dental Injury: Teeth and Oropharynx as per pre-operative assessment

## 2015-11-24 NOTE — ED Notes (Signed)
Patient transported to CT 

## 2015-11-24 NOTE — ED Notes (Signed)
Pt complains of flank pain that started at 3am, urinary frequency and severe pain

## 2015-11-24 NOTE — Anesthesia Preprocedure Evaluation (Signed)
Anesthesia Evaluation  Patient identified by MRN, date of birth, ID band Patient awake    Reviewed: Allergy & Precautions, NPO status , Patient's Chart, lab work & pertinent test results, reviewed documented beta blocker date and time   History of Anesthesia Complications (+) PONV  Airway Mallampati: II  TM Distance: >3 FB Neck ROM: Full    Dental   Pulmonary shortness of breath, COPD, former smoker,    Pulmonary exam normal        Cardiovascular hypertension, + Peripheral Vascular Disease  Normal cardiovascular exam     Neuro/Psych    GI/Hepatic GERD  ,  Endo/Other  diabetes, Type 2, Oral Hypoglycemic Agents  Renal/GU Renal disease     Musculoskeletal  (+) Arthritis ,   Abdominal   Peds  Hematology  (+) anemia ,   Anesthesia Other Findings Polymyagia  Reproductive/Obstetrics                             Anesthesia Physical Anesthesia Plan  ASA: III  Anesthesia Plan: General   Post-op Pain Management:    Induction: Intravenous  Airway Management Planned: Oral ETT and LMA  Additional Equipment:   Intra-op Plan:   Post-operative Plan: Extubation in OR  Informed Consent: I have reviewed the patients History and Physical, chart, labs and discussed the procedure including the risks, benefits and alternatives for the proposed anesthesia with the patient or authorized representative who has indicated his/her understanding and acceptance.     Plan Discussed with: CRNA, Anesthesiologist and Surgeon  Anesthesia Plan Comments:         Anesthesia Quick Evaluation

## 2015-11-24 NOTE — Progress Notes (Signed)
Per Order Rapid Response Nurse in ICU given update regarding Pt's Lactic Acid Level 2.08.

## 2015-11-24 NOTE — Progress Notes (Signed)
Pt admitted to 1444 from the ER. Pt noted to have dressing to Right arm around the elbow area. Small skin tear to this area of the right arm when Pt was moved in the ER. Area cleaned in ER and dressing remains clean dry and intact.

## 2015-11-24 NOTE — ED Notes (Signed)
Patient transported to X-ray 

## 2015-11-24 NOTE — Consult Note (Signed)
Urology Consult   Physician requesting consult: Barton Dubois  Reason for consult: Left ureteral stone with hydronephrosis  History of Present Illness: Nancy Glover is a 82 y.o. female with PMH significant for HTN, DM II, PMR on daily prednisone, and hyperlipidemia who presented to the ED for eval of left back/flank pain.  Her pain began around 3am today and has not subsided.  She denies GH, F/C, and dysuria.  She developed N/V after admission to ED.  CT scan reveals 87mm left proximal ureteral stone with mild hydro and perinephric edema as well as multiple bilateral non obstructing renal stones. UA is nitrite negative with rare bacteria and trace Hg and WBCs.  WBC 17.6, Cr 0.9.   She states she had a stone approx 20 years ago but does not recall if she passed it or required intervention.  She has SUI but only wears depends at night.  Nocturia x 3-4.  She has hx of chronic cystitis and uses Trimethoprim prn.  She denies a history of hematuria, STDs, GU malignancy/trauma/surgery.  She is currently resting comfortably and her only complaint is of being thirsty.  She denies F/C, HA, CP, SOB, N/V, abdominal pain and diarrhea/constipation.    She has not had anything to eat or drink since 6:30pm last night.  She is concerned because she has not taken her daily dose of prednisone today.   Past Medical History  Diagnosis Date  . Hypertension   . Diabetes mellitus, type 2 ([place])   . Ulcer of right leg ([place])   . Venous insufficiency, peripheral   . Macular degeneration of left eye   . PONV (postoperative nausea and vomiting)   . Hyperlipidemia   . PMR (polymyalgia rheumatica) ([place]) 2003    Past Surgical History  Procedure Laterality Date  . Cervical biopsy  w/ loop electrode excision  01-17-2001  DR LOMAX  . Cysto/ hod/ bladder bx  10-23-2006  DR PETERSON  . Cataract extraction w/ intraocular lens implant Right   . Esophagogastroduodenoscopy N/A 09/15/2012    Procedure:  ESOPHAGOGASTRODUODENOSCOPY (EGD);  Surgeon: Winfield Cunas., MD;  Location: [facility];  Service: Endoscopy;  Laterality: N/A;  . Tonsillectomy    . Eye surgery      rt cataract  . Fracture surgery  1975    lt elbow-fx  . Skin split graft Left 04/19/2013    Procedure: SKIN GRAFT FULL THICKNESS TO LEFT LEG FROM ABDOMEN;  Surgeon: Pedro Earls, MD;  Location: [facility];  Service: General;  Laterality: Left;     Current Hospital Medications:  Home meds:    Medication List    ASK your doctor about these medications        acetaminophen 500 MG tablet  Commonly known as:  TYLENOL  Take 1,000 mg by mouth every 6 (six) hours as needed (leg pain).     amitriptyline 25 MG tablet  Commonly known as:  ELAVIL  Take 25 mg by mouth daily.     amLODipine 5 MG tablet  Commonly known as:  NORVASC  Take 5 mg by mouth every morning.     aspirin 325 MG tablet  Take 325 mg by mouth every 6 (six) hours as needed for moderate pain or headache.     aspirin EC 81 MG tablet  Take 81 mg by mouth daily.     atorvastatin 20 MG tablet  Commonly known as:  LIPITOR  Take 20 mg by mouth daily.  CALCIUM + D PO  Take 600 mg by mouth daily.     ciprofloxacin 500 MG tablet  Commonly known as:  CIPRO  Take 1 tablet by mouth 2 (two) times daily. Reported on 11/24/2015     furosemide 20 MG tablet  Commonly known as:  LASIX  Take 20 mg by mouth daily as needed (leg swelling).     glucose blood test strip  Commonly known as:  ACCU-CHEK AVIVA PLUS  Check upto 2x daily     ibuprofen 200 MG tablet  Commonly known as:  ADVIL,MOTRIN  Take 200-400 mg by mouth every 6 (six) hours as needed for headache.     losartan 100 MG tablet  Commonly known as:  COZAAR  Take 100 mg by mouth every morning.     metFORMIN 1000 MG tablet  Commonly known as:  GLUCOPHAGE  Take 1,000 mg by mouth 2 (two) times daily with a meal.     omeprazole 20 MG tablet  Commonly known as:  PRILOSEC OTC   Take 20 mg by mouth every morning.     potassium chloride 10 MEQ tablet  Commonly known as:  K-DUR  Take 10 mEq by mouth daily.     potassium chloride SA 20 MEQ tablet  Commonly known as:  K-DUR,KLOR-CON  Take 1 tablet (20 mEq total) by mouth daily.     predniSONE 1 MG tablet  Commonly known as:  DELTASONE  Take 1 mg by mouth every morning. Take with 5mg  tablet to make 6mg      predniSONE 5 MG tablet  Commonly known as:  DELTASONE  Take 5 mg by mouth every morning. Take with 1mg  tablet to make 6mg      repaglinide 1 MG tablet  Commonly known as:  PRANDIN  before meals: 1 tab before Bfst and lunch and 2 before supper     trimethoprim 100 MG tablet  Commonly known as:  TRIMPEX  Take 100 mg by mouth daily as needed (for irritation (uti) take for abour 3-4 days).     VITAMIN B1-B12 IM  Inject 1,000 mcg/mL into the muscle every 30 (thirty) days.        Scheduled Meds:  Continuous Infusions: . sodium chloride    . [START ON 11/25/2015] cefTRIAXone (ROCEPHIN)  IV    . cefTRIAXone (ROCEPHIN)  IV     PRN Meds:.HYDROmorphone (DILAUDID) injection, ondansetron (ZOFRAN) IV  Allergies:  Allergies  Allergen Reactions  . Actos [Pioglitazone] Hives and Swelling    Body swelling  . Sulfa Antibiotics Swelling    Tongue and mouth swelling    Family History  Problem Relation Age of Onset  . Colon cancer Father   . Heart disease Father   . Hypertension Father   . Heart attack Father   . Cancer Mother   . Heart disease Mother   . Diabetes Neg Hx     Social History:  reports that she quit smoking about 24 years ago. Her smoking use included Cigarettes. She has a 40 pack-year smoking history. She has never used smokeless tobacco. She reports that she does not drink alcohol or use illicit drugs.  ROS: A complete review of systems was performed.  All systems are negative except for pertinent findings as noted.  Physical Exam:  Vital signs in last 24 hours: Temp:  [98.2 F (36.8  C)-102.6 F (39.2 C)] 102.6 F (39.2 C) (05/09 0857) Pulse Rate:  [93-103] 97 (05/09 1100) Resp:  [16-18] 16 (05/09 1006) BP: (152-188)/(53-70)  152/60 mmHg (05/09 1006) SpO2:  [72 %-98 %] 96 % (05/09 1100) Weight:  [68.04 kg (150 lb)] 68.04 kg (150 lb) (05/09 0504) Constitutional:  Alert and oriented, No acute distress Cardiovascular: Regular rate and rhythm Respiratory: Normal respiratory effort GI: Abdomen is soft, nontender, nondistended, no abdominal masses GU: No CVA tenderness Lymphatic: No lymphadenopathy Neurologic: Grossly intact, no focal deficits Psychiatric: Normal mood and affect  Laboratory Data:   Recent Labs  11/24/15 0603  WBC 17.6*  HGB 9.0*  HCT 28.7*  PLT 275     Recent Labs  11/24/15 0603  NA 141  K 3.9  CL 107  GLUCOSE 133*  BUN 22*  CALCIUM 9.1  CREATININE 0.90     Results for orders placed or performed during the hospital encounter of 11/24/15 (from the past 24 hour(s))  Basic metabolic panel     Status: Abnormal   Collection Time: 11/24/15  6:03 AM  Result Value Ref Range   Sodium 141 135 - 145 mmol/L   Potassium 3.9 3.5 - 5.1 mmol/L   Chloride 107 101 - 111 mmol/L   CO2 24 22 - 32 mmol/L   Glucose, Bld 133 (H) 65 - 99 mg/dL   BUN 22 (H) 6 - 20 mg/dL   Creatinine, Ser 0.90 0.44 - 1.00 mg/dL   Calcium 9.1 8.9 - 10.3 mg/dL   GFR calc non Af Amer 59 (L) >60 mL/min   GFR calc Af Amer >60 >60 mL/min   Anion gap 10 5 - 15  CBC with Differential     Status: Abnormal   Collection Time: 11/24/15  6:03 AM  Result Value Ref Range   WBC 17.6 (H) 4.0 - 10.5 K/uL   RBC 4.23 3.87 - 5.11 MIL/uL   Hemoglobin 9.0 (L) 12.0 - 15.0 g/dL   HCT 28.7 (L) 36.0 - 46.0 %   MCV 67.8 (L) 78.0 - 100.0 fL   MCH 21.3 (L) 26.0 - 34.0 pg   MCHC 31.4 30.0 - 36.0 g/dL   RDW 20.8 (H) 11.5 - 15.5 %   Platelets 275 150 - 400 K/uL   Neutrophils Relative % 69 %   Lymphocytes Relative 22 %   Monocytes Relative 8 %   Eosinophils Relative 1 %   Basophils  Relative 0 %   Neutro Abs 12.1 (H) 1.7 - 7.7 K/uL   Lymphs Abs 3.9 0.7 - 4.0 K/uL   Monocytes Absolute 1.4 (H) 0.1 - 1.0 K/uL   Eosinophils Absolute 0.2 0.0 - 0.7 K/uL   Basophils Absolute 0.0 0.0 - 0.1 K/uL   RBC Morphology POLYCHROMASIA PRESENT    Smear Review LARGE PLATELETS PRESENT   Urinalysis, Routine w reflex microscopic     Status: Abnormal   Collection Time: 11/24/15  6:34 AM  Result Value Ref Range   Color, Urine YELLOW YELLOW   APPearance CLEAR CLEAR   Specific Gravity, Urine 1.014 1.005 - 1.030   pH 6.0 5.0 - 8.0   Glucose, UA NEGATIVE NEGATIVE mg/dL   Hgb urine dipstick TRACE (A) NEGATIVE   Bilirubin Urine NEGATIVE NEGATIVE   Ketones, ur NEGATIVE NEGATIVE mg/dL   Protein, ur 30 (A) NEGATIVE mg/dL   Nitrite NEGATIVE NEGATIVE   Leukocytes, UA TRACE (A) NEGATIVE  Urine microscopic-add on     Status: Abnormal   Collection Time: 11/24/15  6:34 AM  Result Value Ref Range   Squamous Epithelial / LPF 0-5 (A) NONE SEEN   WBC, UA 6-30 0 - 5 WBC/hpf  RBC / HPF 0-5 0 - 5 RBC/hpf   Bacteria, UA RARE (A) NONE SEEN  I-Stat CG4 Lactic Acid, ED  (not at  [facility])     Status: Abnormal   Collection Time: 11/24/15 11:16 AM  Result Value Ref Range   Lactic Acid, Venous 2.08 (HH) 0.5 - 2.0 mmol/L   Comment NOTIFIED PHYSICIAN    No results found for this or any previous visit (from the past 240 hour(s)).  Renal Function:  Recent Labs  11/24/15 0603  CREATININE 0.90   Estimated Creatinine Clearance: 46.1 mL/min (by C-G formula based on Cr of 0.9).  Radiologic Imaging: Dg Chest 2 View  11/24/2015  CLINICAL DATA:  Shortness of breath, weakness, left flank pain, chest pain EXAM: CHEST  2 VIEW COMPARISON:  05/17/2015 FINDINGS: Borderline cardiomegaly. No infiltrate or pleural effusion. No pulmonary edema. Osteopenia and mild degenerative changes thoracic spine. Stable mild compression deformity mid thoracic spine. Atherosclerotic calcifications of thoracic aorta. IMPRESSION: No active  cardiopulmonary disease. Osteopenia and mild degenerative changes thoracic spine. Electronically Signed   By: Lahoma Crocker M.D.   On: 11/24/2015 09:58   Ct Renal Stone Study  11/24/2015  CLINICAL DATA:  Left flank pain. EXAM: CT ABDOMEN AND PELVIS WITHOUT CONTRAST TECHNIQUE: Multidetector CT imaging of the abdomen and pelvis was performed following the standard protocol without IV contrast. COMPARISON:  None. FINDINGS: Lower chest and abdominal wall:  Small hiatal hernia. Hepatobiliary: No focal liver abnormality.Cholelithiasis without signs of cholecystitis. No biliary distention. Pancreas: Unremarkable. Spleen: Unremarkable. Adrenals/Urinary Tract:  Negative adrenals. 6 mm left UPJ calculus with mild hydronephrosis and asymmetric left perinephric edema and renal expansion. Bilateral nephrolithiasis with largest stone on the right measuring 8 mm in the upper pole and on the left measuring 10 mm in the interpolar region. Negative bladder. Reproductive:No pathologic findings. Stomach/Bowel: No obstruction. No appendicitis. Colonic diverticulosis. Vascular/Lymphatic: Extensive atherosclerosis of the aorta and branch vessels. No mass or adenopathy. Peritoneal: No ascites or pneumoperitoneum. Musculoskeletal: No acute abnormalities. IMPRESSION: 1. Obstructing 6 mm left UPJ calculus. 2. Bilateral nephrolithiasis with calculi measuring up to 10 mm on the left. 3. Cholelithiasis. Electronically Signed   By: Monte Fantasia M.D.   On: 11/24/2015 08:20    Impression/Recommendation:  Left ureteral stone with mild hydro--Dr. Louis Meckel will eval and perform cysto with retrogrades and left ureteral stent placement today.  He will remove stone if feasible. NPO.  Received Rocephin in ED.  Bilateral non obstructing stones can be followed as an outpatient.   F/u urine culture.   [place], [place] 11/24/2015, 11:30 AM

## 2015-11-25 DIAGNOSIS — E118 Type 2 diabetes mellitus with unspecified complications: Secondary | ICD-10-CM

## 2015-11-25 DIAGNOSIS — R0602 Shortness of breath: Secondary | ICD-10-CM

## 2015-11-25 DIAGNOSIS — E785 Hyperlipidemia, unspecified: Secondary | ICD-10-CM

## 2015-11-25 DIAGNOSIS — N200 Calculus of kidney: Secondary | ICD-10-CM

## 2015-11-25 DIAGNOSIS — R112 Nausea with vomiting, unspecified: Secondary | ICD-10-CM

## 2015-11-25 DIAGNOSIS — I10 Essential (primary) hypertension: Secondary | ICD-10-CM

## 2015-11-25 DIAGNOSIS — K219 Gastro-esophageal reflux disease without esophagitis: Secondary | ICD-10-CM

## 2015-11-25 DIAGNOSIS — A419 Sepsis, unspecified organism: Principal | ICD-10-CM

## 2015-11-25 LAB — URINE CULTURE: Special Requests: NORMAL

## 2015-11-25 LAB — CBC
HEMATOCRIT: 24.8 % — AB (ref 36.0–46.0)
HEMOGLOBIN: 7.8 g/dL — AB (ref 12.0–15.0)
MCH: 21.5 pg — ABNORMAL LOW (ref 26.0–34.0)
MCHC: 31.5 g/dL (ref 30.0–36.0)
MCV: 68.5 fL — ABNORMAL LOW (ref 78.0–100.0)
Platelets: 205 10*3/uL (ref 150–400)
RBC: 3.62 MIL/uL — ABNORMAL LOW (ref 3.87–5.11)
RDW: 20.3 % — ABNORMAL HIGH (ref 11.5–15.5)
WBC: 15 10*3/uL — ABNORMAL HIGH (ref 4.0–10.5)

## 2015-11-25 LAB — GLUCOSE, CAPILLARY
GLUCOSE-CAPILLARY: 107 mg/dL — AB (ref 65–99)
GLUCOSE-CAPILLARY: 227 mg/dL — AB (ref 65–99)
Glucose-Capillary: 124 mg/dL — ABNORMAL HIGH (ref 65–99)
Glucose-Capillary: 118 mg/dL — ABNORMAL HIGH (ref 65–99)
Glucose-Capillary: 205 mg/dL — ABNORMAL HIGH (ref 65–99)

## 2015-11-25 LAB — BASIC METABOLIC PANEL
ANION GAP: 10 (ref 5–15)
BUN: 19 mg/dL (ref 6–20)
CO2: 25 mmol/L (ref 22–32)
Calcium: 7.9 mg/dL — ABNORMAL LOW (ref 8.9–10.3)
Chloride: 108 mmol/L (ref 101–111)
Creatinine, Ser: 0.8 mg/dL (ref 0.44–1.00)
GFR calc Af Amer: >60 mL/min (ref >60)
GLUCOSE: 132 mg/dL — AB (ref 65–99)
POTASSIUM: 3.6 mmol/L (ref 3.5–5.1)
Sodium: 143 mmol/L (ref 135–145)

## 2015-11-25 NOTE — Progress Notes (Signed)
PROGRESS NOTE  Nancy Glover  [id number] DOB: 1934/04/30 DOA: 11/24/2015 PCP: [name], MD  Subjective: Feels better than yesterday, some pain.  Brief Narrative:  82 years old with history of DM 2, PMR on chronic steroids came into the hospital complaining about left flank pain associated with nausea and vomiting. Urinalysis consistent with UTI. Vitals consistent with sepsis.  Assessment & Plan:   Principal Problem:   Sepsis ([place]) Active Problems:   Hypertension   Diabetes mellitus ([place])   Nephrolithiasis   Lactic acidosis   SOB (shortness of breath)   Nausea with vomiting   HLD (hyperlipidemia)   GERD without esophagitis   Sepsis Presented with fever of 102.6, WBC of 17.6 and presence of UTI/pyelonephritis. Slightly elevated lactic acid of 2.08, went down to 1.1 after aggressive IV fluid hydration. Patient hydrated with IV fluids, started on broad-spectrum antibiotics.  Acute pyelonephritis/UTI CT scan showed left perinephric edema consistent with acute left-sided pyelonephritis secondary to obstructing UPJ stone. Status post retrograde ureterogram and placement of left sided ureteric stent done by Dr. Louis Meckel. Patient is on Zosyn, continue. Will adjust antibiotics according to culture results. PRN antiemetics, antipyretics and analgesics ordered  Hypertension -stable -Will continue amlodipine and Cozaar  Diabetes mellitus While inpatient will hold oral hypoglycemic agents and will use a sliding scale insulin Follow CBG's and adjust as needed, check A1c.  Nephrolithiasis Urology has been consulted, status post placement of left-sided ureteral stent  Lactic acidosis Very mild dilatation of lactate at 2.08, this is likely secondary to sepsis. This is resolved after aggressive IV fluid hydration.  SOB -Without any prior history of COPD -Patient with copious episode of vomiting the morning of admission and with concerns of potential  pneumonitis/aspiration -Chest x-ray is currently clear; But the patient is requiring oxygen supplementation to keep her oxygen saturation above 90% -Will continue oxygen supplementation as needed, start the use of flutter valve and she will be covered with Zosyn  Nausea with vomiting -Associated with episode of nephrolithiasis/UTI/sepsis -Will provide fluid resuscitation and as needed antiemetics  HLD (hyperlipidemia) -Will continue statins  GERD without esophagitis -Continue PPI  PMR: Appears to be a stable -Continue chronic prednisone, 6 mg daily, does not have hypotension to suggest to use stress dose of steroids.   DVT prophylaxis: SQ heparin Code Status: Full Code Family Communication: Plan discussed with the patient Disposition Plan: Discharge when culture is back, patient afebrile Diet: Diet heart healthy/carb modified Room service appropriate?: Yes; Fluid consistency:: Thin  Consultants:   Urology  Procedures:   Left-sided retrograde ureteropyelogram with placement of ureteral stent  Antimicrobials:   Zosyn   Objective: Filed Vitals:   11/24/15 1750 11/24/15 1759 11/24/15 2328 11/25/15 0505  BP:  142/55 131/56 146/61  Pulse: 82 93 81 79  Temp:  100 F (37.8 C) 99.3 F (37.4 C) 98.8 F (37.1 C)  TempSrc:   Oral Oral  Resp:  16 18 18   Height:      Weight:      SpO2: 93% 96% 95% 96%    Intake/Output Summary (Last 24 hours) at 11/25/15 1148 Last data filed at 11/25/15 0900  Gross per 24 hour  Intake   2630 ml  Output    610 ml  Net   2020 ml   Filed Weights   11/24/15 0504 11/24/15 1238  Weight: 68.04 kg (150 lb) 73.3 kg (161 lb 9.6 oz)    Examination: General exam: Appears calm and comfortable  Respiratory system: Clear to auscultation.  Respiratory effort normal. Cardiovascular system: S1 & S2 heard, RRR. No JVD, murmurs, rubs, gallops or clicks. No pedal edema. Gastrointestinal system: Abdomen is nondistended, soft and nontender. No  organomegaly or masses felt. Normal bowel sounds heard. Central nervous system: Alert and oriented. No focal neurological deficits. Extremities: Symmetric 5 x 5 power. Skin: No rashes, lesions or ulcers Psychiatry: Judgement and insight appear normal. Mood & affect appropriate.   Data Reviewed: I have personally reviewed following labs and imaging studies  CBC:  Recent Labs Lab 11/24/15 0603 11/25/15 0510  WBC 17.6* 15.0*  NEUTROABS 12.1*  --   HGB 9.0* 7.8*  HCT 28.7* 24.8*  MCV 67.8* 68.5*  PLT 275 [id number]   Basic Metabolic Panel:  Recent Labs Lab 11/24/15 0603 11/24/15 1829 11/25/15 0510  NA 141  --  143  K 3.9  --  3.6  CL 107  --  108  CO2 24  --  25  GLUCOSE 133*  --  132*  BUN 22*  --  19  CREATININE 0.90  --  0.80  CALCIUM 9.1  --  7.9*  MG  --  1.3*  --   PHOS  --  3.5  --    GFR: Estimated Creatinine Clearance: 53.8 mL/min (by C-G formula based on Cr of 0.8). Liver Function Tests:  Recent Labs Lab 11/24/15 1829  AST 13*  ALT 12*  ALKPHOS 58  BILITOT 1.0  PROT 5.7*  ALBUMIN 3.2*   No results for input(s): LIPASE, AMYLASE in the last 168 hours. No results for input(s): AMMONIA in the last 168 hours. Coagulation Profile:  Recent Labs Lab 11/24/15 1829  INR 1.15   Cardiac Enzymes: No results for input(s): CKTOTAL, CKMB, CKMBINDEX, TROPONINI in the last 168 hours. BNP (last 3 results) No results for input(s): PROBNP in the last 8760 hours. HbA1C: No results for input(s): HGBA1C in the last 72 hours. CBG:  Recent Labs Lab 11/24/15 1720 11/24/15 1817 11/24/15 2330 11/25/15 0458 11/25/15 0742  GLUCAP 144* 188* 230* 124* 107*   Lipid Profile: No results for input(s): CHOL, HDL, LDLCALC, TRIG, CHOLHDL, LDLDIRECT in the last 72 hours. Thyroid Function Tests: No results for input(s): TSH, T4TOTAL, FREET4, T3FREE, THYROIDAB in the last 72 hours. Anemia Panel: No results for input(s): VITAMINB12, FOLATE, FERRITIN, TIBC, IRON, RETICCTPCT in  the last 72 hours. Urine analysis:    Component Value Date/Time   COLORURINE YELLOW 11/24/2015 [place] 11/24/2015 0634   LABSPEC 1.014 11/24/2015 0634   PHURINE 6.0 11/24/2015 0634   GLUCOSEU NEGATIVE 11/24/2015 0634   HGBUR TRACE* 11/24/2015 0634   BILIRUBINUR NEGATIVE 11/24/2015 0634   [place] 11/24/2015 0634   PROTEINUR 30* 11/24/2015 0634   UROBILINOGEN 1.0 05/17/2015 2053   NITRITE NEGATIVE 11/24/2015 0634   LEUKOCYTESUR TRACE* 11/24/2015 0634   Sepsis Labs: @LABRCNTIP (procalcitonin:4,lacticidven:4)  ) Recent Results (from the past 240 hour(s))  Urine culture     Status: Abnormal   Collection Time: 11/24/15  6:34 AM  Result Value Ref Range Status   Specimen Description URINE, CLEAN CATCH  Final   Special Requests Normal  Final   Culture MULTIPLE SPECIES PRESENT, SUGGEST RECOLLECTION (A)  Final   Report Status 11/25/2015 FINAL  Final  Blood Culture (routine x 2)     Status: None (Preliminary result)   Collection Time: 11/24/15 11:06 AM  Result Value Ref Range Status   Specimen Description BLOOD RIGHT HAND  Final   Special Requests   Final  BOTTLES DRAWN AEROBIC AND ANAEROBIC 5CC Performed at [facility]    Culture PENDING  Incomplete   Report Status PENDING  Incomplete  Surgical pcr screen     Status: Abnormal   Collection Time: 11/24/15  2:07 PM  Result Value Ref Range Status   MRSA, PCR POSITIVE (A) NEGATIVE Final    Comment: RESULT CALLED TO, READ BACK BY AND VERIFIED WITH: SLADE,R @ 1726 ON ON:[id number] BY POTEAT,S    Staphylococcus aureus POSITIVE (A) NEGATIVE Final    Comment:        The Xpert SA Assay (FDA approved for NASAL specimens in patients over 62 years of age), is one component of a comprehensive surveillance program.  Test performance has been validated by [facility] for patients greater than or equal to 89 year old. It is not intended to diagnose infection nor to guide or monitor treatment.       Invalid input(s): PROCALCITONIN, LACTICACIDVEN   Radiology Studies: Dg Chest 2 View  11/24/2015  CLINICAL DATA:  Shortness of breath, weakness, left flank pain, chest pain EXAM: CHEST  2 VIEW COMPARISON:  05/17/2015 FINDINGS: Borderline cardiomegaly. No infiltrate or pleural effusion. No pulmonary edema. Osteopenia and mild degenerative changes thoracic spine. Stable mild compression deformity mid thoracic spine. Atherosclerotic calcifications of thoracic aorta. IMPRESSION: No active cardiopulmonary disease. Osteopenia and mild degenerative changes thoracic spine. Electronically Signed   By: Lahoma Crocker M.D.   On: 11/24/2015 09:58   Dg Chest Port 1 View  11/24/2015  CLINICAL DATA:  Sepsis EXAM: PORTABLE CHEST 1 VIEW COMPARISON:  11/24/2015 FINDINGS: Chronic cardiomegaly and aortic tortuosity/ widening. Evaluation of the mediastinum is limited due to distortion by rightward rotation. Interstitial coarsening that is likely bronchitic, without edema or pneumonia. No effusion or pneumothorax. IMPRESSION: No acute finding or change from earlier today. Electronically Signed   By: Monte Fantasia M.D.   On: 11/24/2015 15:42   Ct Renal Stone Study  11/24/2015  CLINICAL DATA:  Left flank pain. EXAM: CT ABDOMEN AND PELVIS WITHOUT CONTRAST TECHNIQUE: Multidetector CT imaging of the abdomen and pelvis was performed following the standard protocol without IV contrast. COMPARISON:  None. FINDINGS: Lower chest and abdominal wall:  Small hiatal hernia. Hepatobiliary: No focal liver abnormality.Cholelithiasis without signs of cholecystitis. No biliary distention. Pancreas: Unremarkable. Spleen: Unremarkable. Adrenals/Urinary Tract:  Negative adrenals. 6 mm left UPJ calculus with mild hydronephrosis and asymmetric left perinephric edema and renal expansion. Bilateral nephrolithiasis with largest stone on the right measuring 8 mm in the upper pole and on the left measuring 10 mm in the interpolar region. Negative bladder.  Reproductive:No pathologic findings. Stomach/Bowel: No obstruction. No appendicitis. Colonic diverticulosis. Vascular/Lymphatic: Extensive atherosclerosis of the aorta and branch vessels. No mass or adenopathy. Peritoneal: No ascites or pneumoperitoneum. Musculoskeletal: No acute abnormalities. IMPRESSION: 1. Obstructing 6 mm left UPJ calculus. 2. Bilateral nephrolithiasis with calculi measuring up to 10 mm on the left. 3. Cholelithiasis. Electronically Signed   By: Monte Fantasia M.D.   On: 11/24/2015 08:20        Scheduled Meds: . amitriptyline  25 mg Oral Daily  . amLODipine  5 mg Oral q morning - 10a  . aspirin EC  81 mg Oral Daily  . atorvastatin  20 mg Oral Daily  . heparin  5,000 Units Subcutaneous Q8H  . insulin aspart  0-15 Units Subcutaneous TID WC  . insulin aspart  0-5 Units Subcutaneous QHS  . losartan  100 mg Oral q morning - 10a  .  omeprazole  20 mg Oral Daily  . piperacillin-tazobactam (ZOSYN)  IV  3.375 g Intravenous Q8H  . predniSONE  6 mg Oral q morning - 10a   Continuous Infusions: . sodium chloride 100 mL/hr at 11/25/15 0943     LOS: 1 day    Time spent: 35 minutes    Erna Brossard A, MD [facility] Pager [phone]  If 7PM-7AM, please contact night-coverage [email or web address] Password [email or web address] 11/25/2015, 11:48 AM

## 2015-11-25 NOTE — Addendum Note (Signed)
Addendum  created 11/25/15 1001 by Lollie Sails, CRNA   Modules edited: Charges VN

## 2015-11-25 NOTE — Progress Notes (Signed)
S/p left ureteral stent placement  Interval: Patient tolerated the procedure, was transferred to the PACU, has been afebrile since. The patient states that she feels better today, has no ache in her side. No acute events.  PE: Filed Vitals:   11/24/15 1750 11/24/15 1759 11/24/15 2328 11/25/15 0505  BP:  142/55 131/56 146/61  Pulse: 82 93 81 79  Temp:  100 F (37.8 C) 99.3 F (37.4 C) 98.8 F (37.1 C)  TempSrc:   Oral Oral  Resp:  16 18 18   Height:      Weight:      SpO2: 93% 96% 95% 96%    Intake/Output Summary (Last 24 hours) at 11/25/15 1130 Last data filed at 11/25/15 0900  Gross per 24 hour  Intake   2630 ml  Output    610 ml  Net   2020 ml   NAD Non-labored breathing Abdomen is soft   Recent Labs  11/24/15 0603 11/25/15 0510  WBC 17.6* 15.0*  HGB 9.0* 7.8*  HCT 28.7* 24.8*    Recent Labs  11/24/15 0603 11/25/15 0510  NA 141 143  K 3.9 3.6  CL 107 108  CO2 24 25  GLUCOSE 133* 132*  BUN 22* 19  CREATININE 0.90 0.80  CALCIUM 9.1 7.9*    Recent Labs  11/24/15 1829  INR 1.15   No results for input(s): PSA in the last 72 hours. No results for input(s): LABURIN in the last 72 hours. Results for orders placed or performed during the hospital encounter of 11/24/15  Urine culture     Status: Abnormal   Collection Time: 11/24/15  6:34 AM  Result Value Ref Range Status   Specimen Description URINE, CLEAN CATCH  Final   Special Requests Normal  Final   Culture MULTIPLE SPECIES PRESENT, SUGGEST RECOLLECTION (A)  Final   Report Status 11/25/2015 FINAL  Final  Blood Culture (routine x 2)     Status: None (Preliminary result)   Collection Time: 11/24/15 11:06 AM  Result Value Ref Range Status   Specimen Description BLOOD RIGHT HAND  Final   Special Requests   Final    BOTTLES DRAWN AEROBIC AND ANAEROBIC 5CC Performed at [facility]    Culture PENDING  Incomplete   Report Status PENDING  Incomplete  Surgical pcr screen     Status: Abnormal   Collection Time: 11/24/15  2:07 PM  Result Value Ref Range Status   MRSA, PCR POSITIVE (A) NEGATIVE Final    Comment: RESULT CALLED TO, READ BACK BY AND VERIFIED WITH: SLADE,R @ 1726 ON ON:[id number] BY POTEAT,S    Staphylococcus aureus POSITIVE (A) NEGATIVE Final    Comment:        The Xpert SA Assay (FDA approved for NASAL specimens in patients over 68 years of age), is one component of a comprehensive surveillance program.  Test performance has been validated by [facility] for patients greater than or equal to 36 year old. It is not intended to diagnose infection nor to guide or monitor treatment.      Imp: Patient had fever and obstructing stone. Her urine cultures appear to be a contaminate, she is MRSA positive. Blood cultures are negative to date. She's been afebrile since yesterday afternoon.  Recommendation: The patient should be transitioned to oral antibiotics, broad-spectrum, should cover MRSA. Bactrim may be a good option for her. DC Foley catheter Patient will be scheduled for follow-up 2 weeks with Dr. Karsten Ro, M.D. for scheduling of her ureteroscopy  and stone removal. We will see this patient on as-needed basis. Please page for any additional questions or concerns.

## 2015-11-25 NOTE — Discharge Instructions (Signed)
DISCHARGE INSTRUCTIONS FOR KIDNEY STONE/URETERAL STENT   MEDICATIONS:  1.  Resume all your other meds from home - except do not take any extra narcotic pain meds that you may have at home.  2. Take the antibiotic as prescribed until you have completed the entire prescription  ACTIVITY:  1. No strenuous activity x 1week  2. No driving while on narcotic pain medications  3. Drink plenty of water  4. Continue to walk at home - you can still get blood clots when you are at home, so keep active, but don't over do it.  5. May return to work/school tomorrow or when you feel ready   BATHING:  1. You can shower and we recommend daily showers  2. You have a string coming from your urethra: The stent string is attached to your ureteral stent. Do not pull on this.   SIGNS/SYMPTOMS TO CALL:  Please call us if you have a fever greater than 101.5, uncontrolled nausea/vomiting, uncontrolled pain, dizziness, unable to urinate, bloody urine, chest pain, shortness of breath, leg swelling, leg pain, redness around wound, drainage from wound, or any other concerns or questions.   You can reach [place] at [phone].   FOLLOW-UP:  1. You have an appointment in 2 weeks with Dr. Karsten Ro to discuss removal of your stone.

## 2015-11-26 LAB — BASIC METABOLIC PANEL
Anion gap: 11 (ref 5–15)
BUN: 19 mg/dL (ref 6–20)
CHLORIDE: 109 mmol/L (ref 101–111)
CO2: 24 mmol/L (ref 22–32)
CREATININE: 0.69 mg/dL (ref 0.44–1.00)
Calcium: 7.7 mg/dL — ABNORMAL LOW (ref 8.9–10.3)
GFR calc Af Amer: >60 mL/min (ref >60)
GFR calc non Af Amer: >60 mL/min (ref >60)
Glucose, Bld: 109 mg/dL — ABNORMAL HIGH (ref 65–99)
POTASSIUM: 3.2 mmol/L — AB (ref 3.5–5.1)
Sodium: 144 mmol/L (ref 135–145)

## 2015-11-26 LAB — GLUCOSE, CAPILLARY
GLUCOSE-CAPILLARY: 160 mg/dL — AB (ref 65–99)
Glucose-Capillary: 96 mg/dL (ref 65–99)
Glucose-Capillary: 96 mg/dL (ref 65–99)
Glucose-Capillary: 153 mg/dL — ABNORMAL HIGH (ref 65–99)

## 2015-11-26 LAB — CBC
HEMATOCRIT: 25 % — AB (ref 36.0–46.0)
Hemoglobin: 7.8 g/dL — ABNORMAL LOW (ref 12.0–15.0)
MCH: 21.4 pg — AB (ref 26.0–34.0)
MCHC: 31.2 g/dL (ref 30.0–36.0)
MCV: 68.5 fL — AB (ref 78.0–100.0)
PLATELETS: 198 10*3/uL (ref 150–400)
RBC: 3.65 MIL/uL — AB (ref 3.87–5.11)
RDW: 20.6 % — AB (ref 11.5–15.5)
WBC: 9.5 10*3/uL (ref 4.0–10.5)

## 2015-11-26 LAB — HEMOGLOBIN A1C
Hgb A1c MFr Bld: 8.2 % — ABNORMAL HIGH (ref 4.8–5.6)
MEAN PLASMA GLUCOSE: 189 mg/dL

## 2015-11-26 MED ORDER — POTASSIUM CHLORIDE CRYS ER 20 MEQ PO TBCR
40.0000 meq | EXTENDED_RELEASE_TABLET | Freq: Four times a day (QID) | ORAL | Status: AC
  Administered 2015-11-26 (×2): 40 meq via ORAL
  Filled 2015-11-26 (×2): qty 2

## 2015-11-26 NOTE — Progress Notes (Addendum)
PROGRESS NOTE  Nancy Glover  [id number] DOB: July 20, 1933 DOA: 11/24/2015 PCP: [name], MD  Subjective: Complaining about mild left-sided flank pain  Brief Narrative:  82 years old with history of DM 2, PMR on chronic steroids came into the hospital complaining about left flank pain associated with nausea and vomiting. Urinalysis consistent with UTI. Vitals consistent with sepsis.  Assessment & Plan:   Principal Problem:   Sepsis ([place]) Active Problems:   Hypertension   Diabetes mellitus ([place])   Nephrolithiasis   Lactic acidosis   SOB (shortness of breath)   Nausea with vomiting   HLD (hyperlipidemia)   GERD without esophagitis   Sepsis Presented with fever of 102.6, WBC of 17.6 and presence of UTI/pyelonephritis. Slightly elevated lactic acid of 2.08, went down to 1.1 after aggressive IV fluid hydration. Patient hydrated with IV fluids, started on broad-spectrum antibiotics. Sepsis physiology resolved. Ambulate  Acute pyelonephritis/UTI CT scan showed left perinephric edema consistent with acute left-sided pyelonephritis secondary to obstructing UPJ stone. Status post retrograde ureterogram and placement of left sided ureteric stent done by Dr. Louis Meckel. Patient is on Zosyn, continue. Will adjust antibiotics according to culture results. PRN antiemetics, antipyretics and analgesics ordered. The urine culture showed multiple morphotypes, continue current antibiotics.  Hypertension -stable -Will continue amlodipine and Cozaar  Diabetes mellitus While inpatient will hold oral hypoglycemic agents and will use a sliding scale insulin Follow CBG's and adjust as needed, check A1c.  Nephrolithiasis Urology has been consulted, status post placement of left-sided ureteral stent  Lactic acidosis Very mild dilatation of lactate at 2.08, this is likely secondary to sepsis. This is resolved after aggressive IV fluid hydration.  SOB -Without any prior history of  COPD -Patient with copious episode of vomiting the morning of admission and with concerns of potential pneumonitis/aspiration -Chest x-ray is currently clear; But the patient is requiring oxygen supplementation to keep her oxygen saturation above 90% -Will continue oxygen supplementation as needed, start the use of flutter valve and she will be covered with Zosyn  Nausea with vomiting -Associated with episode of nephrolithiasis/UTI/sepsis -Will provide fluid resuscitation and as needed antiemetics  HLD (hyperlipidemia) -Will continue statins  GERD without esophagitis -Continue PPI  PMR: Appears to be a stable -Continue chronic prednisone, 6 mg daily, does not have hypotension to suggest to use stress dose of steroids.   DVT prophylaxis: SQ heparin Code Status: Full Code Family Communication: Plan discussed with the patient Disposition Plan: Discharge when culture is back, patient afebrile Diet: Diet heart healthy/carb modified Room service appropriate?: Yes; Fluid consistency:: Thin  Consultants:   Urology  Procedures:   Left-sided retrograde ureteropyelogram with placement of ureteral stent  Antimicrobials:   Zosyn   Objective: Filed Vitals:   11/25/15 1500 11/25/15 1611 11/25/15 2058 11/26/15 0400  BP:  169/65 156/61 179/69  Pulse: 88  79 65  Temp: 98.7 F (37.1 C)  99 F (37.2 C) 98.1 F (36.7 C)  TempSrc: Oral  Oral Oral  Resp: 18  18 18   Height:      Weight:      SpO2: 97%  95% 93%    Intake/Output Summary (Last 24 hours) at 11/26/15 1210 Last data filed at 11/26/15 0930  Gross per 24 hour  Intake   3270 ml  Output    300 ml  Net   2970 ml   Filed Weights   11/24/15 0504 11/24/15 1238  Weight: 68.04 kg (150 lb) 73.3 kg (161 lb 9.6 oz)  Examination: General exam: Appears calm and comfortable  Respiratory system: Clear to auscultation. Respiratory effort normal. Cardiovascular system: S1 & S2 heard, RRR. No JVD, murmurs, rubs, gallops or  clicks. No pedal edema. Gastrointestinal system: Abdomen is nondistended, soft and nontender. No organomegaly or masses felt. Normal bowel sounds heard. Central nervous system: Alert and oriented. No focal neurological deficits. Extremities: Symmetric 5 x 5 power. Skin: No rashes, lesions or ulcers Psychiatry: Judgement and insight appear normal. Mood & affect appropriate.   Data Reviewed: I have personally reviewed following labs and imaging studies  CBC:  Recent Labs Lab 11/24/15 0603 11/25/15 0510 11/26/15 0341  WBC 17.6* 15.0* 9.5  NEUTROABS 12.1*  --   --   HGB 9.0* 7.8* 7.8*  HCT 28.7* 24.8* 25.0*  MCV 67.8* 68.5* 68.5*  PLT 275 205 [id number]   Basic Metabolic Panel:  Recent Labs Lab 11/24/15 0603 11/24/15 1829 11/25/15 0510 11/26/15 0341  NA 141  --  143 144  K 3.9  --  3.6 3.2*  CL 107  --  108 109  CO2 24  --  25 24  GLUCOSE 133*  --  132* 109*  BUN 22*  --  19 19  CREATININE 0.90  --  0.80 0.69  CALCIUM 9.1  --  7.9* 7.7*  MG  --  1.3*  --   --   PHOS  --  3.5  --   --    GFR: Estimated Creatinine Clearance: 53.8 mL/min (by C-G formula based on Cr of 0.69). Liver Function Tests:  Recent Labs Lab 11/24/15 1829  AST 13*  ALT 12*  ALKPHOS 58  BILITOT 1.0  PROT 5.7*  ALBUMIN 3.2*   No results for input(s): LIPASE, AMYLASE in the last 168 hours. No results for input(s): AMMONIA in the last 168 hours. Coagulation Profile:  Recent Labs Lab 11/24/15 1829  INR 1.15   Cardiac Enzymes: No results for input(s): CKTOTAL, CKMB, CKMBINDEX, TROPONINI in the last 168 hours. BNP (last 3 results) No results for input(s): PROBNP in the last 8760 hours. HbA1C:  Recent Labs  11/24/15 1829  HGBA1C 8.2*   CBG:  Recent Labs Lab 11/25/15 1207 11/25/15 1654 11/25/15 2055 11/26/15 0411 11/26/15 0759  GLUCAP 118* 205* 227* 96 96   Lipid Profile: No results for input(s): CHOL, HDL, LDLCALC, TRIG, CHOLHDL, LDLDIRECT in the last 72 hours. Thyroid Function  Tests: No results for input(s): TSH, T4TOTAL, FREET4, T3FREE, THYROIDAB in the last 72 hours. Anemia Panel: No results for input(s): VITAMINB12, FOLATE, FERRITIN, TIBC, IRON, RETICCTPCT in the last 72 hours. Urine analysis:    Component Value Date/Time   COLORURINE YELLOW 11/24/2015 [place] 11/24/2015 0634   LABSPEC 1.014 11/24/2015 0634   PHURINE 6.0 11/24/2015 0634   GLUCOSEU NEGATIVE 11/24/2015 0634   HGBUR TRACE* 11/24/2015 0634   BILIRUBINUR NEGATIVE 11/24/2015 0634   [place] 11/24/2015 0634   PROTEINUR 30* 11/24/2015 0634   UROBILINOGEN 1.0 05/17/2015 2053   NITRITE NEGATIVE 11/24/2015 0634   LEUKOCYTESUR TRACE* 11/24/2015 0634   Sepsis Labs: @LABRCNTIP (procalcitonin:4,lacticidven:4)  ) Recent Results (from the past 240 hour(s))  Urine culture     Status: Abnormal   Collection Time: 11/24/15  6:34 AM  Result Value Ref Range Status   Specimen Description URINE, CLEAN CATCH  Final   Special Requests Normal  Final   Culture MULTIPLE SPECIES PRESENT, SUGGEST RECOLLECTION (A)  Final   Report Status 11/25/2015 FINAL  Final  Blood Culture (routine x  2)     Status: None (Preliminary result)   Collection Time: 11/24/15 11:06 AM  Result Value Ref Range Status   Specimen Description BLOOD RIGHT HAND  Final   Special Requests BOTTLES DRAWN AEROBIC AND ANAEROBIC 5CC  Final   Culture   Final    NO GROWTH < 24 HOURS Performed at [facility]    Report Status PENDING  Incomplete  Blood Culture (routine x 2)     Status: None (Preliminary result)   Collection Time: 11/24/15 11:50 AM  Result Value Ref Range Status   Specimen Description BLOOD LEFT ANTECUBITAL  Final   Special Requests BOTTLES DRAWN AEROBIC AND ANAEROBIC 5CC  Final   Culture   Final    NO GROWTH < 24 HOURS Performed at [facility]    Report Status PENDING  Incomplete  Surgical pcr screen     Status: Abnormal   Collection Time: 11/24/15  2:07 PM  Result Value Ref  Range Status   MRSA, PCR POSITIVE (A) NEGATIVE Final    Comment: RESULT CALLED TO, READ BACK BY AND VERIFIED WITH: SLADE,R @ 1726 ON UX:[id number] BY POTEAT,S    Staphylococcus aureus POSITIVE (A) NEGATIVE Final    Comment:        The Xpert SA Assay (FDA approved for NASAL specimens in patients over 46 years of age), is one component of a comprehensive surveillance program.  Test performance has been validated by [facility] for patients greater than or equal to 11 year old. It is not intended to diagnose infection nor to guide or monitor treatment.      Invalid input(s): PROCALCITONIN, LACTICACIDVEN   Radiology Studies: Dg Chest Port 1 View  11/24/2015  CLINICAL DATA:  Sepsis EXAM: PORTABLE CHEST 1 VIEW COMPARISON:  11/24/2015 FINDINGS: Chronic cardiomegaly and aortic tortuosity/ widening. Evaluation of the mediastinum is limited due to distortion by rightward rotation. Interstitial coarsening that is likely bronchitic, without edema or pneumonia. No effusion or pneumothorax. IMPRESSION: No acute finding or change from earlier today. Electronically Signed   By: Monte Fantasia M.D.   On: 11/24/2015 15:42        Scheduled Meds: . amitriptyline  25 mg Oral Daily  . amLODipine  5 mg Oral q morning - 10a  . aspirin EC  81 mg Oral Daily  . atorvastatin  20 mg Oral Daily  . heparin  5,000 Units Subcutaneous Q8H  . insulin aspart  0-15 Units Subcutaneous TID WC  . insulin aspart  0-5 Units Subcutaneous QHS  . losartan  100 mg Oral q morning - 10a  . omeprazole  20 mg Oral Daily  . piperacillin-tazobactam (ZOSYN)  IV  3.375 g Intravenous Q8H  . potassium chloride  40 mEq Oral Q6H  . predniSONE  6 mg Oral q morning - 10a   Continuous Infusions:     LOS: 2 days    Time spent: 35 minutes    Omere Marti A, MD [facility] Pager [phone]  If 7PM-7AM, please contact night-coverage [email or web address] Password [email or web address] 11/26/2015, 12:10 PM

## 2015-11-26 NOTE — Evaluation (Addendum)
Physical Therapy Evaluation Patient Details Name: Nancy Glover MRN: [id number] DOB: [birth year] Today's Date: 11/26/2015   History of Present Illness  82 yo female admitted with sepsis. s/p L ureteral stent 11/24/15. hx of DM, chronic steroid use, HTN  Clinical Impression  On eval, pt required Min assist for mobility-walked ~75 feet. Pt is very unsteady and fatigues easily with activity. O2 sats dropped to 80% on RA during ambulation-made NT aware. Recommend RW use for ambulation at this time. Pt states she has a walker at home already. Recommend HHPT as long as husband can provide current level of assist.     Follow Up Recommendations Home health PT;Supervision/Assistance - 24 hour    Equipment Recommendations  None recommended by PT (pt already has a walker at home)    Recommendations for Other Services       Precautions / Restrictions Precautions Precautions: Fall Precaution Comments: incontinent Restrictions Weight Bearing Restrictions: No      Mobility  Bed Mobility Overal bed mobility: Needs Assistance Bed Mobility: Supine to Sit     Supine to sit: Min assist     General bed mobility comments: Assist for trunk to upright. Increased time.   Transfers Overall transfer level: Needs assistance Equipment used: Rolling walker (2 wheeled);Straight cane Transfers: Sit to/from Stand Sit to Stand: Min assist         General transfer comment: Assist to rise, stabilize, control descent. VCs safety, hand placement  Ambulation/Gait Ambulation/Gait assistance: Min assist Ambulation Distance (Feet): 75 Feet Assistive device: Straight cane (IV pole) Gait Pattern/deviations: Step-through pattern;Decreased stride length     General Gait Details: Pt is unsteady. Began ambulation with only the support of cane however pt was unable to safely walk. Allowed pt to hold on to IV pole as well. O2 sats 80% on RA. Dyspnea 2/4. Audible wheezing.   Stairs            Wheelchair  Mobility    Modified Rankin (Stroke Patients Only)       Balance Overall balance assessment: Needs assistance         Standing balance support: During functional activity Standing balance-Leahy Scale: Poor                               Pertinent Vitals/Pain Pain Assessment: 0-10 Pain Score: 5  Pain Location: L side Pain Descriptors / Indicators: Sore Pain Intervention(s): Repositioned    Home Living Family/patient expects to be discharged to:: Private residence Living Arrangements: Spouse/significant other   Type of Home: House Home Access: Stairs to enter   Technical brewer of Steps: 4 Home Layout: One level Home Equipment: Environmental consultant - 2 wheels;Cane - single point      Prior Function Level of Independence: Independent with assistive device(s)         Comments: uses cane for ambulation     Hand Dominance        Extremity/Trunk Assessment   Upper Extremity Assessment: Generalized weakness           Lower Extremity Assessment: Generalized weakness      Cervical / Trunk Assessment: Normal  Communication   Communication: No difficulties  Cognition Arousal/Alertness: Awake/alert Behavior During Therapy: WFL for tasks assessed/performed Overall Cognitive Status: Within Functional Limits for tasks assessed                      General Comments      Exercises  Assessment/Plan    PT Assessment Patient needs continued PT services  PT Diagnosis Difficulty walking;Generalized weakness   PT Problem List Decreased strength;Decreased activity tolerance;Decreased balance;Decreased mobility;Decreased knowledge of use of DME  PT Treatment Interventions DME instruction;Gait training;Functional mobility training;Therapeutic activities;Patient/family education;Balance training;Therapeutic exercise   PT Goals (Current goals can be found in the Care Plan section) Acute Rehab PT Goals Patient Stated Goal: to get better PT  Goal Formulation: With patient Time For Goal Achievement: 12/10/15 Potential to Achieve Goals: Good    Frequency Min 3X/week   Barriers to discharge        Co-evaluation               End of Session Equipment Utilized During Treatment: Gait belt Activity Tolerance: Patient limited by fatigue Patient left: in chair;with call bell/phone within reach           Time: 1004-1034 PT Time Calculation (min) (ACUTE ONLY): 30 min   Charges:   PT Evaluation $PT Eval Low Complexity: 1 Procedure PT Treatments $Gait Training: 8-22 mins   PT G Codes:        Weston Anna, MPT Pager: [phone]

## 2015-11-27 LAB — BASIC METABOLIC PANEL
Anion gap: 9 (ref 5–15)
BUN: 13 mg/dL (ref 6–20)
CHLORIDE: 110 mmol/L (ref 101–111)
CO2: 24 mmol/L (ref 22–32)
Calcium: 8.1 mg/dL — ABNORMAL LOW (ref 8.9–10.3)
Creatinine, Ser: 0.64 mg/dL (ref 0.44–1.00)
Glucose, Bld: 116 mg/dL — ABNORMAL HIGH (ref 65–99)
POTASSIUM: 3.5 mmol/L (ref 3.5–5.1)
SODIUM: 143 mmol/L (ref 135–145)

## 2015-11-27 LAB — GLUCOSE, CAPILLARY
GLUCOSE-CAPILLARY: 225 mg/dL — AB (ref 65–99)
Glucose-Capillary: 98 mg/dL (ref 65–99)
Glucose-Capillary: 222 mg/dL — ABNORMAL HIGH (ref 65–99)

## 2015-11-27 LAB — CBC
HEMATOCRIT: 26.9 % — AB (ref 36.0–46.0)
Hemoglobin: 8.5 g/dL — ABNORMAL LOW (ref 12.0–15.0)
MCH: 21.7 pg — ABNORMAL LOW (ref 26.0–34.0)
MCHC: 31.6 g/dL (ref 30.0–36.0)
MCV: 68.6 fL — AB (ref 78.0–100.0)
PLATELETS: 205 10*3/uL (ref 150–400)
RBC: 3.92 MIL/uL (ref 3.87–5.11)
RDW: 20.4 % — ABNORMAL HIGH (ref 11.5–15.5)
WBC: 9.8 10*3/uL (ref 4.0–10.5)

## 2015-11-27 MED ORDER — CEFUROXIME AXETIL 500 MG PO TABS: 500.0000 mg | ORAL_TABLET | Freq: Two times a day (BID) | ORAL | Status: DC

## 2015-11-27 MED ORDER — POTASSIUM CHLORIDE CRYS ER 20 MEQ PO TBCR
40.0000 meq | EXTENDED_RELEASE_TABLET | Freq: Once | ORAL | Status: AC
  Administered 2015-11-27: 40 meq via ORAL
  Filled 2015-11-27: qty 2

## 2015-11-27 MED ORDER — DOXYCYCLINE HYCLATE 100 MG PO CAPS: 100.0000 mg | ORAL_CAPSULE | Freq: Two times a day (BID) | ORAL | Status: DC

## 2015-11-27 NOTE — Discharge Summary (Signed)
Physician Discharge Summary  Nancy Glover IRJ:[id number] DOB: 1933-09-07 DOA: 11/24/2015  PCP: [name], MD  Admit date: 11/24/2015 Discharge date: 11/27/2015  Time spent: 40 minutes  Recommendations for Outpatient Follow-up:  1. Follow-up with primary care physician within one week. 2. Follow-up with Dr. Karsten Ro in 2 weeks   Discharge Diagnoses:  Principal Problem:   Sepsis [facility]) Active Problems:   Hypertension   Diabetes mellitus ([place])   Nephrolithiasis   Lactic acidosis   SOB (shortness of breath)   Nausea with vomiting   HLD (hyperlipidemia)   GERD without esophagitis   Discharge Condition: Stable  Diet recommendation: Heart healthy  Filed Weights   11/24/15 0504 11/24/15 1238  Weight: 68.04 kg (150 lb) 73.3 kg (161 lb 9.6 oz)    History of present illness:  Nancy Glover is a 82 y.o. female with PMH significant for hypertension, type 2 diabetes, hyperlipidemia and PMR (on chronic steroids); who presented to the ED complaining of left flank pain with associated nausea and vomiting. Her symptoms has been present for the last 12 hours or so and no subsiding. Patient also has elevated temperature today (MAXIMUM TEMPERATURE 102.6). Her episode of nausea/vomiting occur while she was in the emergency department being evaluated for her flank pain. The pain was 8-9/10 in intensity, stabbing in quality and constant; she endorses no radiation, no alleviating factors. Reports that is worse with movement.  Patient denies chest pain, hematochezia, melena, headaches, palpitations or any other acute complaints.  ED Course: A CT scan was done demonstrating obstructing 10 mm left UPJ; patient blood cultures and urine cultures were taken; IV fluids initiated and a dose of Rocephin given. Urology was consulted and [facility] has been called to admit the patient for further evaluation and treatment. Patient met criteria for sepsis on presentation with tachycardia, temperature  to 102.6, WBCs of 17,000; elevated lactic acid, source of infection her urine and also shortness of breath with hypoxia (even last one appears to be associated with episode of pneumonitis after copious vomiting episodes)  Hospital Course:   Sepsis Presented with fever of 102.6, WBC of 17.6 and presence of UTI/pyelonephritis. Slightly elevated lactic acid of 2.08, went down to 1.1 after aggressive IV fluid hydration. Patient hydrated with IV fluids, started on broad-spectrum antibiotics. Sepsis physiology resolved prior to discharge.  Acute pyelonephritis/UTI CT scan showed left perinephric edema consistent with acute left pyelonephritis secondary to obstructing UPJ stone. Status post retrograde ureterogram and placement of left sided ureteric stent done by Dr. Louis Meckel. Patient is on Zosyn, continue. Will adjust antibiotics according to culture results. PRN antiemetics, antipyretics and analgesics ordered. The urine culture showed multiple morphotypes, urology recommended MRSA coverage as well (allergic to sulfa). Discharge on Ceftin and doxycycline for 7 more days to complete 10 days of antibiotics.  Hypertension -Was slightly elevated while she was in the hospital, home medications continued. -Norvasc and Cozaar continued.  Diabetes mellitus, uncontrolled Was on insulin sliding scale and carbohydrate modified diet. Hemoglobin A1c is 8.2, correlate with mean plasma glucose of 189. Follow-up with primary care physician for further adjustment of her regimen  Nephrolithiasis Urology has been consulted, status post placement of left-sided ureteral stent  Lactic acidosis Very mild dilatation of lactate at 2.08, this is likely secondary to sepsis. This is resolved after aggressive IV fluid hydration.  SOB -Without any prior history of COPD -Patient with copious episode of vomiting the morning of admission and with concerns of potential pneumonitis/aspiration -Chest x-ray is currently  clear; But the patient is requiring oxygen supplementation to keep her oxygen saturation above 90% -Will continue oxygen supplementation as needed, start the use of flutter valve and she will be covered with Zosyn. -SOB resolved prior to discharge.  Nausea with vomiting -Associated with episode of nephrolithiasis/UTI/sepsis -Will provide fluid resuscitation and as needed antiemetics  HLD (hyperlipidemia) -Will continue statins  GERD without esophagitis -Continue PPI, I have told her she might have some heartburn symptoms while she was taking the doxycycline.  PMR: Appears to be a stable -Continue chronic prednisone, 6 mg daily, does not have hypotension to suggest to use stress dose of steroids.   Procedures:  Cystoscopy, left retrograde pyelography and left ureteral stent placement. Done by Dr. Louis Meckel on 11/24/2015  Consultations:  Urology  Discharge Exam: Filed Vitals:   11/26/15 1345 11/27/15 0618  BP: 170/67 155/72  Pulse: 89 82  Temp: 99.7 F (37.6 C) 98.7 F (37.1 C)  Resp: 24 20   General: Alert and awake, oriented x3, not in any acute distress. HEENT: anicteric sclera, pupils reactive to light and accommodation, EOMI CVS: S1-S2 clear, no murmur rubs or gallops Chest: clear to auscultation bilaterally, no wheezing, rales or rhonchi Abdomen: soft nontender, nondistended, normal bowel sounds, no organomegaly Extremities: no cyanosis, clubbing or edema noted bilaterally Neuro: Cranial nerves II-XII intact, no focal neurological deficits  Discharge Instructions   Discharge Instructions    Diet - low sodium heart healthy    Complete by:  As directed      Increase activity slowly    Complete by:  As directed           Current Discharge Medication List    START taking these medications   Details  cefUROXime (CEFTIN) 500 MG tablet Take 1 tablet (500 mg total) by mouth 2 (two) times daily with a meal. Qty: 14 tablet, Refills: 0    doxycycline  (VIBRAMYCIN) 100 MG capsule Take 1 capsule (100 mg total) by mouth 2 (two) times daily. Qty: 14 capsule, Refills: 0      CONTINUE these medications which have NOT CHANGED   Details  acetaminophen (TYLENOL) 500 MG tablet Take 1,000 mg by mouth every 6 (six) hours as needed (leg pain).    amitriptyline (ELAVIL) 25 MG tablet Take 25 mg by mouth daily.     amLODipine (NORVASC) 5 MG tablet Take 5 mg by mouth every morning.    aspirin 325 MG tablet Take 325 mg by mouth every 6 (six) hours as needed for moderate pain or headache.    aspirin EC 81 MG tablet Take 81 mg by mouth daily.    atorvastatin (LIPITOR) 20 MG tablet Take 20 mg by mouth daily.    Calcium Carbonate-Vitamin D (CALCIUM + D PO) Take 600 mg by mouth daily.    furosemide (LASIX) 20 MG tablet Take 20 mg by mouth daily as needed (leg swelling).     glucose blood (ACCU-CHEK AVIVA PLUS) test strip Check upto 2x daily Qty: 100 each, Refills: 3    ibuprofen (ADVIL,MOTRIN) 200 MG tablet Take 200-400 mg by mouth every 6 (six) hours as needed for headache.    losartan (COZAAR) 100 MG tablet Take 100 mg by mouth every morning.    metFORMIN (GLUCOPHAGE) 1000 MG tablet Take 1,000 mg by mouth 2 (two) times daily with a meal.    omeprazole (PRILOSEC OTC) 20 MG tablet Take 20 mg by mouth every morning.     potassium chloride (K-DUR) 10 MEQ tablet Take  10 mEq by mouth daily.    !! predniSONE (DELTASONE) 1 MG tablet Take 1 mg by mouth every morning. Take with 72m tablet to make 625m   !! predniSONE (DELTASONE) 5 MG tablet Take 5 mg by mouth every morning. Take with 72m42mablet to make 6mg14m repaglinide (PRANDIN) 1 MG tablet before meals: 1 tab before Bfst and lunch and 2 before supper Qty: 120 tablet, Refills: 3    trimethoprim (TRIMPEX) 100 MG tablet Take 100 mg by mouth daily as needed (for irritation (uti) take for abour 3-4 days).     VITAMIN B1-B12 IM Inject 1,000 mcg/mL into the muscle every 30 (thirty) days.     !! -  Potential duplicate medications found. Please discuss with provider.    STOP taking these medications     ciprofloxacin (CIPRO) 500 MG tablet        Allergies  Allergen Reactions  . Actos [Pioglitazone] Hives and Swelling    Body swelling  . Sulfa Antibiotics Swelling    Tongue and mouth swelling   Follow-up Information    Follow up with [facility] In 2 weeks.   Specialty:  Urology   Contact information:   [phone]    The results of significant diagnostics from this hospitalization (including imaging, microbiology, ancillary and laboratory) are listed below for reference.    Significant Diagnostic Studies: Dg Chest 2 View  11/24/2015  CLINICAL DATA:  Shortness of breath, weakness, left flank pain, chest pain EXAM: CHEST  2 VIEW COMPARISON:  05/17/2015 FINDINGS: Borderline cardiomegaly. No infiltrate or pleural effusion. No pulmonary edema. Osteopenia and mild degenerative changes thoracic spine. Stable mild compression deformity mid thoracic spine. Atherosclerotic calcifications of thoracic aorta. IMPRESSION: No active cardiopulmonary disease. Osteopenia and mild degenerative changes thoracic spine. Electronically Signed   By: LiviLahoma Crocker.   On: 11/24/2015 09:58   Dg Chest Port 1 View  11/24/2015  CLINICAL DATA:  Sepsis EXAM: PORTABLE CHEST 1 VIEW COMPARISON:  11/24/2015 FINDINGS: Chronic cardiomegaly and aortic tortuosity/ widening. Evaluation of the mediastinum is limited due to distortion by rightward rotation. Interstitial coarsening that is likely bronchitic, without edema or pneumonia. No effusion or pneumothorax. IMPRESSION: No acute finding or change from earlier today. Electronically Signed   By: JonaMonte Fantasia.   On: 11/24/2015 15:42   Ct Renal Stone Study  11/24/2015  CLINICAL DATA:  Left flank pain. EXAM: CT ABDOMEN AND PELVIS WITHOUT CONTRAST TECHNIQUE: Multidetector CT imaging of the abdomen and pelvis was  performed following the standard protocol without IV contrast. COMPARISON:  None. FINDINGS: Lower chest and abdominal wall:  Small hiatal hernia. Hepatobiliary: No focal liver abnormality.Cholelithiasis without signs of cholecystitis. No biliary distention. Pancreas: Unremarkable. Spleen: Unremarkable. Adrenals/Urinary Tract:  Negative adrenals. 6 mm left UPJ calculus with mild hydronephrosis and asymmetric left perinephric edema and renal expansion. Bilateral nephrolithiasis with largest stone on the right measuring 8 mm in the upper pole and on the left measuring 10 mm in the interpolar region. Negative bladder. Reproductive:No pathologic findings. Stomach/Bowel: No obstruction. No appendicitis. Colonic diverticulosis. Vascular/Lymphatic: Extensive atherosclerosis of the aorta and branch vessels. No mass or adenopathy. Peritoneal: No ascites or pneumoperitoneum. Musculoskeletal: No acute abnormalities. IMPRESSION: 1. Obstructing 6 mm left UPJ calculus. 2. Bilateral nephrolithiasis with calculi measuring up to 10 mm on the left. 3. Cholelithiasis. Electronically Signed   By: JonaMonte Fantasia.   On: 11/24/2015 08:20  Microbiology: Recent Results (from the past 240 hour(s))  Urine culture     Status: Abnormal   Collection Time: 11/24/15  6:34 AM  Result Value Ref Range Status   Specimen Description URINE, CLEAN CATCH  Final   Special Requests Normal  Final   Culture MULTIPLE SPECIES PRESENT, SUGGEST RECOLLECTION (A)  Final   Report Status 11/25/2015 FINAL  Final  Blood Culture (routine x 2)     Status: None (Preliminary result)   Collection Time: 11/24/15 11:06 AM  Result Value Ref Range Status   Specimen Description BLOOD RIGHT HAND  Final   Special Requests BOTTLES DRAWN AEROBIC AND ANAEROBIC 5CC  Final   Culture   Final    NO GROWTH 2 DAYS Performed at [facility]    Report Status PENDING  Incomplete  Blood Culture (routine x 2)     Status: None (Preliminary result)   Collection  Time: 11/24/15 11:50 AM  Result Value Ref Range Status   Specimen Description BLOOD LEFT ANTECUBITAL  Final   Special Requests BOTTLES DRAWN AEROBIC AND ANAEROBIC 5CC  Final   Culture   Final    NO GROWTH 2 DAYS Performed at [facility]    Report Status PENDING  Incomplete  Surgical pcr screen     Status: Abnormal   Collection Time: 11/24/15  2:07 PM  Result Value Ref Range Status   MRSA, PCR POSITIVE (A) NEGATIVE Final    Comment: RESULT CALLED TO, READ BACK BY AND VERIFIED WITH: SLADE,R @ 1726 ON [id number] BY POTEAT,S    Staphylococcus aureus POSITIVE (A) NEGATIVE Final    Comment:        The Xpert SA Assay (FDA approved for NASAL specimens in patients over 30 years of age), is one component of a comprehensive surveillance program.  Test performance has been validated by [facility] for patients greater than or equal to 67 year old. It is not intended to diagnose infection nor to guide or monitor treatment.      Labs: Basic Metabolic Panel:  Recent Labs Lab 11/24/15 0603 11/24/15 1829 11/25/15 0510 11/26/15 0341 11/27/15 0341  NA 141  --  143 144 143  K 3.9  --  3.6 3.2* 3.5  CL 107  --  108 109 110  CO2 24  --  '25 24 24  ' GLUCOSE 133*  --  132* 109* 116*  BUN 22*  --  '19 19 13  ' CREATININE 0.90  --  0.80 0.69 0.64  CALCIUM 9.1  --  7.9* 7.7* 8.1*  MG  --  1.3*  --   --   --   PHOS  --  3.5  --   --   --    Liver Function Tests:  Recent Labs Lab 11/24/15 1829  AST 13*  ALT 12*  ALKPHOS 58  BILITOT 1.0  PROT 5.7*  ALBUMIN 3.2*   No results for input(s): LIPASE, AMYLASE in the last 168 hours. No results for input(s): AMMONIA in the last 168 hours. CBC:  Recent Labs Lab 11/24/15 0603 11/25/15 0510 11/26/15 0341 11/27/15 0341  WBC 17.6* 15.0* 9.5 9.8  NEUTROABS 12.1*  --   --   --   HGB 9.0* 7.8* 7.8* 8.5*  HCT 28.7* 24.8* 25.0* 26.9*  MCV 67.8* 68.5* 68.5* 68.6*  PLT 275 205 198 205   Cardiac Enzymes: No results for input(s):  CKTOTAL, CKMB, CKMBINDEX, TROPONINI in the last 168 hours. BNP: BNP (last 3 results) No results  for input(s): BNP in the last 8760 hours.  ProBNP (last 3 results) No results for input(s): PROBNP in the last 8760 hours.  CBG:  Recent Labs Lab 11/26/15 0411 11/26/15 0759 11/26/15 1205 11/26/15 1654 11/27/15 0816  GLUCAP 96 96 153* 160* 98       Signed:  Willia Lampert A MD.  [facility] 11/27/2015, 9:13 AM

## 2015-11-27 NOTE — Progress Notes (Signed)
Physical Therapy Treatment Patient Details Name: Nancy Glover MRN: [id number] DOB: 12-31-1933 Today's Date: 11/27/2015    History of Present Illness 82 yo female admitted with sepsis. s/p L ureteral stent 11/24/15. hx of DM, chronic steroid use, HTN    PT Comments    Pt in bed on 2 lts sats 96%.  Removed O2 for trial.  Assisted OOB to amb a great distance.  Avg RA sat 92%.  Assisted to bathroom then back to bed.   Pt will NOT need home O2.  Follow Up Recommendations  Home health PT;Supervision/Assistance - 24 hour     Equipment Recommendations  None recommended by PT    Recommendations for Other Services       Precautions / Restrictions Precautions Precaution Comments: monitor sats Restrictions Weight Bearing Restrictions: No    Mobility  Bed Mobility Overal bed mobility: Needs Assistance Bed Mobility: Supine to Sit;Sit to Supine     Supine to sit: Supervision Sit to supine: Supervision   General bed mobility comments: increased time  Transfers Overall transfer level: Needs assistance Equipment used: Rolling walker (2 wheeled) Transfers: Sit to/from Stand Sit to Stand: Supervision;Min guard         General transfer comment: one VC on safety with turns due to IV line otherwise good.   Ambulation/Gait Ambulation/Gait assistance: Supervision;Min guard Ambulation Distance (Feet): 85 Feet Assistive device: Rolling walker (2 wheeled) Gait Pattern/deviations: Step-through pattern Gait velocity: WFL   General Gait Details: used RW this time for increased safety wide hallway.  Pt admits she will use her cane in the house stating "the walker is too bulky".  Amb on RA sats avg 92%.    Stairs            Wheelchair Mobility    Modified Rankin (Stroke Patients Only)       Balance                                    Cognition Arousal/Alertness: Awake/alert Behavior During Therapy: WFL for tasks assessed/performed Overall Cognitive Status:  Within Functional Limits for tasks assessed                      Exercises      General Comments        Pertinent Vitals/Pain      Home Living                      Prior Function            PT Goals (current goals can now be found in the care plan section) Progress towards PT goals: Progressing toward goals    Frequency  Min 3X/week    PT Plan Current plan remains appropriate    Co-evaluation             End of Session Equipment Utilized During Treatment: Gait belt Activity Tolerance: Patient tolerated treatment well Patient left: in bed;with call bell/phone within reach;with bed alarm set     Time: 1120-1145 PT Time Calculation (min) (ACUTE ONLY): 25 min  Charges:  $Gait Training: 8-22 mins $Therapeutic Activity: 8-22 mins                    G Codes:      Rica Koyanagi  PTA [facility]  Acute  Rehab Pager      [phone]

## 2015-11-27 NOTE — Care Management Important Message (Signed)
Important Message  Patient Details IM Letter given to Cookie/Case Manager to present to Patient Name: Nancy Glover MRN: [id number] Date of Birth: 11/13/1933   Medicare Important Message Given:  Yes    Camillo Flaming 11/27/2015, 10:44 AMImportant Message  Patient Details  Name: Nancy Glover MRN: [id number] Date of Birth: 11/01/1933   Medicare Important Message Given:  Yes    Camillo Flaming 11/27/2015, 10:44 AM

## 2015-11-27 NOTE — Progress Notes (Signed)
Pt selected [facility] for [facility]. Referral given to in house rep.

## 2015-11-29 LAB — CULTURE, BLOOD (ROUTINE X 2)
CULTURE: NO GROWTH
CULTURE: NO GROWTH

## 2015-11-30 NOTE — ED Provider Notes (Signed)
CSN: [id number]     Arrival date & time 11/24/15  0456 History   First MD Initiated Contact with Patient 11/24/15 0701     Chief Complaint  Patient presents with  . Flank Pain      HPI Patient presents with flank pain that started at 3am.  Hx urinary freqency and urgency.  Fever noted at home.  Nausea and vomiting noted.History of kidney stone. Past Medical History  Diagnosis Date  . Hypertension   . Diabetes mellitus, type 2 ([place])   . Ulcer of right leg ([place])   . Venous insufficiency, peripheral   . Macular degeneration of left eye   . PONV (postoperative nausea and vomiting)   . Hyperlipidemia   . PMR (polymyalgia rheumatica) ([place]) 2003   Past Surgical History  Procedure Laterality Date  . Cervical biopsy  w/ loop electrode excision  01-17-2001  DR LOMAX  . Cysto/ hod/ bladder bx  10-23-2006  DR PETERSON  . Cataract extraction w/ intraocular lens implant Right   . Esophagogastroduodenoscopy N/A 09/15/2012    Procedure: ESOPHAGOGASTRODUODENOSCOPY (EGD);  Surgeon: Winfield Cunas., MD;  Location: [facility];  Service: Endoscopy;  Laterality: N/A;  . Tonsillectomy    . Eye surgery      rt cataract  . Fracture surgery  1975    lt elbow-fx  . Skin split graft Left 04/19/2013    Procedure: SKIN GRAFT FULL THICKNESS TO LEFT LEG FROM ABDOMEN;  Surgeon: Pedro Earls, MD;  Location: [facility];  Service: General;  Laterality: Left;  . Cystoscopy with stent placement Left 11/24/2015    Procedure: CYSTOSCOPY WITH STENT PLACEMENT left ureter left retrograde pylegram;  Surgeon: Ardis Hughs, MD;  Location: [facility];  Service: Urology;  Laterality: Left;   Family History  Problem Relation Age of Onset  . Colon cancer Father   . Heart disease Father   . Hypertension Father   . Heart attack Father   . Cancer Mother   . Heart disease Mother   . Diabetes Neg Hx    Social History  Substance Use Topics  . Smoking status: Former Smoker -- 1.00 packs/day for 40  years    Types: Cigarettes    Quit date: 09/13/1991  . Smokeless tobacco: Never Used  . Alcohol Use: No   OB History    No data available     Review of Systems    Allergies  Actos and Sulfa antibiotics  Home Medications   Prior to Admission medications   Medication Sig Start Date End Date Taking? Authorizing Provider  acetaminophen (TYLENOL) 500 MG tablet Take 1,000 mg by mouth every 6 (six) hours as needed (leg pain).   Yes Historical Provider, MD  amitriptyline (ELAVIL) 25 MG tablet Take 25 mg by mouth daily.  05/17/13  Yes Historical Provider, MD  amLODipine (NORVASC) 5 MG tablet Take 5 mg by mouth every morning.   Yes Historical Provider, MD  aspirin 325 MG tablet Take 325 mg by mouth every 6 (six) hours as needed for moderate pain or headache.   Yes Historical Provider, MD  aspirin EC 81 MG tablet Take 81 mg by mouth daily.   Yes Historical Provider, MD  atorvastatin (LIPITOR) 20 MG tablet Take 20 mg by mouth daily.   Yes Historical Provider, MD  Calcium Carbonate-Vitamin D (CALCIUM + D PO) Take 600 mg by mouth daily.   Yes Historical Provider, MD  furosemide (LASIX) 20 MG tablet Take 20 mg by  mouth daily as needed (leg swelling).    Yes Historical Provider, MD  glucose blood (ACCU-CHEK AVIVA PLUS) test strip Check upto 2x daily 03/04/15  Yes Elayne Snare, MD  ibuprofen (ADVIL,MOTRIN) 200 MG tablet Take 200-400 mg by mouth every 6 (six) hours as needed for headache.   Yes Historical Provider, MD  losartan (COZAAR) 100 MG tablet Take 100 mg by mouth every morning.   Yes Historical Provider, MD  metFORMIN (GLUCOPHAGE) 1000 MG tablet Take 1,000 mg by mouth 2 (two) times daily with a meal.   Yes Historical Provider, MD  omeprazole (PRILOSEC OTC) 20 MG tablet Take 20 mg by mouth every morning.    Yes Historical Provider, MD  potassium chloride (K-DUR) 10 MEQ tablet Take 10 mEq by mouth daily.   Yes Historical Provider, MD  predniSONE (DELTASONE) 1 MG tablet Take 1 mg by mouth every  morning. Take with 5mg  tablet to make 6mg    Yes Historical Provider, MD  predniSONE (DELTASONE) 5 MG tablet Take 5 mg by mouth every morning. Take with 1mg  tablet to make 6mg    Yes Historical Provider, MD  repaglinide (PRANDIN) 1 MG tablet before meals: 1 tab before Bfst and lunch and 2 before supper Patient taking differently: Take 1-2 mg by mouth 3 (three) times daily before meals. Take 1mg  before breakfast and lunch and then take 2mg  before supper 12/04/14  Yes Elayne Snare, MD  trimethoprim (TRIMPEX) 100 MG tablet Take 100 mg by mouth daily as needed (for irritation (uti) take for abour 3-4 days).    Yes Historical Provider, MD  VITAMIN B1-B12 IM Inject 1,000 mcg/mL into the muscle every 30 (thirty) days.   Yes Historical Provider, MD  cefUROXime (CEFTIN) 500 MG tablet Take 1 tablet (500 mg total) by mouth 2 (two) times daily with a meal. 11/27/15   Verlee Monte, MD  doxycycline (VIBRAMYCIN) 100 MG capsule Take 1 capsule (100 mg total) by mouth 2 (two) times daily. 11/27/15   Mutaz Elmahi, MD   BP 180/80 mmHg  Pulse 88  Temp(Src) 98.7 F (37.1 C) (Oral)  Resp 20  Ht 5\' 3"  (1.6 m)  Wt 161 lb 9.6 oz (73.3 kg)  BMI 28.63 kg/m2  SpO2 92% Physical Exam  Constitutional: She is oriented to person, place, and time. She appears well-developed and well-nourished.  HENT:  Head: Normocephalic and atraumatic.  Eyes: Pupils are equal, round, and reactive to light.  Neck: Normal range of motion.  Cardiovascular: Normal rate and intact distal pulses.   Pulmonary/Chest: No respiratory distress.  Abdominal: Normal appearance. She exhibits no distension, no fluid wave and no ascites. There is no hepatosplenomegaly. There is tenderness. There is CVA tenderness.  Musculoskeletal: Normal range of motion.  Neurological: She is alert and oriented to person, place, and time. No cranial nerve deficit.  Skin: Skin is warm and dry. No rash noted.  Psychiatric: She has a normal mood and affect. Her behavior is  normal.  Nursing note and vitals reviewed.   ED Course  Procedures (including critical care time) Labs Review   Results for orders placed or performed during the hospital encounter of 11/24/15  Urine culture  Result Value Ref Range   Specimen Description URINE, CLEAN CATCH    Special Requests Normal    Culture MULTIPLE SPECIES PRESENT, SUGGEST RECOLLECTION (A)    Report Status 11/25/2015 FINAL   Blood Culture (routine x 2)  Result Value Ref Range   Specimen Description BLOOD RIGHT HAND    Special Requests  BOTTLES DRAWN AEROBIC AND ANAEROBIC 5CC    Culture      NO GROWTH 5 DAYS Performed at [facility]    Report Status 11/29/2015 FINAL   Blood Culture (routine x 2)  Result Value Ref Range   Specimen Description BLOOD LEFT ANTECUBITAL    Special Requests BOTTLES DRAWN AEROBIC AND ANAEROBIC 5CC    Culture      NO GROWTH 5 DAYS Performed at [facility]    Report Status 11/29/2015 FINAL   Surgical pcr screen  Result Value Ref Range   MRSA, PCR POSITIVE (A) NEGATIVE   Staphylococcus aureus POSITIVE (A) NEGATIVE  Basic metabolic panel  Result Value Ref Range   Sodium 141 135 - 145 mmol/L   Potassium 3.9 3.5 - 5.1 mmol/L   Chloride 107 101 - 111 mmol/L   CO2 24 22 - 32 mmol/L   Glucose, Bld 133 (H) 65 - 99 mg/dL   BUN 22 (H) 6 - 20 mg/dL   Creatinine, Ser 0.90 0.44 - 1.00 mg/dL   Calcium 9.1 8.9 - 10.3 mg/dL   GFR calc non Af Amer 59 (L) >60 mL/min   GFR calc Af Amer >60 >60 mL/min   Anion gap 10 5 - 15  CBC with Differential  Result Value Ref Range   WBC 17.6 (H) 4.0 - 10.5 K/uL   RBC 4.23 3.87 - 5.11 MIL/uL   Hemoglobin 9.0 (L) 12.0 - 15.0 g/dL   HCT 28.7 (L) 36.0 - 46.0 %   MCV 67.8 (L) 78.0 - 100.0 fL   MCH 21.3 (L) 26.0 - 34.0 pg   MCHC 31.4 30.0 - 36.0 g/dL   RDW 20.8 (H) 11.5 - 15.5 %   Platelets 275 150 - 400 K/uL   Neutrophils Relative % 69 %   Lymphocytes Relative 22 %   Monocytes Relative 8 %   Eosinophils Relative 1 %   Basophils  Relative 0 %   Neutro Abs 12.1 (H) 1.7 - 7.7 K/uL   Lymphs Abs 3.9 0.7 - 4.0 K/uL   Monocytes Absolute 1.4 (H) 0.1 - 1.0 K/uL   Eosinophils Absolute 0.2 0.0 - 0.7 K/uL   Basophils Absolute 0.0 0.0 - 0.1 K/uL   RBC Morphology POLYCHROMASIA PRESENT    Smear Review LARGE PLATELETS PRESENT   Urinalysis, Routine w reflex microscopic  Result Value Ref Range   Color, Urine YELLOW YELLOW   APPearance CLEAR CLEAR   Specific Gravity, Urine 1.014 1.005 - 1.030   pH 6.0 5.0 - 8.0   Glucose, UA NEGATIVE NEGATIVE mg/dL   Hgb urine dipstick TRACE (A) NEGATIVE   Bilirubin Urine NEGATIVE NEGATIVE   Ketones, ur NEGATIVE NEGATIVE mg/dL   Protein, ur 30 (A) NEGATIVE mg/dL   Nitrite NEGATIVE NEGATIVE   Leukocytes, UA TRACE (A) NEGATIVE  Urine microscopic-add on  Result Value Ref Range   Squamous Epithelial / LPF 0-5 (A) NONE SEEN   WBC, UA 6-30 0 - 5 WBC/hpf   RBC / HPF 0-5 0 - 5 RBC/hpf   Bacteria, UA RARE (A) NONE SEEN  Glucose, capillary  Result Value Ref Range   Glucose-Capillary 170 (H) 65 - 99 mg/dL  Magnesium  Result Value Ref Range   Magnesium 1.3 (L) 1.7 - 2.4 mg/dL  Phosphorus  Result Value Ref Range   Phosphorus 3.5 2.5 - 4.6 mg/dL  Hemoglobin A1c  Result Value Ref Range   Hgb A1c MFr Bld 8.2 (H) 4.8 - 5.6 %   Mean Plasma  Glucose 189 mg/dL  Hepatic function panel  Result Value Ref Range   Total Protein 5.7 (L) 6.5 - 8.1 g/dL   Albumin 3.2 (L) 3.5 - 5.0 g/dL   AST 13 (L) 15 - 41 U/L   ALT 12 (L) 14 - 54 U/L   Alkaline Phosphatase 58 38 - 126 U/L   Total Bilirubin 1.0 0.3 - 1.2 mg/dL   Bilirubin, Direct 0.2 0.1 - 0.5 mg/dL   Indirect Bilirubin 0.8 0.3 - 0.9 mg/dL  Lactic acid, plasma  Result Value Ref Range   Lactic Acid, Venous 1.6 0.5 - 2.0 mmol/L  Lactic acid, plasma  Result Value Ref Range   Lactic Acid, Venous 1.1 0.5 - 2.0 mmol/L  Procalcitonin  Result Value Ref Range   Procalcitonin 11.59 ng/mL  Protime-INR  Result Value Ref Range   Prothrombin Time 14.9 11.6 -  15.2 seconds   INR 1.15 0.00 - 1.49  APTT  Result Value Ref Range   aPTT 32 24 - 37 seconds  Basic metabolic panel  Result Value Ref Range   Sodium 143 135 - 145 mmol/L   Potassium 3.6 3.5 - 5.1 mmol/L   Chloride 108 101 - 111 mmol/L   CO2 25 22 - 32 mmol/L   Glucose, Bld 132 (H) 65 - 99 mg/dL   BUN 19 6 - 20 mg/dL   Creatinine, Ser 0.80 0.44 - 1.00 mg/dL   Calcium 7.9 (L) 8.9 - 10.3 mg/dL   GFR calc non Af Amer >60 >60 mL/min   GFR calc Af Amer >60 >60 mL/min   Anion gap 10 5 - 15  CBC  Result Value Ref Range   WBC 15.0 (H) 4.0 - 10.5 K/uL   RBC 3.62 (L) 3.87 - 5.11 MIL/uL   Hemoglobin 7.8 (L) 12.0 - 15.0 g/dL   HCT 24.8 (L) 36.0 - 46.0 %   MCV 68.5 (L) 78.0 - 100.0 fL   MCH 21.5 (L) 26.0 - 34.0 pg   MCHC 31.5 30.0 - 36.0 g/dL   RDW 20.3 (H) 11.5 - 15.5 %   Platelets 205 150 - 400 K/uL  Glucose, capillary  Result Value Ref Range   Glucose-Capillary 144 (H) 65 - 99 mg/dL   Comment 1 Notify RN    Comment 2 Document in Chart   Glucose, capillary  Result Value Ref Range   Glucose-Capillary 188 (H) 65 - 99 mg/dL  Glucose, capillary  Result Value Ref Range   Glucose-Capillary 230 (H) 65 - 99 mg/dL  Glucose, capillary  Result Value Ref Range   Glucose-Capillary 124 (H) 65 - 99 mg/dL  Glucose, capillary  Result Value Ref Range   Glucose-Capillary 107 (H) 65 - 99 mg/dL  Glucose, capillary  Result Value Ref Range   Glucose-Capillary 118 (H) 65 - 99 mg/dL  Glucose, capillary  Result Value Ref Range   Glucose-Capillary 205 (H) 65 - 99 mg/dL  Basic metabolic panel  Result Value Ref Range   Sodium 144 135 - 145 mmol/L   Potassium 3.2 (L) 3.5 - 5.1 mmol/L   Chloride 109 101 - 111 mmol/L   CO2 24 22 - 32 mmol/L   Glucose, Bld 109 (H) 65 - 99 mg/dL   BUN 19 6 - 20 mg/dL   Creatinine, Ser 0.69 0.44 - 1.00 mg/dL   Calcium 7.7 (L) 8.9 - 10.3 mg/dL   GFR calc non Af Amer >60 >60 mL/min   GFR calc Af Amer >60 >60 mL/min   Anion gap 11  5 - 15  CBC  Result Value Ref Range    WBC 9.5 4.0 - 10.5 K/uL   RBC 3.65 (L) 3.87 - 5.11 MIL/uL   Hemoglobin 7.8 (L) 12.0 - 15.0 g/dL   HCT 25.0 (L) 36.0 - 46.0 %   MCV 68.5 (L) 78.0 - 100.0 fL   MCH 21.4 (L) 26.0 - 34.0 pg   MCHC 31.2 30.0 - 36.0 g/dL   RDW 20.6 (H) 11.5 - 15.5 %   Platelets 198 150 - 400 K/uL  Glucose, capillary  Result Value Ref Range   Glucose-Capillary 227 (H) 65 - 99 mg/dL  Glucose, capillary  Result Value Ref Range   Glucose-Capillary 96 65 - 99 mg/dL  Glucose, capillary  Result Value Ref Range   Glucose-Capillary 96 65 - 99 mg/dL   Comment 1 Notify RN    Comment 2 Document in Chart   Glucose, capillary  Result Value Ref Range   Glucose-Capillary 153 (H) 65 - 99 mg/dL   Comment 1 Notify RN    Comment 2 Document in Chart   Basic metabolic panel  Result Value Ref Range   Sodium 143 135 - 145 mmol/L   Potassium 3.5 3.5 - 5.1 mmol/L   Chloride 110 101 - 111 mmol/L   CO2 24 22 - 32 mmol/L   Glucose, Bld 116 (H) 65 - 99 mg/dL   BUN 13 6 - 20 mg/dL   Creatinine, Ser 0.64 0.44 - 1.00 mg/dL   Calcium 8.1 (L) 8.9 - 10.3 mg/dL   GFR calc non Af Amer >60 >60 mL/min   GFR calc Af Amer >60 >60 mL/min   Anion gap 9 5 - 15  CBC  Result Value Ref Range   WBC 9.8 4.0 - 10.5 K/uL   RBC 3.92 3.87 - 5.11 MIL/uL   Hemoglobin 8.5 (L) 12.0 - 15.0 g/dL   HCT 26.9 (L) 36.0 - 46.0 %   MCV 68.6 (L) 78.0 - 100.0 fL   MCH 21.7 (L) 26.0 - 34.0 pg   MCHC 31.6 30.0 - 36.0 g/dL   RDW 20.4 (H) 11.5 - 15.5 %   Platelets 205 150 - 400 K/uL  Glucose, capillary  Result Value Ref Range   Glucose-Capillary 160 (H) 65 - 99 mg/dL   Comment 1 Notify RN    Comment 2 Document in Chart   Glucose, capillary  Result Value Ref Range   Glucose-Capillary 98 65 - 99 mg/dL   Comment 1 Notify RN    Comment 2 Document in Chart   Glucose, capillary  Result Value Ref Range   Glucose-Capillary 225 (H) 65 - 99 mg/dL  Glucose, capillary  Result Value Ref Range   Glucose-Capillary 222 (H) 65 - 99 mg/dL   Comment 1 Notify RN     Comment 2 Document in Chart   I-Stat CG4 Lactic Acid, ED  (not at  [facility])  Result Value Ref Range   Lactic Acid, Venous 2.08 (HH) 0.5 - 2.0 mmol/L   Comment NOTIFIED PHYSICIAN    Dg Chest 2 View  11/24/2015  CLINICAL DATA:  Shortness of breath, weakness, left flank pain, chest pain EXAM: CHEST  2 VIEW COMPARISON:  05/17/2015 FINDINGS: Borderline cardiomegaly. No infiltrate or pleural effusion. No pulmonary edema. Osteopenia and mild degenerative changes thoracic spine. Stable mild compression deformity mid thoracic spine. Atherosclerotic calcifications of thoracic aorta. IMPRESSION: No active cardiopulmonary disease. Osteopenia and mild degenerative changes thoracic spine. Electronically Signed   By: Orlean Bradford.D.  On: 11/24/2015 09:58   Dg Chest Port 1 View  11/24/2015  CLINICAL DATA:  Sepsis EXAM: PORTABLE CHEST 1 VIEW COMPARISON:  11/24/2015 FINDINGS: Chronic cardiomegaly and aortic tortuosity/ widening. Evaluation of the mediastinum is limited due to distortion by rightward rotation. Interstitial coarsening that is likely bronchitic, without edema or pneumonia. No effusion or pneumothorax. IMPRESSION: No acute finding or change from earlier today. Electronically Signed   By: Monte Fantasia M.D.   On: 11/24/2015 15:42   Ct Renal Stone Study  11/24/2015  CLINICAL DATA:  Left flank pain. EXAM: CT ABDOMEN AND PELVIS WITHOUT CONTRAST TECHNIQUE: Multidetector CT imaging of the abdomen and pelvis was performed following the standard protocol without IV contrast. COMPARISON:  None. FINDINGS: Lower chest and abdominal wall:  Small hiatal hernia. Hepatobiliary: No focal liver abnormality.Cholelithiasis without signs of cholecystitis. No biliary distention. Pancreas: Unremarkable. Spleen: Unremarkable. Adrenals/Urinary Tract:  Negative adrenals. 6 mm left UPJ calculus with mild hydronephrosis and asymmetric left perinephric edema and renal expansion. Bilateral nephrolithiasis with largest stone on the  right measuring 8 mm in the upper pole and on the left measuring 10 mm in the interpolar region. Negative bladder. Reproductive:No pathologic findings. Stomach/Bowel: No obstruction. No appendicitis. Colonic diverticulosis. Vascular/Lymphatic: Extensive atherosclerosis of the aorta and branch vessels. No mass or adenopathy. Peritoneal: No ascites or pneumoperitoneum. Musculoskeletal: No acute abnormalities. IMPRESSION: 1. Obstructing 6 mm left UPJ calculus. 2. Bilateral nephrolithiasis with calculi measuring up to 10 mm on the left. 3. Cholelithiasis. Electronically Signed   By: Monte Fantasia M.D.   On: 11/24/2015 08:20    I spoke with urology who wanted patient admitted for stent placement.  Hospitalist contactaced for admission  MDM   Final diagnoses:  Sepsis ([place])        Leonard Schwartz, MD 11/30/15 [phone]

## 2015-12-04 ENCOUNTER — Ambulatory Visit: Payer: Medicare Other | Admitting: Endocrinology

## 2015-12-09 ENCOUNTER — Other Ambulatory Visit: Payer: Self-pay | Admitting: Urology

## 2015-12-10 ENCOUNTER — Encounter (HOSPITAL_COMMUNITY): Payer: Self-pay | Admitting: *Deleted

## 2015-12-13 ENCOUNTER — Other Ambulatory Visit: Payer: Self-pay | Admitting: Endocrinology

## 2015-12-17 ENCOUNTER — Ambulatory Visit (HOSPITAL_COMMUNITY)
Admission: RE | Admit: 2015-12-17 | Discharge: 2015-12-17 | Disposition: A | Discharge status: Home / Self Care | Payer: Medicare Other | Source: Ambulatory Visit | Attending: Urology | Admitting: Urology

## 2015-12-17 ENCOUNTER — Encounter (HOSPITAL_COMMUNITY): Payer: Self-pay | Admitting: *Deleted

## 2015-12-17 ENCOUNTER — Encounter (HOSPITAL_COMMUNITY)
Admission: RE | Disposition: A | Discharge status: Home / Self Care | Payer: Self-pay | Source: Ambulatory Visit | Attending: Urology

## 2015-12-17 ENCOUNTER — Ambulatory Visit (HOSPITAL_COMMUNITY): Payer: Medicare Other

## 2015-12-17 DIAGNOSIS — Z87442 Personal history of urinary calculi: Secondary | ICD-10-CM | POA: Diagnosis not present

## 2015-12-17 DIAGNOSIS — E119 Type 2 diabetes mellitus without complications: Secondary | ICD-10-CM | POA: Diagnosis not present

## 2015-12-17 DIAGNOSIS — Z791 Long term (current) use of non-steroidal anti-inflammatories (NSAID): Secondary | ICD-10-CM | POA: Insufficient documentation

## 2015-12-17 DIAGNOSIS — N200 Calculus of kidney: Secondary | ICD-10-CM | POA: Insufficient documentation

## 2015-12-17 DIAGNOSIS — Z79899 Other long term (current) drug therapy: Secondary | ICD-10-CM | POA: Insufficient documentation

## 2015-12-17 DIAGNOSIS — I10 Essential (primary) hypertension: Secondary | ICD-10-CM | POA: Diagnosis not present

## 2015-12-17 DIAGNOSIS — E785 Hyperlipidemia, unspecified: Secondary | ICD-10-CM | POA: Diagnosis not present

## 2015-12-17 DIAGNOSIS — Z87891 Personal history of nicotine dependence: Secondary | ICD-10-CM | POA: Insufficient documentation

## 2015-12-17 DIAGNOSIS — N201 Calculus of ureter: Secondary | ICD-10-CM

## 2015-12-17 DIAGNOSIS — Z7982 Long term (current) use of aspirin: Secondary | ICD-10-CM | POA: Insufficient documentation

## 2015-12-17 DIAGNOSIS — R109 Unspecified abdominal pain: Secondary | ICD-10-CM | POA: Diagnosis present

## 2015-12-17 DIAGNOSIS — Z7952 Long term (current) use of systemic steroids: Secondary | ICD-10-CM | POA: Diagnosis not present

## 2015-12-17 DIAGNOSIS — Z7984 Long term (current) use of oral hypoglycemic drugs: Secondary | ICD-10-CM | POA: Insufficient documentation

## 2015-12-17 LAB — GLUCOSE, CAPILLARY
GLUCOSE-CAPILLARY: 90 mg/dL (ref 65–99)
Glucose-Capillary: 97 mg/dL (ref 65–99)

## 2015-12-17 SURGERY — LITHOTRIPSY, ESWL
Anesthesia: LOCAL | Laterality: Left

## 2015-12-17 MED ORDER — CIPROFLOXACIN HCL 500 MG PO TABS
500.0000 mg | ORAL_TABLET | ORAL | Status: AC
  Administered 2015-12-17: 500 mg via ORAL
  Filled 2015-12-17: qty 1

## 2015-12-17 MED ORDER — DIPHENHYDRAMINE HCL 25 MG PO CAPS
25.0000 mg | ORAL_CAPSULE | ORAL | Status: AC
  Administered 2015-12-17: 25 mg via ORAL
  Filled 2015-12-17: qty 1

## 2015-12-17 MED ORDER — DIAZEPAM 5 MG PO TABS
10.0000 mg | ORAL_TABLET | ORAL | Status: AC
  Administered 2015-12-17: 10 mg via ORAL
  Filled 2015-12-17: qty 2

## 2015-12-17 MED ORDER — SODIUM CHLORIDE 0.9 % IV SOLN
INTRAVENOUS | Status: DC
  Administered 2015-12-17: 09:00:00 via INTRAVENOUS

## 2015-12-17 NOTE — Discharge Instructions (Signed)
Lithotripsy, Care After °Refer to this sheet in the next few weeks. These instructions provide you with information on caring for yourself after your procedure. Your health care provider may also give you more specific instructions. Your treatment has been planned according to current medical practices, but problems sometimes occur. Call your health care provider if you have any problems or questions after your procedure. °WHAT TO EXPECT AFTER THE PROCEDURE  °· Your urine may have a red tinge for a few days after treatment. Blood loss is usually minimal. °· You may have soreness in the back or flank area. This usually goes away after a few days. The procedure can cause blotches or bruises on the back where the pressure wave enters the skin. These marks usually cause only minimal discomfort and should disappear in a short time. °· Stone fragments should begin to pass within 24 hours of treatment. However, a delayed passage is not unusual. °· You may have pain, discomfort, and feel sick to your stomach (nauseated) when the crushed fragments of stone are passed down the tube from the kidney to the bladder. Stone fragments can pass soon after the procedure and may last for up to 4-8 weeks. °· A small number of patients may have severe pain when stone fragments are not able to pass, which leads to an obstruction. °· If your stone is greater than 1 inch (2.5 cm) in diameter or if you have multiple stones that have a combined diameter greater than 1 inch (2.5 cm), you may require more than one treatment. °· If you had a stent placed prior to your procedure, you may experience some discomfort, especially during urination. You may experience the pain or discomfort in your flank or back, or you may experience a sharp pain or discomfort at the base of your penis or in your lower abdomen. The discomfort usually lasts only a few minutes after urinating. °HOME CARE INSTRUCTIONS  °· Rest at home until you feel your energy  improving. °· Only take over-the-counter or prescription medicines for pain, discomfort, or fever as directed by your health care provider. Depending on the type of lithotripsy, you may need to take antibiotics and anti-inflammatory medicines for a few days. °· Drink enough water and fluids to keep your urine clear or pale yellow. This helps "flush" your kidneys. It helps pass any remaining pieces of stone and prevents stones from coming back. °· Most people can resume daily activities within 1-2 days after standard lithotripsy. It can take longer to recover from laser and percutaneous lithotripsy. °· Strain all urine through the provided strainer. Keep all particulate matter and stones for your health care provider to see. The stone may be as small as a grain of salt. It is very important to use the strainer each and every time you pass your urine. Any stones that are found can be sent to a medical lab for examination. °· Visit your health care provider for a follow-up appointment in a few weeks. Your doctor may remove your stent if you have one. Your health care provider will also check to see whether stone particles still remain. °SEEK MEDICAL CARE IF:  °· Your pain is not relieved by medicine. °· You have a lasting nauseous feeling. °· You feel there is too much blood in the urine. °· You develop persistent problems with frequent or painful urination that does not at least partially improve after 2 days following the procedure. °· You have a congested cough. °· You feel   lightheaded. °· You develop a rash or any other signs that might suggest an allergic problem. °· You develop any reaction or side effects to your medicine(s). °SEEK IMMEDIATE MEDICAL CARE IF:  °· You experience severe back or flank pain or both. °· You see nothing but blood when you urinate. °· You cannot pass any urine at all. °· You have a fever or shaking chills. °· You develop shortness of breath, difficulty breathing, or chest pain. °· You  develop vomiting that will not stop after 6-8 hours. °· You have a fainting episode. °  °This information is not intended to replace advice given to you by your health care provider. Make sure you discuss any questions you have with your health care provider. °  °Document Released: 07/24/2007 Document Revised: 03/25/2015 Document Reviewed: 01/17/2013 °[employer]vier Interactive Patient Education ©2016 [employer] ° °

## 2015-12-17 NOTE — H&P (Signed)
Urology Admission H&P  Chief Complaint: left flank pain  History of Present Illness: Ms Nancy Glover is an 82yo with a hx of nephrolithiasis who presented to the ER on 5/9 with sepsis from an obstructing left ureteral calculus. She underwent Left ureteral stent placement. She was successfully treated with antibiotics and now presents for definitive treatment. She denies any pain currently  Past Medical History  Diagnosis Date  . Hypertension   . Diabetes mellitus, type 2 ([place])   . Ulcer of right leg ([place])   . Venous insufficiency, peripheral   . Macular degeneration of left eye   . PONV (postoperative nausea and vomiting)   . Hyperlipidemia   . PMR (polymyalgia rheumatica) ([place]) 2003   Past Surgical History  Procedure Laterality Date  . Cervical biopsy  w/ loop electrode excision  01-17-2001  DR LOMAX  . Cysto/ hod/ bladder bx  10-23-2006  DR PETERSON  . Cataract extraction w/ intraocular lens implant Right   . Esophagogastroduodenoscopy N/A 09/15/2012    Procedure: ESOPHAGOGASTRODUODENOSCOPY (EGD);  Surgeon: Winfield Cunas., MD;  Location: [facility];  Service: Endoscopy;  Laterality: N/A;  . Tonsillectomy    . Eye surgery      rt cataract  . Fracture surgery  1975    lt elbow-fx  . Skin split graft Left 04/19/2013    Procedure: SKIN GRAFT FULL THICKNESS TO LEFT LEG FROM ABDOMEN;  Surgeon: Pedro Earls, MD;  Location: [place] [facility];  Service: General;  Laterality: Left;  . Cystoscopy with stent placement Left 11/24/2015    Procedure: CYSTOSCOPY WITH STENT PLACEMENT left ureter left retrograde pylegram;  Surgeon: Ardis Hughs, MD;  Location: [facility];  Service: Urology;  Laterality: Left;    Home Medications:  Prescriptions prior to admission  Medication Sig Dispense Refill Last Dose  . amLODipine (NORVASC) 5 MG tablet Take 5 mg by mouth every morning.   12/17/2015 at 0545  . atorvastatin (LIPITOR) 20 MG tablet Take 20 mg by mouth daily.   12/16/2015 at 1430  .  Calcium Carbonate-Vitamin D (CALCIUM + D PO) Take 600 mg by mouth daily.   12/16/2015 at 1430  . furosemide (LASIX) 20 MG tablet Take 20 mg by mouth daily as needed (leg swelling).    Past Month at Unknown time  . losartan (COZAAR) 100 MG tablet Take 100 mg by mouth every morning.   12/17/2015 at 0545  . metFORMIN (GLUCOPHAGE) 1000 MG tablet Take 1,000 mg by mouth 2 (two) times daily with a meal.   12/17/2015 at 0545  . potassium chloride (K-DUR) 10 MEQ tablet Take 10 mEq by mouth daily.   12/16/2015 at 1430  . predniSONE (DELTASONE) 1 MG tablet Take 1 mg by mouth every morning. Take with 5mg  tablet to make 6mg    12/16/2015 at 1430  . predniSONE (DELTASONE) 5 MG tablet Take 5 mg by mouth every morning. Take with 1mg  tablet to make 6mg    12/16/2015 at 1430  . repaglinide (PRANDIN) 1 MG tablet TAKE 1 TABLET BEFORE BREAKFAST AND LUNCH AND TAKE 2 TABLETS BEFORE SUPPER 30 tablet 0 12/16/2015 at 1430  . trimethoprim (TRIMPEX) 100 MG tablet Take 100 mg by mouth daily as needed (for irritation (uti) take for abour 3-4 days).    Past Month at Unknown time  . VITAMIN B1-B12 IM Inject 1,000 mcg/mL into the muscle every 30 (thirty) days.   Past Month at Unknown time  . acetaminophen (TYLENOL) 500 MG tablet Take 1,000 mg by  mouth every 6 (six) hours as needed (leg pain).   More than a month at Unknown time  . amitriptyline (ELAVIL) 25 MG tablet Take 25 mg by mouth daily.    11/22/2015 at Unknown time  . aspirin 325 MG tablet Take 325 mg by mouth every 6 (six) hours as needed for moderate pain or headache.   unknown  . aspirin EC 81 MG tablet Take 81 mg by mouth daily.   12/10/2015  . cefUROXime (CEFTIN) 500 MG tablet Take 1 tablet (500 mg total) by mouth 2 (two) times daily with a meal. 14 tablet 0   . doxycycline (VIBRAMYCIN) 100 MG capsule Take 1 capsule (100 mg total) by mouth 2 (two) times daily. 14 capsule 0   . glucose blood (ACCU-CHEK AVIVA PLUS) test strip Check upto 2x daily 100 each 3 11/22/2015 at Unknown time  .  ibuprofen (ADVIL,MOTRIN) 200 MG tablet Take 200-400 mg by mouth every 6 (six) hours as needed for headache.   More than a month at Unknown time  . omeprazole (PRILOSEC OTC) 20 MG tablet Take 20 mg by mouth every morning.    11/22/2015 at Unknown time   Allergies:  Allergies  Allergen Reactions  . Actos [Pioglitazone] Hives and Swelling    Body swelling  . Sulfa Antibiotics Swelling    Tongue and mouth swelling    Family History  Problem Relation Age of Onset  . Colon cancer Father   . Heart disease Father   . Hypertension Father   . Heart attack Father   . Cancer Mother   . Heart disease Mother   . Diabetes Neg Hx    Social History:  reports that she quit smoking about 24 years ago. Her smoking use included Cigarettes. She has a 40 pack-year smoking history. She has never used smokeless tobacco. She reports that she does not drink alcohol or use illicit drugs.  Review of Systems  Gastrointestinal: Positive for nausea.  Genitourinary: Positive for hematuria.  Neurological: Positive for weakness.  All other systems reviewed and are negative.   Physical Exam:  Vital signs in last 24 hours: Temp:  [97.7 F (36.5 C)] 97.7 F (36.5 C) (06/01 0803) Pulse Rate:  [82] 82 (06/01 0803) Resp:  [20] 20 (06/01 0803) BP: (174-189)/(61-67) 174/63 mmHg (06/01 0929) SpO2:  [100 %] 100 % (06/01 0803) Weight:  [69.582 kg (153 lb 6.4 oz)] 69.582 kg (153 lb 6.4 oz) (06/01 0803) Physical Exam  Constitutional: She is oriented to person, place, and time. She appears well-developed and well-nourished.  HENT:  Head: Normocephalic and atraumatic.  Eyes: EOM are normal. Pupils are equal, round, and reactive to light.  Neck: Normal range of motion. No thyromegaly present.  Cardiovascular: Normal rate and regular rhythm.   Respiratory: Effort normal. No respiratory distress.  GI: Soft. She exhibits no distension.  Musculoskeletal: Normal range of motion.  Neurological: She is alert and oriented to  person, place, and time.  Skin: Skin is warm and dry.  Psychiatric: She has a normal mood and affect. Her behavior is normal. Judgment and thought content normal.    Laboratory Data:  Results for orders placed or performed during the hospital encounter of 12/17/15 (from the past 24 hour(s))  Glucose, capillary     Status: None   Collection Time: 12/17/15  8:10 AM  Result Value Ref Range   Glucose-Capillary 97 65 - 99 mg/dL   No results found for this or any previous visit (from the past 240  hour(s)). Creatinine: No results for input(s): CREATININE in the last 168 hours. Baseline Creatinine: unknown  Impression/Assessment:  82yo with left renal calculi  Plan:  The risks/benefits/alternatives to ESWL was explained to the patient and they understand and wish to proceed with surgery  Nicolette Bang 12/17/2015, 11:38 AM

## 2016-05-24 ENCOUNTER — Encounter: Payer: Self-pay | Admitting: Hematology

## 2016-07-21 ENCOUNTER — Ambulatory Visit (HOSPITAL_BASED_OUTPATIENT_CLINIC_OR_DEPARTMENT_OTHER): Payer: Medicare Other | Admitting: Hematology

## 2016-07-21 ENCOUNTER — Ambulatory Visit (HOSPITAL_BASED_OUTPATIENT_CLINIC_OR_DEPARTMENT_OTHER): Payer: Medicare Other

## 2016-07-21 ENCOUNTER — Encounter: Payer: Self-pay | Admitting: Hematology

## 2016-07-21 ENCOUNTER — Telehealth: Payer: Self-pay | Admitting: Hematology

## 2016-07-21 VITALS — BP 164/54 | HR 89 | Temp 98.2°F | Resp 16 | Wt 153.1 lb

## 2016-07-21 DIAGNOSIS — Z809 Family history of malignant neoplasm, unspecified: Secondary | ICD-10-CM

## 2016-07-21 DIAGNOSIS — Z800 Family history of malignant neoplasm of digestive organs: Secondary | ICD-10-CM

## 2016-07-21 DIAGNOSIS — E538 Deficiency of other specified B group vitamins: Secondary | ICD-10-CM

## 2016-07-21 DIAGNOSIS — G8929 Other chronic pain: Secondary | ICD-10-CM

## 2016-07-21 DIAGNOSIS — Z87891 Personal history of nicotine dependence: Secondary | ICD-10-CM

## 2016-07-21 DIAGNOSIS — D509 Iron deficiency anemia, unspecified: Secondary | ICD-10-CM

## 2016-07-21 DIAGNOSIS — D519 Vitamin B12 deficiency anemia, unspecified: Secondary | ICD-10-CM

## 2016-07-21 DIAGNOSIS — D649 Anemia, unspecified: Secondary | ICD-10-CM

## 2016-07-21 DIAGNOSIS — I10 Essential (primary) hypertension: Secondary | ICD-10-CM | POA: Diagnosis not present

## 2016-07-21 DIAGNOSIS — E1165 Type 2 diabetes mellitus with hyperglycemia: Secondary | ICD-10-CM

## 2016-07-21 DIAGNOSIS — M79605 Pain in left leg: Secondary | ICD-10-CM

## 2016-07-21 LAB — CBC & DIFF AND RETIC
BASO%: 0.1 % (ref 0.0–2.0)
BASOS ABS: 0 10*3/uL (ref 0.0–0.1)
EOS ABS: 0 10*3/uL (ref 0.0–0.5)
EOS%: 0.1 % (ref 0.0–7.0)
HCT: 34 % — ABNORMAL LOW (ref 34.8–46.6)
HEMOGLOBIN: 10.9 g/dL — AB (ref 11.6–15.9)
IMMATURE RETIC FRACT: 13.8 % — AB (ref 1.60–10.00)
LYMPH%: 15 % (ref 14.0–49.7)
MCH: 24.2 pg — ABNORMAL LOW (ref 25.1–34.0)
MCHC: 32.1 g/dL (ref 31.5–36.0)
MCV: 75.4 fL — AB (ref 79.5–101.0)
MONO#: 0.3 10*3/uL (ref 0.1–0.9)
MONO%: 3 % (ref 0.0–14.0)
NEUT#: 7.2 10*3/uL — ABNORMAL HIGH (ref 1.5–6.5)
NEUT%: 81.8 % — ABNORMAL HIGH (ref 38.4–76.8)
NRBC: 0 % (ref 0–0)
Platelets: 238 10*3/uL (ref 145–400)
RBC: 4.51 10*6/uL (ref 3.70–5.45)
RDW: 17.9 % — AB (ref 11.2–14.5)
Retic %: 1.61 % (ref 0.70–2.10)
Retic Ct Abs: 72.61 10*3/uL (ref 33.70–90.70)
WBC: 8.8 10*3/uL (ref 3.9–10.3)
lymph#: 1.3 10*3/uL (ref 0.9–3.3)

## 2016-07-21 LAB — COMPREHENSIVE METABOLIC PANEL
ALT: 13 U/L (ref 0–55)
ANION GAP: 14 meq/L — AB (ref 3–11)
AST: 11 U/L (ref 5–34)
Albumin: 3.4 g/dL — ABNORMAL LOW (ref 3.5–5.0)
Alkaline Phosphatase: 111 U/L (ref 40–150)
BUN: 11.7 mg/dL (ref 7.0–26.0)
CHLORIDE: 105 meq/L (ref 98–109)
CO2: 20 meq/L — AB (ref 22–29)
CREATININE: 0.9 mg/dL (ref 0.6–1.1)
Calcium: 9.3 mg/dL (ref 8.4–10.4)
EGFR: 60 mL/min/{1.73_m2} — AB (ref >90)
Glucose: 453 mg/dl — ABNORMAL HIGH (ref 70–140)
POTASSIUM: 3.8 meq/L (ref 3.5–5.1)
Sodium: 139 mEq/L (ref 136–145)
Total Bilirubin: 0.77 mg/dL (ref 0.20–1.20)
Total Protein: 6.3 g/dL — ABNORMAL LOW (ref 6.4–8.3)

## 2016-07-21 LAB — MORPHOLOGY: PLT EST: ADEQUATE

## 2016-07-21 NOTE — Telephone Encounter (Signed)
Appointments scheduled per 1/4 LOS. Patient given AVS report and calendars with future scheduled appointments. °

## 2016-07-21 NOTE — Progress Notes (Signed)
[facility]  [phone] [phone]  Clinic New consult Note   Patient Care Team: [name], MD as PCP - General (Family Medicine) 07/21/2016  Referring physician: [name], MD  CHIEF COMPLAINTS/PURPOSE OF CONSULTATION:  Iron deficient anemia  HISTORY OF PRESENTING ILLNESS:  Nancy Glover 83 y.o. female is here because of her chronic anemia, from iron deficiency and B12 deficiency. She was accompanied by her husband to clinic today. She was referred by her primary care physician Dr. Tamala Julian  She was found to have abnormal CBC from at least a few years. She was hospitalized in [place] 2014 for severe anemia with hemoglobin 6.8, received a blood transfusion. Anemia workup showed low ferritin, serum iron, and low B12 levels. Stool. Was positive. She underwent EGD by Dr. Oletta Lamas, which was negative. She does not remember if she had colonoscopy on not afterwards. She followed with her primary care physician afterwards, has had a few course of oral iron, but did not tolerate well and is stopped. Over the past few years, she also received IV iron a few times. Her hemoglobin has been in the range 8-10.5g/dl.   She had left leg injury from falls about 3 years ago, and required surgery. She has had a chronic left leg pain and a swollen since then. She is not physically active due to the pain. She walks with a cane.   She has moderate fatigue.She denies recent chest pain on exertion, shortness of breath on minimal exertion, pre-syncopal episodes, or palpitations. She had not noticed any recent bleeding such as epistaxis, hematuria or hematochezia The patient takes regular dose aspirin daily for her leg pain.  She had no prior history or diagnosis of cancer. Her age appropriate screening programs are up-to-date. She denies any pica and eats a variety of diet. She never donated blood or received blood transfusion The patient was prescribed oral iron  supplements but only took 2-3 months due to poor tolerance.   Patient has poorly controlled diabetes and hypertension. She does not care about her diet at all, drinks quite a bit soft drinks, and eats candies, she spends most time in chair watching TV during the day. Her husband encourages her to eat healthy and exercise, but she is not motivated to do so.  MEDICAL HISTORY:  Past Medical History:  Diagnosis Date  . Diabetes mellitus, type 2 ([place])   . Hyperlipidemia   . Hypertension   . Macular degeneration of left eye   . PMR (polymyalgia rheumatica) ([place]) 2003  . PONV (postoperative nausea and vomiting)   . Ulcer of right leg ([place])   . Venous insufficiency, peripheral     SURGICAL HISTORY: Past Surgical History:  Procedure Laterality Date  . CATARACT EXTRACTION W/ INTRAOCULAR LENS IMPLANT Right   . CERVICAL BIOPSY  W/ LOOP ELECTRODE EXCISION  01-17-2001  DR LOMAX  . CYSTO/ HOD/ BLADDER BX  10-23-2006  DR PETERSON  . CYSTOSCOPY WITH STENT PLACEMENT Left 11/24/2015   Procedure: CYSTOSCOPY WITH STENT PLACEMENT left ureter left retrograde pylegram;  Surgeon: Ardis Hughs, MD;  Location: [facility];  Service: Urology;  Laterality: Left;  . ESOPHAGOGASTRODUODENOSCOPY N/A 09/15/2012   Procedure: ESOPHAGOGASTRODUODENOSCOPY (EGD);  Surgeon: Winfield Cunas., MD;  Location: [facility];  Service: Endoscopy;  Laterality: N/A;  . EYE SURGERY     rt cataract  . FRACTURE SURGERY  1975   lt elbow-fx  . SKIN SPLIT GRAFT Left 04/19/2013   Procedure: SKIN GRAFT FULL THICKNESS  TO LEFT LEG FROM ABDOMEN;  Surgeon: Pedro Earls, MD;  Location: [facility];  Service: General;  Laterality: Left;  . TONSILLECTOMY      SOCIAL HISTORY: Social History   Social History  . Marital status: Married    Spouse name: N/A  . Number of children: N/A  . Years of education: N/A   Occupational History  . Not on file.   Social History Main Topics  . Smoking status: Former Smoker     Packs/day: 1.00    Years: 40.00    Types: Cigarettes    Quit date: 09/13/1991  . Smokeless tobacco: Never Used  . Alcohol use No  . Drug use: No  . Sexual activity: No   Other Topics Concern  . Not on file   Social History Narrative  . No narrative on file    FAMILY HISTORY: Family History  Problem Relation Age of Onset  . Colon cancer Father   . Heart disease Father   . Hypertension Father   . Heart attack Father   . Cancer Father     unknown type cancer   . Heart disease Mother   . Anemia Mother   . Diabetes Neg Hx     ALLERGIES:  is allergic to actos [pioglitazone] and sulfa antibiotics.  MEDICATIONS:  Current Outpatient Prescriptions  Medication Sig Dispense Refill  . acetaminophen (TYLENOL) 500 MG tablet Take 1,000 mg by mouth every 6 (six) hours as needed (leg pain).    Marland Kitchen amLODipine (NORVASC) 5 MG tablet Take 10 mg by mouth daily.     Marland Kitchen aspirin EC 81 MG tablet Take 81 mg by mouth daily.    Marland Kitchen atorvastatin (LIPITOR) 20 MG tablet Take 20 mg by mouth daily.    . Calcium Carbonate-Vitamin D (CALCIUM + D PO) Take 600 mg by mouth daily.    . furosemide (LASIX) 20 MG tablet Take 20 mg by mouth daily as needed (leg swelling).     Marland Kitchen glucose blood (ACCU-CHEK AVIVA PLUS) test strip Check upto 2x daily 100 each 3  . ibuprofen (ADVIL,MOTRIN) 200 MG tablet Take 200-400 mg by mouth every 6 (six) hours as needed for headache.    . losartan (COZAAR) 100 MG tablet Take 100 mg by mouth every morning.    . metFORMIN (GLUCOPHAGE) 1000 MG tablet Take 1,000 mg by mouth 2 (two) times daily with a meal.    . omeprazole (PRILOSEC OTC) 20 MG tablet Take 20 mg by mouth every morning.     . polyethylene glycol (MIRALAX / GLYCOLAX) packet Take 17 g by mouth daily as needed.    . potassium chloride (K-DUR) 10 MEQ tablet Take 10 mEq by mouth daily.    . predniSONE (DELTASONE) 1 MG tablet Take 1 mg by mouth every morning. Take with 96m tablet to make 636m   . predniSONE (DELTASONE) 5 MG tablet  Take 5 mg by mouth every morning. Take with 45m87mablet to make 6mg60m . VITAMIN B1-B12 IM Inject 1,000 mcg/mL into the muscle every 30 (thirty) days.    . repaglinide (PRANDIN) 1 MG tablet TAKE 1 TABLET BEFORE BREAKFAST AND LUNCH AND TAKE 2 TABLETS BEFORE SUPPER (Patient not taking: Reported on 07/21/2016) 30 tablet 0  . trimethoprim (TRIMPEX) 100 MG tablet Take 100 mg by mouth daily as needed (for irritation (uti) take for abour 3-4 days).      No current facility-administered medications for this visit.  REVIEW OF SYSTEMS:   Constitutional: Denies fevers, chills or abnormal night sweats Eyes: Denies blurriness of vision, double vision or watery eyes Ears, nose, mouth, throat, and face: Denies mucositis or sore throat Respiratory: Denies cough, dyspnea or wheezes Cardiovascular: Denies palpitation, chest discomfort or lower extremity swelling Gastrointestinal:  Denies nausea, heartburn or change in bowel habits Skin: Denies abnormal skin rashes Lymphatics: Denies new lymphadenopathy or easy bruising Neurological:Denies numbness, tingling or new weaknesses Behavioral/Psych: Mood is stable, no new changes  All other systems were reviewed with the patient and are negative.  PHYSICAL EXAMINATION: ECOG PERFORMANCE STATUS: 3 - Symptomatic, >50% confined to bed  Vitals:   07/21/16 1430  BP: (!) 164/54  Pulse: 89  Resp: 16  Temp: 98.2 F (36.8 C)   Filed Weights   07/21/16 1430  Weight: 153 lb 1.6 oz (69.4 kg)    GENERAL:alert, no distress and comfortable SKIN: skin color, texture, turgor are normal, no rashes or significant lesions EYES: normal, conjunctiva are pink and non-injected, sclera clear OROPHARYNX:no exudate, no erythema and lips, buccal mucosa, and tongue normal  NECK: supple, thyroid normal size, non-tender, without nodularity LYMPH:  no palpable lymphadenopathy in the cervical, axillary or inguinal LUNGS: clear to auscultation and percussion with normal breathing  effort HEART: regular rate & rhythm and no murmurs and no lower extremity edema ABDOMEN:abdomen soft, non-tender and normal bowel sounds Musculoskeletal:no cyanosis of digits and no clubbing  PSYCH: alert & oriented x 3 with fluent speech NEURO: no focal motor/sensory deficits EXT: Bilateral trace edema of lower extremity, up to below knee  LABORATORY DATA:  I have reviewed the data as listed CBC Latest Ref Rng & Units 07/21/2016 11/27/2015 11/26/2015  WBC 3.9 - 10.3 10e3/uL 8.8 9.8 9.5  Hemoglobin 11.6 - 15.9 g/dL 10.9(L) 8.5(L) 7.8(L)  Hematocrit 34.8 - 46.6 % 34.0(L) 26.9(L) 25.0(L)  Platelets 145 - 400 10e3/uL 238 205 198    CMP Latest Ref Rng & Units 11/27/2015 11/26/2015 11/25/2015  Glucose 65 - 99 mg/dL 116(H) 109(H) 132(H)  BUN 6 - 20 mg/dL '13 19 19  ' Creatinine 0.44 - 1.00 mg/dL 0.64 0.69 0.80  Sodium 135 - 145 mmol/L 143 144 143  Potassium 3.5 - 5.1 mmol/L 3.5 3.2(L) 3.6  Chloride 101 - 111 mmol/L 110 109 108  CO2 22 - 32 mmol/L '24 24 25  ' Calcium 8.9 - 10.3 mg/dL 8.1(L) 7.7(L) 7.9(L)  Total Protein 6.5 - 8.1 g/dL - - -  Total Bilirubin 0.3 - 1.2 mg/dL - - -  Alkaline Phos 38 - 126 U/L - - -  AST 15 - 41 U/L - - -  ALT 14 - 54 U/L - - -    Results for JENIE, PARISH (MRN [id number]) as of 07/21/2016 07:04  Ref. Range 09/14/2012 13:22  Iron Latest Ref Range: 42 - 135 ug/dL <10 (L)  UIBC Latest Ref Range: 125 - 400 ug/dL 334  TIBC Latest Ref Range: 250 - 470 ug/dL Not calculated du...  Saturation Ratios Latest Ref Range: 20 - 55 % Not calculated du...  Ferritin Latest Ref Range: 10 - 291 ng/mL 7 (L)  Folate Latest Units: ng/mL 13.9   Vitamin B12  Order: [id number]  Status:  Edited Result - FINAL  Visible to patient:  Yes (MyChart)  Next appt:  Today at 02:30 PM in [facility], Krista Blue, MD)   Ref Range & Units 15yrago  Vitamin B-12 211 - 911 pg/mL 208  RADIOGRAPHIC STUDIES: I have personally reviewed the radiological images as listed and agreed with the  findings in the report. No results found.  ASSESSMENT & PLAN:  83 year old Caucasian female, with past medical history of poorly controlled diabetes, hypertension, chronic anemia, presnts to my clinic for her anemia management.   1. Microcytic anemia, secondary to iron deficiency and B12 deficiency -Her prior anemia workup showed low ferritin, low serum iron and transferrin saturation, this is consistent with anemia of iron deficiency. She also has a low B12 level, which contributes to her anemia. -She has family history of anemia in her mother, however her MCV was normal in 2014, this is unlikely thalassemia or other inheritable anemia. -She may have component of anemia of chronic disease due to her poorly controlled diabetes.  -I'll obtain SPEP with immunofixation to ruled out multiple myeloma. -We discussed other etiology of anemia, such as bone marrow disease. She has normal WBC and platelet count, my suspicion for MDS in ulcer primary bone marrow disease is not very high, I do not think she needs bone marrow biopsy at this point. -We discussed the year etiology of iron deficiency. I suspect that this is likely slow GI bleeding, possible related to her long-term use of aspirin. She previously had EGD, but I don't know if she had colonoscopy. I'll contact Dr. Oletta Lamas to see if she had colonoscopy before, if not, I would recommend she undergo colonoscopy. -I'll repeat her CBC and iron studies today. If she has persistent iron deficiency, I'll set up IV Feraheme weekly for 2 doses. She previously tried oral iron, could not tolerate.  2. DM, HTN, arthralgia, chronic left leg pain -She will follow-up with her family care physician Dr. Tamala Julian -I strongly encouraged her watch your diet, avoid soft drinks and sweets, and try to exercise  Plan  -lab today, and every 4 weeks -I'll set up IV Feraheme if she has ferritin less than 100 or low serum iron level -f/u in 3 months   All questions were  answered. The patient knows to call the clinic with any problems, questions or concerns. I spent 40 minutes counseling the patient face to face. The total time spent in the appointment was 4r minutes and more than 50% was on counseling.     Truitt Merle, MD 07/21/16 4:46 PM

## 2016-07-22 ENCOUNTER — Telehealth: Payer: Self-pay | Admitting: *Deleted

## 2016-07-22 LAB — FOLATE RBC
FOLATE, RBC: 989 ng/mL (ref >498)
Folate, Hemolysate: 343.1 ng/mL
Hematocrit: 34.7 % (ref 34.0–46.6)

## 2016-07-22 LAB — FERRITIN: FERRITIN: 12 ng/mL (ref 9–269)

## 2016-07-22 LAB — KAPPA/LAMBDA LIGHT CHAINS
IG LAMBDA FREE LIGHT CHAIN: 24.4 mg/L (ref 5.7–26.3)
Ig Kappa Free Light Chain: 17.8 mg/L (ref 3.3–19.4)
KAPPA/LAMBDA FLC RATIO: 0.73 (ref 0.26–1.65)

## 2016-07-22 LAB — VITAMIN B12: Vitamin B12: 539 pg/mL (ref 232–1245)

## 2016-07-22 LAB — IRON AND TIBC
%SAT: 6 % — ABNORMAL LOW (ref 21–57)
Iron: 20 ug/dL — ABNORMAL LOW (ref 41–142)
TIBC: 331 ug/dL (ref 236–444)
UIBC: 311 ug/dL (ref 120–384)

## 2016-07-22 LAB — LACTATE DEHYDROGENASE: LDH: 179 U/L (ref 125–245)

## 2016-07-22 NOTE — Telephone Encounter (Signed)
Called pt & informed of need for IV iron & discussed elevated blood glucose & suggested she check her blood sugar tonight before dinner & again in am before breakfast & cont to check over weekend & to notify Dr Smith/PCP of results on Monday. Suggested she avoid starchy foods & sweets.  She expressed understanding & informed that scheduler should call her next week for appts.  Message to scheduler.

## 2016-07-22 NOTE — Telephone Encounter (Signed)
-----   Message from Truitt Merle, MD sent at 07/22/2016 11:22 AM EST ----- Please call pt about her lab results from yesterday. She has low iron, did not tolerate oral iron, please set up iv feraheme weekly X2 in the next few weeks. Inform her that her blood glucose is very high, and she needs to watch her diet and needs to see her PCP Dr. Tamala Julian.   Truitt Merle  07/22/2016

## 2016-07-24 LAB — METHYLMALONIC ACID, SERUM: Methylmalonic Acid, Serum: 95 nmol/L (ref 0–378)

## 2016-07-25 LAB — PROTEIN ELECTROPHORESIS, SERUM, WITH REFLEX
A/G Ratio: 1.1 (ref 0.7–1.7)
ALPHA 2: 0.8 g/dL (ref 0.4–1.0)
Albumin: 3.2 g/dL (ref 2.9–4.4)
Alpha 1: 0.2 g/dL (ref 0.0–0.4)
BETA: 0.9 g/dL (ref 0.7–1.3)
GLOBULIN, TOTAL: 2.8 g/dL (ref 2.2–3.9)
Gamma Globulin: 0.8 g/dL (ref 0.4–1.8)
Total Protein: 6 g/dL (ref 6.0–8.5)

## 2016-08-01 ENCOUNTER — Telehealth: Payer: Self-pay | Admitting: Hematology

## 2016-08-01 NOTE — Telephone Encounter (Signed)
sw pt to confirm infusion appts 1/25 and 2/1 per LOS

## 2016-08-10 ENCOUNTER — Telehealth: Payer: Self-pay | Admitting: Hematology

## 2016-08-10 ENCOUNTER — Other Ambulatory Visit: Payer: Self-pay | Admitting: Hematology

## 2016-08-11 ENCOUNTER — Ambulatory Visit (HOSPITAL_BASED_OUTPATIENT_CLINIC_OR_DEPARTMENT_OTHER): Payer: Medicare Other

## 2016-08-11 VITALS — BP 177/67 | HR 81 | Temp 98.6°F | Resp 18

## 2016-08-11 DIAGNOSIS — D509 Iron deficiency anemia, unspecified: Secondary | ICD-10-CM | POA: Diagnosis not present

## 2016-08-11 MED ORDER — SODIUM CHLORIDE 0.9 % IV SOLN
510.0000 mg | Freq: Once | INTRAVENOUS | Status: AC
  Administered 2016-08-11: 510 mg via INTRAVENOUS
  Filled 2016-08-11: qty 17

## 2016-08-11 MED ORDER — SODIUM CHLORIDE 0.9 % IV SOLN
Freq: Once | INTRAVENOUS | Status: AC
  Administered 2016-08-11: 11:00:00 via INTRAVENOUS

## 2016-08-11 NOTE — Patient Instructions (Signed)
Ferumoxytol injection What is this medicine? FERUMOXYTOL is an iron complex. Iron is used to make healthy red blood cells, which carry oxygen and nutrients throughout the body. This medicine is used to treat iron deficiency anemia in people with chronic kidney disease. COMMON BRAND NAME(S): Feraheme What should I tell my health care provider before I take this medicine? They need to know if you have any of these conditions: -anemia not caused by low iron levels -high levels of iron in the blood -magnetic resonance imaging (MRI) test scheduled -an unusual or allergic reaction to iron, other medicines, foods, dyes, or preservatives -pregnant or trying to get pregnant -breast-feeding How should I use this medicine? This medicine is for injection into a vein. It is given by a health care professional in a hospital or clinic setting. Talk to your pediatrician regarding the use of this medicine in children. Special care may be needed. What if I miss a dose? It is important not to miss your dose. Call your doctor or health care professional if you are unable to keep an appointment. What may interact with this medicine? This medicine may interact with the following medications: -other iron products What should I watch for while using this medicine? Visit your doctor or healthcare professional regularly. Tell your doctor or healthcare professional if your symptoms do not start to get better or if they get worse. You may need blood work done while you are taking this medicine. You may need to follow a special diet. Talk to your doctor. Foods that contain iron include: whole grains/cereals, dried fruits, beans, or peas, leafy green vegetables, and organ meats (liver, kidney). What side effects may I notice from receiving this medicine? Side effects that you should report to your doctor or health care professional as soon as possible: -allergic reactions like skin rash, itching or hives, swelling of the  face, lips, or tongue -breathing problems -changes in blood pressure -feeling faint or lightheaded, falls -fever or chills -flushing, sweating, or hot feelings -swelling of the ankles or feet Side effects that usually do not require medical attention (report to your doctor or health care professional if they continue or are bothersome): -diarrhea -headache -nausea, vomiting -stomach pain Where should I keep my medicine? This drug is given in a hospital or clinic and will not be stored at home.  2017 Elsevier/Gold Standard (2015-08-06 12:41:49)  

## 2016-08-18 ENCOUNTER — Other Ambulatory Visit (HOSPITAL_BASED_OUTPATIENT_CLINIC_OR_DEPARTMENT_OTHER): Payer: Medicare Other

## 2016-08-18 ENCOUNTER — Ambulatory Visit (HOSPITAL_BASED_OUTPATIENT_CLINIC_OR_DEPARTMENT_OTHER): Payer: Medicare Other

## 2016-08-18 VITALS — BP 128/53 | HR 81 | Temp 98.4°F | Resp 18

## 2016-08-18 DIAGNOSIS — D509 Iron deficiency anemia, unspecified: Secondary | ICD-10-CM

## 2016-08-18 LAB — IRON AND TIBC
%SAT: 27 % (ref 21–57)
Iron: 73 ug/dL (ref 41–142)
TIBC: 275 ug/dL (ref 236–444)
UIBC: 202 ug/dL (ref 120–384)

## 2016-08-18 LAB — CBC & DIFF AND RETIC
BASO%: 0.2 % (ref 0.0–2.0)
Basophils Absolute: 0 10*3/uL (ref 0.0–0.1)
EOS%: 1.4 % (ref 0.0–7.0)
Eosinophils Absolute: 0.1 10*3/uL (ref 0.0–0.5)
HCT: 33.2 % — ABNORMAL LOW (ref 34.8–46.6)
HGB: 11.3 g/dL — ABNORMAL LOW (ref 11.6–15.9)
IMMATURE RETIC FRACT: 14.2 % — AB (ref 1.60–10.00)
LYMPH%: 16.1 % (ref 14.0–49.7)
MCH: 24.9 pg — ABNORMAL LOW (ref 25.1–34.0)
MCHC: 34 g/dL (ref 31.5–36.0)
MCV: 73.3 fL — AB (ref 79.5–101.0)
MONO#: 0.8 10*3/uL (ref 0.1–0.9)
MONO%: 8.2 % (ref 0.0–14.0)
NEUT#: 7.6 10*3/uL — ABNORMAL HIGH (ref 1.5–6.5)
NEUT%: 74.1 % (ref 38.4–76.8)
PLATELETS: 214 10*3/uL (ref 145–400)
RBC: 4.53 10*6/uL (ref 3.70–5.45)
RDW: 19.5 % — ABNORMAL HIGH (ref 11.2–14.5)
Retic %: 2.47 % — ABNORMAL HIGH (ref 0.70–2.10)
Retic Ct Abs: 111.89 10*3/uL — ABNORMAL HIGH (ref 33.70–90.70)
WBC: 10.3 10*3/uL (ref 3.9–10.3)
lymph#: 1.7 10*3/uL (ref 0.9–3.3)

## 2016-08-18 LAB — FERRITIN: Ferritin: 686 ng/ml — ABNORMAL HIGH (ref 9–269)

## 2016-08-18 MED ORDER — SODIUM CHLORIDE 0.9 % IV SOLN
Freq: Once | INTRAVENOUS | Status: AC
  Administered 2016-08-18: 15:00:00 via INTRAVENOUS

## 2016-08-18 MED ORDER — SODIUM CHLORIDE 0.9 % IV SOLN
510.0000 mg | Freq: Once | INTRAVENOUS | Status: AC
  Administered 2016-08-18: 510 mg via INTRAVENOUS
  Filled 2016-08-18: qty 17

## 2016-08-18 NOTE — Patient Instructions (Signed)
Ferumoxytol injection What is this medicine? FERUMOXYTOL is an iron complex. Iron is used to make healthy red blood cells, which carry oxygen and nutrients throughout the body. This medicine is used to treat iron deficiency anemia in people with chronic kidney disease. COMMON BRAND NAME(S): Feraheme What should I tell my health care provider before I take this medicine? They need to know if you have any of these conditions: -anemia not caused by low iron levels -high levels of iron in the blood -magnetic resonance imaging (MRI) test scheduled -an unusual or allergic reaction to iron, other medicines, foods, dyes, or preservatives -pregnant or trying to get pregnant -breast-feeding How should I use this medicine? This medicine is for injection into a vein. It is given by a health care professional in a hospital or clinic setting. Talk to your pediatrician regarding the use of this medicine in children. Special care may be needed. What if I miss a dose? It is important not to miss your dose. Call your doctor or health care professional if you are unable to keep an appointment. What may interact with this medicine? This medicine may interact with the following medications: -other iron products What should I watch for while using this medicine? Visit your doctor or healthcare professional regularly. Tell your doctor or healthcare professional if your symptoms do not start to get better or if they get worse. You may need blood work done while you are taking this medicine. You may need to follow a special diet. Talk to your doctor. Foods that contain iron include: whole grains/cereals, dried fruits, beans, or peas, leafy green vegetables, and organ meats (liver, kidney). What side effects may I notice from receiving this medicine? Side effects that you should report to your doctor or health care professional as soon as possible: -allergic reactions like skin rash, itching or hives, swelling of the  face, lips, or tongue -breathing problems -changes in blood pressure -feeling faint or lightheaded, falls -fever or chills -flushing, sweating, or hot feelings -swelling of the ankles or feet Side effects that usually do not require medical attention (report to your doctor or health care professional if they continue or are bothersome): -diarrhea -headache -nausea, vomiting -stomach pain Where should I keep my medicine? This drug is given in a hospital or clinic and will not be stored at home.  2017 Elsevier/Gold Standard (2015-08-06 12:41:49)  

## 2016-08-21 ENCOUNTER — Emergency Department (HOSPITAL_COMMUNITY)
Admission: EM | Admit: 2016-08-21 | Discharge: 2016-08-21 | Disposition: A | Discharge status: Home / Self Care | Payer: Medicare Other | Attending: Emergency Medicine | Admitting: Emergency Medicine

## 2016-08-21 ENCOUNTER — Encounter (HOSPITAL_COMMUNITY): Payer: Self-pay | Admitting: Emergency Medicine

## 2016-08-21 ENCOUNTER — Emergency Department (HOSPITAL_BASED_OUTPATIENT_CLINIC_OR_DEPARTMENT_OTHER)
Admit: 2016-08-21 | Discharge: 2016-08-21 | Disposition: A | Discharge status: Home / Self Care | Payer: Medicare Other | Attending: Emergency Medicine | Admitting: Emergency Medicine

## 2016-08-21 DIAGNOSIS — Z87891 Personal history of nicotine dependence: Secondary | ICD-10-CM | POA: Insufficient documentation

## 2016-08-21 DIAGNOSIS — I10 Essential (primary) hypertension: Secondary | ICD-10-CM | POA: Insufficient documentation

## 2016-08-21 DIAGNOSIS — M79609 Pain in unspecified limb: Secondary | ICD-10-CM | POA: Diagnosis not present

## 2016-08-21 DIAGNOSIS — E119 Type 2 diabetes mellitus without complications: Secondary | ICD-10-CM | POA: Insufficient documentation

## 2016-08-21 DIAGNOSIS — Z7984 Long term (current) use of oral hypoglycemic drugs: Secondary | ICD-10-CM | POA: Diagnosis not present

## 2016-08-21 DIAGNOSIS — M25511 Pain in right shoulder: Secondary | ICD-10-CM | POA: Diagnosis present

## 2016-08-21 DIAGNOSIS — Z79899 Other long term (current) drug therapy: Secondary | ICD-10-CM | POA: Insufficient documentation

## 2016-08-21 DIAGNOSIS — Z7982 Long term (current) use of aspirin: Secondary | ICD-10-CM | POA: Diagnosis not present

## 2016-08-21 MED ORDER — ACETAMINOPHEN 325 MG PO TABS: 650.0000 mg | ORAL_TABLET | Freq: Four times a day (QID) | ORAL | 0 refills | Status: AC | PRN

## 2016-08-21 NOTE — ED Provider Notes (Signed)
[facility] DEPT Provider Note   CSN: [id number] Arrival date & time: 08/21/16  1230     History   Chief Complaint Chief Complaint  Patient presents with  . arm pain after injection    HPI DONALDEEN HLINKA is a 83 y.o. female presenting after being told to come to the emergency department with concern of a DVT in her right upper arm. Patient received an iron infusion in the antecubital area of the right arm on Thursday. She had no complications in office went home and a few hours later began to experience shoulder pain over the scapular spine which then progressed on her arm to her elbow. She also reports some numbness and there right hand and difficulty grasping things. She reports a remote history of bursitis in the same shoulder in which she experienced exact same symptoms. She denies any other symptoms  HPI  Past Medical History:  Diagnosis Date  . Diabetes mellitus, type 2 ([place])   . Hyperlipidemia   . Hypertension   . Macular degeneration of left eye   . PMR (polymyalgia rheumatica) ([place]) 2003  . PONV (postoperative nausea and vomiting)   . Ulcer of right leg ([place])   . Venous insufficiency, peripheral     Patient Active Problem List   Diagnosis Date Noted  . Iron deficiency anemia 07/21/2016  . Sepsis ([place]) 11/24/2015  . Nephrolithiasis 11/24/2015  . Lactic acidosis 11/24/2015  . SOB (shortness of breath) 11/24/2015  . Nausea with vomiting 11/24/2015  . HLD (hyperlipidemia) 11/24/2015  . GERD without esophagitis 11/24/2015  . Status post skin graft-left pretibial region 04/24/2013  . Peripheral vascular disease, unspecified 10/30/2012  . Atherosclerosis of native arteries of the extremities with ulceration(440.23) 10/30/2012  . Vitamin B12 deficiency anemia 09/17/2012  . Hypertension 09/14/2012  . Diabetes mellitus ([place]) 09/14/2012  . Ulcer of right leg ([place]) 09/14/2012  . Anemia 09/14/2012    Past Surgical History:  Procedure Laterality Date  . CATARACT  EXTRACTION W/ INTRAOCULAR LENS IMPLANT Right   . CERVICAL BIOPSY  W/ LOOP ELECTRODE EXCISION  01-17-2001  DR LOMAX  . CYSTO/ HOD/ BLADDER BX  10-23-2006  DR PETERSON  . CYSTOSCOPY WITH STENT PLACEMENT Left 11/24/2015   Procedure: CYSTOSCOPY WITH STENT PLACEMENT left ureter left retrograde pylegram;  Surgeon: Ardis Hughs, MD;  Location: [facility];  Service: Urology;  Laterality: Left;  . ESOPHAGOGASTRODUODENOSCOPY N/A 09/15/2012   Procedure: ESOPHAGOGASTRODUODENOSCOPY (EGD);  Surgeon: Winfield Cunas., MD;  Location: [facility];  Service: Endoscopy;  Laterality: N/A;  . EYE SURGERY     rt cataract  . FRACTURE SURGERY  1975   lt elbow-fx  . SKIN SPLIT GRAFT Left 04/19/2013   Procedure: SKIN GRAFT FULL THICKNESS TO LEFT LEG FROM ABDOMEN;  Surgeon: Pedro Earls, MD;  Location: [facility];  Service: General;  Laterality: Left;  . TONSILLECTOMY      OB History    No data available       Home Medications    Prior to Admission medications   Medication Sig Start Date End Date Taking? Authorizing Provider  acetaminophen (TYLENOL) 500 MG tablet Take 1,000 mg by mouth every 6 (six) hours as needed (leg pain).    Historical Provider, MD  amLODipine (NORVASC) 5 MG tablet Take 10 mg by mouth daily.     Historical Provider, MD  aspirin EC 81 MG tablet Take 81 mg by mouth daily.    Historical Provider, MD  atorvastatin (LIPITOR) 20 MG  tablet Take 20 mg by mouth daily.    Historical Provider, MD  Calcium Carbonate-Vitamin D (CALCIUM + D PO) Take 600 mg by mouth daily.    Historical Provider, MD  furosemide (LASIX) 20 MG tablet Take 20 mg by mouth daily as needed (leg swelling).     Historical Provider, MD  glucose blood (ACCU-CHEK AVIVA PLUS) test strip Check upto 2x daily 03/04/15   Elayne Snare, MD  ibuprofen (ADVIL,MOTRIN) 200 MG tablet Take 200-400 mg by mouth every 6 (six) hours as needed for headache.    Historical Provider, MD  losartan (COZAAR) 100 MG tablet Take 100 mg by  mouth every morning.    Historical Provider, MD  metFORMIN (GLUCOPHAGE) 1000 MG tablet Take 1,000 mg by mouth 2 (two) times daily with a meal.    Historical Provider, MD  omeprazole (PRILOSEC OTC) 20 MG tablet Take 20 mg by mouth every morning.     Historical Provider, MD  polyethylene glycol (MIRALAX / GLYCOLAX) packet Take 17 g by mouth daily as needed.    Historical Provider, MD  potassium chloride (K-DUR) 10 MEQ tablet Take 10 mEq by mouth daily.    Historical Provider, MD  predniSONE (DELTASONE) 1 MG tablet Take 1 mg by mouth every morning. Take with 5mg  tablet to make 6mg     Historical Provider, MD  predniSONE (DELTASONE) 5 MG tablet Take 5 mg by mouth every morning. Take with 1mg  tablet to make 6mg     Historical Provider, MD  repaglinide (PRANDIN) 1 MG tablet TAKE 1 TABLET BEFORE BREAKFAST AND LUNCH AND TAKE 2 TABLETS BEFORE SUPPER Patient not taking: Reported on 07/21/2016 12/14/15   Elayne Snare, MD  trimethoprim (TRIMPEX) 100 MG tablet Take 100 mg by mouth daily as needed (for irritation (uti) take for abour 3-4 days).     Historical Provider, MD  VITAMIN B1-B12 IM Inject 1,000 mcg/mL into the muscle every 30 (thirty) days.    Historical Provider, MD    Family History Family History  Problem Relation Age of Onset  . Colon cancer Father   . Heart disease Father   . Hypertension Father   . Heart attack Father   . Cancer Father     unknown type cancer   . Heart disease Mother   . Anemia Mother   . Diabetes Neg Hx     Social History Social History  Substance Use Topics  . Smoking status: Former Smoker    Packs/day: 1.00    Years: 40.00    Types: Cigarettes    Quit date: 09/13/1991  . Smokeless tobacco: Never Used  . Alcohol use No     Allergies   Actos [pioglitazone] and Sulfa antibiotics   Review of Systems Review of Systems  Constitutional: Negative for chills and fever.  HENT: Negative for ear pain and sore throat.   Eyes: Negative for pain and visual disturbance.    Respiratory: Negative for cough, chest tightness, shortness of breath, wheezing and stridor.   Cardiovascular: Negative for chest pain, palpitations and leg swelling.  Gastrointestinal: Negative for abdominal pain, blood in stool, diarrhea, nausea and vomiting.  Genitourinary: Negative for dysuria and hematuria.  Musculoskeletal: Positive for arthralgias and myalgias. Negative for back pain, gait problem, joint swelling, neck pain and neck stiffness.       Reports pain at the right shoulder and along the biceps  Skin: Negative for color change, pallor and rash.  Neurological: Positive for weakness and numbness. Negative for dizziness, tremors, seizures, syncope, facial  asymmetry, light-headedness and headaches.  All other systems reviewed and are negative.    Physical Exam Updated Vital Signs BP 160/76 (BP Location: Right Arm)   Pulse 80   Temp 98.3 F (36.8 C) (Oral)   Resp 19   SpO2 96%   Physical Exam  Constitutional: She appears well-developed and well-nourished. No distress.  Patient is afebrile, nontoxic-appearing, sitting comfortably in the chair in no acute distress  HENT:  Head: Normocephalic and atraumatic.  Eyes: Conjunctivae and EOM are normal.  Neck: Normal range of motion. Neck supple.  Cardiovascular: Normal rate, regular rhythm and normal heart sounds.   No murmur heard. Pulmonary/Chest: Effort normal and breath sounds normal. No respiratory distress. She has no wheezes. She has no rales. She exhibits no tenderness.  Musculoskeletal: Normal range of motion. She exhibits tenderness. She exhibits no edema or deformity.  No swelling noted of the right shoulder and arm, no ecchymosis or palpated masses. No tenderness to palpation other than one focal area near subacromial bursa and supraspinatus tendon   Neurological: She is alert. No sensory deficit. She exhibits normal muscle tone. Coordination normal.  Neurologic Exam:   - Mental status: Patient is alert and  cooperative. Fluent speech and words are clear. Coherent thought processes and insight is good.  - Motor: No involuntary movements. Muscle tone and bulk normal throughout. Muscle strength is 5/5 in bilateral shoulder abduction, elbow flexion and extension, wrist flexion and extension, thumb opposition, finger abduction, and grip.  - Sensory: Proprioception, light tough sensation intact in both upper extremities.   - Reflexes: biceps, triceps, brachioradialis, patellar, Achilles 2 + and equal bilaterally; toes down going bilaterally.   - Cerebellar: point to point movement intact in upper extremities.   Skin: Skin is warm and dry. No rash noted. She is not diaphoretic. No erythema. No pallor.  Psychiatric: She has a normal mood and affect. Her behavior is normal.  Nursing note and vitals reviewed.    ED Treatments / Results  Labs (all labs ordered are listed, but only abnormal results are displayed) Labs Reviewed - No data to display  EKG  EKG Interpretation None       Radiology No results found.  Procedures Procedures (including critical care time)  Medications Ordered in ED Medications - No data to display   Initial Impression / Assessment and Plan / ED Course  I have reviewed the triage vital signs and the nursing notes.  Pertinent labs & imaging results that were available during my care of the patient were reviewed by me and considered in my medical decision making (see chart for details).     83 year old female presenting with right shoulder pain and sensation that she can't use her right hand as well. She was seen in urgent care and told that she needed to go to the emergency department as they would not evaluate her and she may need to be ruled out for a clot in her arm. She believes that her iron infusion on Thursday had something to do with this. She does report history of exactly the same symptoms and he years ago with bursitis of the same shoulder. Exam is  reassuring, she was neurovascularly intact in both extremities. Strong and equal grips she denies any gross differences in sensation between the 2 extremities.No gross focal neural deficits. No swelling noted or ecchymosis. No evidence of superficial phlebitis. Patient is well-appearing.  Ordered Doppler upper extremity  Transferred patient care at end of shift to Will Dansie, PA-C  pending ultrasound study results. Anticipate discharge home with close PCP follow-up and rice protocol if results negative. If DVT, then will provide appropriate management.  Patient was discussed with Dr. Lacinda Axon who also has seen patient and agrees with assessment and plan.  Final Clinical Impressions(s) / ED Diagnoses   Final diagnoses:  Acute pain of right shoulder    New Prescriptions New Prescriptions   No medications on file     Dossie Der 08/21/16 1656    Nat Christen, MD 08/22/16 1630

## 2016-08-21 NOTE — ED Notes (Signed)
Pt ambulatory and independent at discharge.  Verbalized understanding of discharge instructions 

## 2016-08-21 NOTE — ED Triage Notes (Signed)
Patient states that she was seen on Thursday and had Ferumoxytol injection. Patient reports later that night she started having pain in right arm. Patient didn't notify MD office about pain because she thought it would quit.  Patient concerned for blood clot.

## 2016-08-21 NOTE — ED Notes (Signed)
Delay on vital signs doctor is in the room at this time

## 2016-08-21 NOTE — Progress Notes (Signed)
VASCULAR LAB PRELIMINARY  PRELIMINARY  PRELIMINARY  PRELIMINARY  Right upper extremity venous duplex completed.    Preliminary report:  There is no DVT or SVT noted in the right upper extremity.   Gave report to Waynetta Pean, PA-C  Josias Tomerlin, RVT 08/21/2016, 6:10 PM

## 2016-08-21 NOTE — ED Provider Notes (Signed)
Patient care assumed from [place], PA-C at shift change. Please see her note for further.  Briefly, patient presented with right upper arm pain after iron infusion several days ago.  Plan at shift change was for patient to receive a DVT study to R/o DVT. Plan was for discharge if study is negative with tylenol.  Patient's DVT study is negative. Will discharge with Tylenol. I encouraged her to follow-up with primary care. I discussed return precautions. I advised the patient to follow-up with their primary care provider this week. I advised the patient to return to the emergency department with new or worsening symptoms or new concerns. The patient verbalized understanding and agreement with plan.    Acute pain of right shoulder     Waynetta Pean, PA-C 08/21/16 1826    Nat Christen, MD 08/22/16 1630

## 2016-08-21 NOTE — ED Notes (Signed)
ED Provider at bedside. 

## 2016-08-22 ENCOUNTER — Telehealth: Payer: Self-pay | Admitting: Hematology

## 2016-08-22 NOTE — Telephone Encounter (Signed)
Pt called our answering service on 2/4. She had a second IV Feraheme on February 1, and developed right shoulder pain yesterday. She also complains of some weakness on her fingers where she had of the IV insertion. She was sent to emergency room by the answering service, DVT was negative. She was released from ET. Her symptom is stable today, no other new symptoms. I encouraged her to use some heating pad, and take Tylenol or ibuprofen as needed. She has lab appointment and see me in late March. Her anemia has improved after IV Feraheme. She appreciated the call.  Truitt Merle  08/22/2016

## 2016-08-23 ENCOUNTER — Telehealth: Payer: Self-pay

## 2016-08-23 NOTE — Telephone Encounter (Signed)
Still having with right shoulder and arm. Went to ER Sunday. Negative for blood clot. Heating pad, tylenol/ibuprofen not helping. Cannot do buttons or zipper with R hand. Large pain, throbbing. Started about Thursday night or Friday morning. Have not contacted PCP.   S/w Dr Burr Medico and instructed pt to call PCP. Pt stating she needs something for the pain. Pt asked if cannot get in to PCP what to do. Told her to call us and we can see if we can facilitate getting into PCP.

## 2016-08-29 ENCOUNTER — Encounter: Payer: Medicare Other | Admitting: Nurse Practitioner

## 2016-08-29 NOTE — Telephone Encounter (Addendum)
Called patient for Nancy Glover evaluation.  She can arrive for 12:15 pm evaluation.  Speech clear, appropriate and denies any changes to face or other extremities.

## 2016-08-29 NOTE — Telephone Encounter (Signed)
Pt is in constant pain. Went to Urgent Care who sent her to ED to r/o blood clot. On 2/6 Dr Burr Medico instructed her to see her PCP. Her PCP sent her to walk in clinic where she got some pain pills. They make her sleepy/groggy all the time and do not take the pain away.  This started after getting second feraheme on 2/1.  Started in shoulder and went down to her arm, her fingers are affected where it is hard to do buttons.  Muscle rub has not helped either.  The other triage nurse was working on this the same time. She had told pt to come to [facility] today. Through coordination of work, an orthopedic referral was handled by [employer].

## 2016-08-29 NOTE — Telephone Encounter (Addendum)
"  My wife was there a week ago for second iron infusion.  That night her right shoulder started hurting.  Pain went down her arm and now to the fingers.  No problems with the first infusion but the second time it hurt when the needle was put in the bend of her arm and that evening she started having this problem.  Went to urgent care who sent her to ED who prescribed pain medicine which puts her to sleep.  Went to after hours clinic for Dr. Tamala Julian and also Dr. Tamala Julian.  They think the use of the cane is the cause but she's used a cane for two years and not had this problem,.  Having constant pain from shoulder down arm to fingers.  No feeling to thumb and forefinger.  Can't button a button or tie shoes.  No control of the right hand and all this started after the iron infusion.  Return number [phone].  We live in [place] and can be there in forty minutes. She is a diabetic but this is new symptom."  Denies warmth, redness, itching or red or blue streaks to right arm.  Admits to "maybe a little swelling to rt shoulder, it feels tight.  This has not improved any since the iron infusion was received."

## 2016-08-29 NOTE — Telephone Encounter (Signed)
Second nurse received order for patient to see PCP.  Called patient who reports she has already spoken to PCP today who plans to refer to Orthopedic.  "I can't wait that long."  This nurse spoke with APP who has order from Dr. Benay Spice.  Clarified too far out for injection reaction which occurs during treatment.  Orthopedic needed.  Apologized for any miscommunication.  "I need something done today.  Suggested she call [facility] or [facility] for Urgent Orthopedic Care.  Thanked me for providing orthopedic urgent care information.

## 2016-08-30 ENCOUNTER — Telehealth: Payer: Self-pay | Admitting: *Deleted

## 2016-08-30 NOTE — Telephone Encounter (Signed)
-----   Message from Truitt Merle, MD sent at 08/27/2016  7:38 PM EST ----- Please let her know the lab result, iron deficiency resolved now, anemia improved, continue follow up.  Truitt Merle  08/27/2016

## 2016-08-30 NOTE — Telephone Encounter (Signed)
Called pt & informed of Dr Ernestina Penna message.  Pt reports arm aching since IV iron.  She states that she has had bursitis before & it feels like that.  Suggested she contact her PCP.  She states she has & is waiting to hear back from her.  She will f/u tomorrow with PCP to see if referral has been made.

## 2016-08-30 NOTE — Progress Notes (Signed)
Pt has had a colonoscopy in 2012 & asked Dr Johnnette Gourd office to fax result.

## 2016-09-12 ENCOUNTER — Telehealth: Payer: Self-pay

## 2016-09-12 NOTE — Telephone Encounter (Signed)
Mr Lay is cancelling appts with Dr Burr Medico. The pt's arm and shoulder have been "messed up since the iron infusion". Pt does have nerve conduction test scheduled for 3/28. Pt will call when she is ready to reschedule after her arm gets better. 3/1 lab and 3/29 lab/MD appts cancelled.

## 2016-09-15 ENCOUNTER — Other Ambulatory Visit: Payer: Medicare Other

## 2016-09-28 ENCOUNTER — Encounter (HOSPITAL_COMMUNITY): Payer: Self-pay | Admitting: *Deleted

## 2016-09-28 DIAGNOSIS — I10 Essential (primary) hypertension: Secondary | ICD-10-CM | POA: Insufficient documentation

## 2016-09-28 DIAGNOSIS — R69 Illness, unspecified: Secondary | ICD-10-CM | POA: Diagnosis present

## 2016-09-28 DIAGNOSIS — Z87891 Personal history of nicotine dependence: Secondary | ICD-10-CM | POA: Insufficient documentation

## 2016-09-28 DIAGNOSIS — Z7982 Long term (current) use of aspirin: Secondary | ICD-10-CM | POA: Insufficient documentation

## 2016-09-28 DIAGNOSIS — Z7984 Long term (current) use of oral hypoglycemic drugs: Secondary | ICD-10-CM | POA: Insufficient documentation

## 2016-09-28 DIAGNOSIS — N3001 Acute cystitis with hematuria: Secondary | ICD-10-CM | POA: Diagnosis not present

## 2016-09-28 DIAGNOSIS — E119 Type 2 diabetes mellitus without complications: Secondary | ICD-10-CM | POA: Insufficient documentation

## 2016-09-28 LAB — URINALYSIS, ROUTINE W REFLEX MICROSCOPIC
GLUCOSE, UA: 100 mg/dL — AB
KETONES UR: 15 mg/dL — AB
Nitrite: POSITIVE — AB
PH: 5.5 (ref 5.0–8.0)
Protein, ur: 100 mg/dL — AB
SPECIFIC GRAVITY, URINE: 1.025 (ref 1.005–1.030)

## 2016-09-28 LAB — CBC
HEMATOCRIT: 42 % (ref 36.0–46.0)
HEMOGLOBIN: 14.2 g/dL (ref 12.0–15.0)
MCH: 27.7 pg (ref 26.0–34.0)
MCHC: 33.8 g/dL (ref 30.0–36.0)
MCV: 82 fL (ref 78.0–100.0)
Platelets: 137 10*3/uL — ABNORMAL LOW (ref 150–400)
RBC: 5.12 MIL/uL — ABNORMAL HIGH (ref 3.87–5.11)
RDW: 19.9 % — AB (ref 11.5–15.5)
WBC: 13.3 10*3/uL — ABNORMAL HIGH (ref 4.0–10.5)

## 2016-09-28 LAB — COMPREHENSIVE METABOLIC PANEL
ALT: 14 U/L (ref 14–54)
ANION GAP: 9 (ref 5–15)
AST: 14 U/L — ABNORMAL LOW (ref 15–41)
Albumin: 3 g/dL — ABNORMAL LOW (ref 3.5–5.0)
Alkaline Phosphatase: 85 U/L (ref 38–126)
BUN: 11 mg/dL (ref 6–20)
CHLORIDE: 100 mmol/L — AB (ref 101–111)
CO2: 26 mmol/L (ref 22–32)
Calcium: 8.5 mg/dL — ABNORMAL LOW (ref 8.9–10.3)
Creatinine, Ser: 0.78 mg/dL (ref 0.44–1.00)
Glucose, Bld: 248 mg/dL — ABNORMAL HIGH (ref 65–99)
POTASSIUM: 3.1 mmol/L — AB (ref 3.5–5.1)
Sodium: 135 mmol/L (ref 135–145)
TOTAL PROTEIN: 5.7 g/dL — AB (ref 6.5–8.1)
Total Bilirubin: 1.3 mg/dL — ABNORMAL HIGH (ref 0.3–1.2)

## 2016-09-28 LAB — URINALYSIS, MICROSCOPIC (REFLEX)

## 2016-09-28 LAB — LIPASE, BLOOD: LIPASE: 15 U/L (ref 11–51)

## 2016-09-28 LAB — CBG MONITORING, ED: GLUCOSE-CAPILLARY: 233 mg/dL — AB (ref 65–99)

## 2016-09-28 NOTE — ED Notes (Signed)
Received call from lab that urine specimen had spilled en route to lab. Lab needs specimen recollected.

## 2016-09-28 NOTE — ED Triage Notes (Signed)
Pt states ha she has not been feeling well today and slept most of the day.  Pt reports fatigue, cough and burning with urination.  Pt is alert and oriented.

## 2016-09-29 ENCOUNTER — Emergency Department (HOSPITAL_COMMUNITY)
Admission: EM | Admit: 2016-09-29 | Discharge: 2016-09-29 | Disposition: A | Discharge status: Home / Self Care | Payer: Medicare Other | Attending: Emergency Medicine | Admitting: Emergency Medicine

## 2016-09-29 DIAGNOSIS — N3001 Acute cystitis with hematuria: Secondary | ICD-10-CM

## 2016-09-29 LAB — CBG MONITORING, ED: Glucose-Capillary: 229 mg/dL — ABNORMAL HIGH (ref 65–99)

## 2016-09-29 MED ORDER — CIPROFLOXACIN HCL 500 MG PO TABS
250.0000 mg | ORAL_TABLET | Freq: Once | ORAL | Status: AC
  Administered 2016-09-29: 250 mg via ORAL
  Filled 2016-09-29: qty 1

## 2016-09-29 MED ORDER — CIPROFLOXACIN HCL 250 MG PO TABS: 250.0000 mg | ORAL_TABLET | Freq: Two times a day (BID) | ORAL | 0 refills | Status: AC

## 2016-09-29 NOTE — ED Provider Notes (Signed)
[facility] DEPT Provider Note   CSN: [id number] Arrival date & time: 09/28/16  2045  By signing my name below, I, Nancy Glover, attest that this documentation has been prepared under the direction and in the presence of Fatima Blank, MD.  Electronically Signed: Julien Nordmann, ED Scribe. 09/29/16. 3:00 AM.    History   Chief Complaint Chief Complaint  Patient presents with  . Illness    fatigue, feels unwell, burning with urination   The history is provided by the patient. No language interpreter was used.   HPI Comments: Nancy Glover is a 83 y.o. female who has a PMhx of DMII, HTN, HLD, PMR, presents to the Emergency Department complaining of moderate, generalized fatigue that began today. She states that she "slept all day". Pt reports associated dysuria x 1 month, and nausea. Pt expresses she has a hx of frequent UTI's in the past. She notes having a UTI ~ 2 weeks ago, stating that she was taking Cipro and then completed the course. Pt says once she stopped taking the medication, her symptoms started again. She denies fever, vomiting, abdominal pain, chest pain, shortness of breath, or rhinorrhea.  Past Medical History:  Diagnosis Date  . Diabetes mellitus, type 2 ([place])   . Hyperlipidemia   . Hypertension   . Macular degeneration of left eye   . PMR (polymyalgia rheumatica) ([place]) 2003  . PONV (postoperative nausea and vomiting)   . Ulcer of right leg ([place])   . Venous insufficiency, peripheral     Patient Active Problem List   Diagnosis Date Noted  . Iron deficiency anemia 07/21/2016  . Sepsis ([place]) 11/24/2015  . Nephrolithiasis 11/24/2015  . Lactic acidosis 11/24/2015  . SOB (shortness of breath) 11/24/2015  . Nausea with vomiting 11/24/2015  . HLD (hyperlipidemia) 11/24/2015  . GERD without esophagitis 11/24/2015  . Status post skin graft-left pretibial region 04/24/2013  . Peripheral vascular disease, unspecified 10/30/2012  . Atherosclerosis of  native arteries of the extremities with ulceration(440.23) 10/30/2012  . Vitamin B12 deficiency anemia 09/17/2012  . Hypertension 09/14/2012  . Diabetes mellitus ([place]) 09/14/2012  . Ulcer of right leg ([place]) 09/14/2012  . Anemia 09/14/2012    Past Surgical History:  Procedure Laterality Date  . CATARACT EXTRACTION W/ INTRAOCULAR LENS IMPLANT Right   . CERVICAL BIOPSY  W/ LOOP ELECTRODE EXCISION  01-17-2001  DR LOMAX  . CYSTO/ HOD/ BLADDER BX  10-23-2006  DR PETERSON  . CYSTOSCOPY WITH STENT PLACEMENT Left 11/24/2015   Procedure: CYSTOSCOPY WITH STENT PLACEMENT left ureter left retrograde pylegram;  Surgeon: Ardis Hughs, MD;  Location: [facility];  Service: Urology;  Laterality: Left;  . ESOPHAGOGASTRODUODENOSCOPY N/A 09/15/2012   Procedure: ESOPHAGOGASTRODUODENOSCOPY (EGD);  Surgeon: Winfield Cunas., MD;  Location: [facility];  Service: Endoscopy;  Laterality: N/A;  . EYE SURGERY     rt cataract  . FRACTURE SURGERY  1975   lt elbow-fx  . SKIN SPLIT GRAFT Left 04/19/2013   Procedure: SKIN GRAFT FULL THICKNESS TO LEFT LEG FROM ABDOMEN;  Surgeon: Videl Nobrega Earls, MD;  Location: [facility];  Service: General;  Laterality: Left;  . TONSILLECTOMY      OB History    No data available       Home Medications    Prior to Admission medications   Medication Sig Start Date End Date Taking? Authorizing Provider  acetaminophen (TYLENOL) 325 MG tablet Take 2 tablets (650 mg total) by mouth every 6 (six) hours as  needed. 08/21/16   Waynetta Pean, PA-C  amLODipine (NORVASC) 5 MG tablet Take 10 mg by mouth daily.     Historical Provider, MD  aspirin EC 81 MG tablet Take 81 mg by mouth daily.    Historical Provider, MD  atorvastatin (LIPITOR) 20 MG tablet Take 20 mg by mouth daily.    Historical Provider, MD  Calcium Carbonate-Vitamin D (CALCIUM + D PO) Take 600 mg by mouth daily.    Historical Provider, MD  ciprofloxacin (CIPRO) 250 MG tablet Take 1 tablet (250 mg total) by  mouth 2 (two) times daily. 09/29/16 10/06/16  Fatima Blank, MD  furosemide (LASIX) 20 MG tablet Take 20 mg by mouth daily as needed (leg swelling).     Historical Provider, MD  glucose blood (ACCU-CHEK AVIVA PLUS) test strip Check upto 2x daily 03/04/15   Elayne Snare, MD  ibuprofen (ADVIL,MOTRIN) 200 MG tablet Take 200-400 mg by mouth every 6 (six) hours as needed for headache.    Historical Provider, MD  losartan (COZAAR) 100 MG tablet Take 100 mg by mouth every morning.    Historical Provider, MD  metFORMIN (GLUCOPHAGE) 1000 MG tablet Take 1,000 mg by mouth 2 (two) times daily with a meal.    Historical Provider, MD  omeprazole (PRILOSEC OTC) 20 MG tablet Take 20 mg by mouth every morning.     Historical Provider, MD  polyethylene glycol (MIRALAX / GLYCOLAX) packet Take 17 g by mouth daily as needed.    Historical Provider, MD  potassium chloride (K-DUR) 10 MEQ tablet Take 10 mEq by mouth daily.    Historical Provider, MD  predniSONE (DELTASONE) 1 MG tablet Take 1 mg by mouth every morning. Take with 5mg  tablet to make 6mg     Historical Provider, MD  predniSONE (DELTASONE) 5 MG tablet Take 5 mg by mouth every morning. Take with 1mg  tablet to make 6mg     Historical Provider, MD  repaglinide (PRANDIN) 1 MG tablet TAKE 1 TABLET BEFORE BREAKFAST AND LUNCH AND TAKE 2 TABLETS BEFORE SUPPER Patient not taking: Reported on 07/21/2016 12/14/15   Elayne Snare, MD  trimethoprim (TRIMPEX) 100 MG tablet Take 100 mg by mouth daily as needed (for irritation (uti) take for abour 3-4 days).     Historical Provider, MD  VITAMIN B1-B12 IM Inject 1,000 mcg/mL into the muscle every 30 (thirty) days.    Historical Provider, MD    Family History Family History  Problem Relation Age of Onset  . Colon cancer Father   . Heart disease Father   . Hypertension Father   . Heart attack Father   . Cancer Father     unknown type cancer   . Heart disease Mother   . Anemia Mother   . Diabetes Neg Hx     Social  History Social History  Substance Use Topics  . Smoking status: Former Smoker    Packs/day: 1.00    Years: 40.00    Types: Cigarettes    Quit date: 09/13/1991  . Smokeless tobacco: Never Used  . Alcohol use No     Allergies   Actos [pioglitazone] and Sulfa antibiotics   Review of Systems Review of Systems  A complete 10 system review of systems was obtained and all systems are negative except as noted in the HPI and PMH.    Physical Exam Updated Vital Signs BP 132/65 (BP Location: Left Arm)   Pulse 94   Temp 98.4 F (36.9 C) (Oral)   Resp 20  Wt 155 lb (70.3 kg)   SpO2 99%   BMI 27.46 kg/m   Physical Exam  Constitutional: She is oriented to person, place, and time. She appears well-developed and well-nourished. No distress.  HENT:  Head: Normocephalic and atraumatic.  Right Ear: External ear normal.  Left Ear: External ear normal.  Nose: Nose normal.  Eyes: Conjunctivae and EOM are normal. No scleral icterus.  Neck: Normal range of motion and phonation normal.  Cardiovascular: Normal rate and regular rhythm.   Pulmonary/Chest: Effort normal. No stridor. No respiratory distress.  Abdominal: She exhibits no distension.  Musculoskeletal: Normal range of motion. She exhibits no edema.  Neurological: She is alert and oriented to person, place, and time.  Skin: She is not diaphoretic.  Psychiatric: She has a normal mood and affect. Her behavior is normal.  Vitals reviewed.    ED Treatments / Results  DIAGNOSTIC STUDIES: Oxygen Saturation is 99% on RA, normal by my interpretation.  COORDINATION OF CARE:  1:21 AM Discussed treatment plan with pt at bedside and pt agreed to plan.  Labs (all labs ordered are listed, but only abnormal results are displayed) Labs Reviewed  COMPREHENSIVE METABOLIC PANEL - Abnormal; Notable for the following:       Result Value   Potassium 3.1 (*)    Chloride 100 (*)    Glucose, Bld 248 (*)    Calcium 8.5 (*)    Total Protein  5.7 (*)    Albumin 3.0 (*)    AST 14 (*)    Total Bilirubin 1.3 (*)    All other components within normal limits  CBC - Abnormal; Notable for the following:    WBC 13.3 (*)    RBC 5.12 (*)    RDW 19.9 (*)    Platelets 137 (*)    All other components within normal limits  URINALYSIS, ROUTINE W REFLEX MICROSCOPIC - Abnormal; Notable for the following:    APPearance CLOUDY (*)    Glucose, UA 100 (*)    Hgb urine dipstick MODERATE (*)    Bilirubin Urine SMALL (*)    Ketones, ur 15 (*)    Protein, ur 100 (*)    Nitrite POSITIVE (*)    Leukocytes, UA MODERATE (*)    All other components within normal limits  URINALYSIS, MICROSCOPIC (REFLEX) - Abnormal; Notable for the following:    Bacteria, UA MANY (*)    Squamous Epithelial / LPF 0-5 (*)    All other components within normal limits  CBG MONITORING, ED - Abnormal; Notable for the following:    Glucose-Capillary 233 (*)    All other components within normal limits  CBG MONITORING, ED - Abnormal; Notable for the following:    Glucose-Capillary 229 (*)    All other components within normal limits  LIPASE, BLOOD    EKG  EKG Interpretation None       Radiology No results found.  Procedures Procedures (including critical care time)  Medications Ordered in ED Medications  ciprofloxacin (CIPRO) tablet 250 mg (not administered)     Initial Impression / Assessment and Plan / ED Course  I have reviewed the triage vital signs and the nursing notes.  Pertinent labs & imaging results that were available during my care of the patient were reviewed by me and considered in my medical decision making (see chart for details).     Workup consistent with urinary tract infection. Given patient's history of recurrent infections and current dysuria we'll continue treatment with Cipro. Patient  will require close follow-up with primary care provider given the frequency of her urinary tract infections and the fact that she was just  recently treated for the same. No evidence of pyelonephritis at this time. Patient able to tolerate by mouth. Feel she is safe for discharge with strict return precautions.  Final Clinical Impressions(s) / ED Diagnoses   Final diagnoses:  Acute cystitis with hematuria   Disposition: Discharge  Condition: Good  I have discussed the results, Dx and Tx plan with the patient who expressed understanding and agree(s) with the plan. Discharge instructions discussed at great length. The patient was given strict return precautions who verbalized understanding of the instructions. No further questions at time of discharge.    New Prescriptions   CIPROFLOXACIN (CIPRO) 250 MG TABLET    Take 1 tablet (250 mg total) by mouth 2 (two) times daily.    Follow Up: [name], [place] [id number] [phone]  Schedule an appointment as soon as possible for a visit  in 3-5 days For close follow up   I personally performed the services described in this documentation, which was scribed in my presence. The recorded information has been reviewed and is accurate.        Fatima Blank, MD 09/29/16 [phone]

## 2016-10-01 LAB — URINE CULTURE

## 2016-10-02 ENCOUNTER — Telehealth: Payer: Self-pay | Admitting: Emergency Medicine

## 2016-10-02 NOTE — Telephone Encounter (Signed)
Post ED Visit - Positive Culture Follow-up: Successful Patient Follow-Up  Culture assessed and recommendations reviewed by: []  Elenor Quinones, Pharm.D. []  Heide Guile, Pharm.D., BCPS []  Parks Neptune, Pharm.D. []  Alycia Rossetti, Pharm.D., BCPS []  [place], Pharm.D., BCPS, AAHIVP []  Legrand Como, Pharm.D., BCPS, AAHIVP []  Cassie Stewart, Pharm.D. []  Stephens November, Pharm.D. Salome Arnt PharmD  Positive urine culture  []  Patient discharged without antimicrobial prescription and treatment is now indicated [x]  Organism is resistant to prescribed ED discharge antimicrobial []  Patient with positive blood cultures  Changes discussed with ED provider: Comer Locket PA New antibiotic prescription stop Ciprofloxacin, start Keflex 500mg  po bid x 7 days Called to [facility] [place]  Contacted patient,   Hazle Nordmann 10/02/2016, 1:41 PM

## 2016-10-02 NOTE — Progress Notes (Signed)
ED Antimicrobial Stewardship Positive Culture Follow Up   Nancy Glover is an 83 y.o. female who presented to [facility] on 09/29/2016 with a chief complaint of  Chief Complaint  Patient presents with  . Illness    fatigue, feels unwell, burning with urination    Recent Results (from the past 720 hour(s))  Urine culture     Status: Abnormal   Collection Time: 09/29/16  3:01 AM  Result Value Ref Range Status   Specimen Description URINE, CLEAN CATCH  Final   Special Requests NONE  Final   Culture >=100,000 COLONIES/mL ESCHERICHIA COLI (A)  Final   Report Status 10/01/2016 FINAL  Final   Organism ID, Bacteria ESCHERICHIA COLI (A)  Final      Susceptibility   Escherichia coli - MIC*    AMPICILLIN <=2 SENSITIVE Sensitive     CEFAZOLIN <=4 SENSITIVE Sensitive     CEFTRIAXONE <=1 SENSITIVE Sensitive     CIPROFLOXACIN >=4 RESISTANT Resistant     GENTAMICIN <=1 SENSITIVE Sensitive     IMIPENEM <=0.25 SENSITIVE Sensitive     NITROFURANTOIN <=16 SENSITIVE Sensitive     TRIMETH/SULFA <=20 SENSITIVE Sensitive     AMPICILLIN/SULBACTAM <=2 SENSITIVE Sensitive     PIP/TAZO <=4 SENSITIVE Sensitive     Extended ESBL NEGATIVE Sensitive     * >=100,000 COLONIES/mL ESCHERICHIA COLI    [x]  Treated with ciprofloxacin, organism resistant to prescribed antimicrobial []  Patient discharged originally without antimicrobial agent and treatment is now indicated  New antibiotic prescription: DC cipro, start cephalexin 500mg  PO BID x 7 days  ED Provider: Comer Locket, PA   [place], Rande Lawman 10/02/2016, 9:19 AM Clinical Pharmacist Phone# [phone]

## 2016-10-12 ENCOUNTER — Encounter: Payer: Medicare Other | Admitting: Neurology

## 2016-10-12 ENCOUNTER — Telehealth: Payer: Self-pay | Admitting: Neurology

## 2016-10-12 NOTE — Telephone Encounter (Signed)
This patient did not show for an EMG and nerve conduction study evaluation today. 

## 2016-10-13 ENCOUNTER — Ambulatory Visit: Payer: Medicare Other | Admitting: Hematology

## 2016-10-13 ENCOUNTER — Other Ambulatory Visit: Payer: Medicare Other

## 2016-10-17 ENCOUNTER — Encounter: Payer: Self-pay | Admitting: Neurology

## 2016-10-26 LAB — HEMOGLOBIN A1C: HEMOGLOBIN A1C: 11.3

## 2016-10-28 ENCOUNTER — Telehealth: Payer: Self-pay | Admitting: Hematology

## 2016-10-28 ENCOUNTER — Telehealth: Payer: Self-pay | Admitting: *Deleted

## 2016-10-28 NOTE — Telephone Encounter (Signed)
Called pt per Dr Ernestina Penna request & asked if she is on any oral iron.  She reports no.  Informed that labs received from PCP & iron low & Dr Burr Medico suggested she take oral iron-ferrous sulfate over the counter at least daily.  Pt has no f/u here & desires to f/u with PCP.  Informed that if she needed to be seen again to let us know. She expressed understanding.

## 2016-10-28 NOTE — Telephone Encounter (Signed)
Appointment scheduled per 10/28/16 schd msg. Called patient to confirm appointment, left msg on vm. 10/28/16

## 2016-10-31 ENCOUNTER — Ambulatory Visit: Payer: Medicare Other | Admitting: Hematology

## 2016-11-04 ENCOUNTER — Encounter: Payer: Self-pay | Admitting: Hematology

## 2016-11-04 ENCOUNTER — Telehealth: Payer: Self-pay | Admitting: Hematology

## 2016-11-04 ENCOUNTER — Ambulatory Visit (HOSPITAL_BASED_OUTPATIENT_CLINIC_OR_DEPARTMENT_OTHER): Payer: Medicare Other | Admitting: Hematology

## 2016-11-04 VITALS — BP 143/54 | HR 66 | Temp 97.7°F | Resp 18 | Ht 63.0 in | Wt 145.7 lb

## 2016-11-04 DIAGNOSIS — I10 Essential (primary) hypertension: Secondary | ICD-10-CM

## 2016-11-04 DIAGNOSIS — R200 Anesthesia of skin: Secondary | ICD-10-CM | POA: Diagnosis not present

## 2016-11-04 DIAGNOSIS — E119 Type 2 diabetes mellitus without complications: Secondary | ICD-10-CM

## 2016-11-04 DIAGNOSIS — E538 Deficiency of other specified B group vitamins: Secondary | ICD-10-CM

## 2016-11-04 DIAGNOSIS — M79605 Pain in left leg: Secondary | ICD-10-CM | POA: Diagnosis not present

## 2016-11-04 DIAGNOSIS — D509 Iron deficiency anemia, unspecified: Secondary | ICD-10-CM

## 2016-11-04 DIAGNOSIS — M2550 Pain in unspecified joint: Secondary | ICD-10-CM | POA: Diagnosis not present

## 2016-11-04 DIAGNOSIS — G8929 Other chronic pain: Secondary | ICD-10-CM | POA: Diagnosis not present

## 2016-11-04 DIAGNOSIS — E118 Type 2 diabetes mellitus with unspecified complications: Secondary | ICD-10-CM

## 2016-11-04 NOTE — Progress Notes (Signed)
[facility]  [phone] [phone]  Clinic Follow up Note   Patient Care Team: Candace Smith, MD as PCP - General (Family Medicine) 11/04/2016  Referring physician: SMITH,CANDACE THIELE, MD  CHIEF COMPLAINTS/PURPOSE OF CONSULTATION:  Iron deficient anemia  HISTORY OF PRESENTING ILLNESS (07/21/2016):  Nancy Glover 83 y.o. female is here because of her chronic anemia, from iron deficiency and B12 deficiency. She was accompanied by her husband to clinic today. She was referred by her primary care physician Dr. Smith  She was found to have abnormal CBC from at least a few years. She was hospitalized in [place] 2014 for severe anemia with hemoglobin 6.8, received a blood transfusion. Anemia workup showed low ferritin, serum iron, and low B12 levels. Stool. Was positive. She underwent EGD by Dr. Edwards, which was negative. She does not remember if she had colonoscopy on not afterwards. She followed with her primary care physician afterwards, has had a few course of oral iron, but did not tolerate well and is stopped. Over the past few years, she also received IV iron a few times. Her hemoglobin has been in the range 8-10.5g/dl.   She had left leg injury from falls about 3 years ago, and required surgery. She has had a chronic left leg pain and a swollen since then. She is not physically active due to the pain. She walks with a cane.   She has moderate fatigue.She denies recent chest pain on exertion, shortness of breath on minimal exertion, pre-syncopal episodes, or palpitations. She had not noticed any recent bleeding such as epistaxis, hematuria or hematochezia The patient takes regular dose aspirin daily for her leg pain.  She had no prior history or diagnosis of cancer. Her age appropriate screening programs are up-to-date. She denies any pica and eats a variety of diet. She never donated blood or received blood transfusion The patient was prescribed  oral iron supplements but only took 2-3 months due to poor tolerance.   Patient has poorly controlled diabetes and hypertension. She does not care about her diet at all, drinks quite a bit soft drinks, and eats candies, she spends most time in chair watching TV during the day. Her husband encourages her to eat healthy and exercise, but she is not motivated to do so.  PREVIOUS AND CURRENT THERAPY 1. Pt received IV feraheme on 08/11/2016 and 08/18/2016, responded well, anemia resolved after 2. Oral ferrous sulfate 1-2 tab daily  INTERIM HISTORY: She returns for follow up. She is doing well other than numbness in two fingers on her right hand post-IV iron in her right arm. She reports hardly being able to write or do anything with her right hand. She reports difficulty picking things up. She denies pain in her right arm. She has limited ROM in her right arm and is unable to lift her right arm. She is right-handed. She saw her PCP who sent her to an orthopedist. She went to an orthopaedics who put something on her neck which did not help.  She was not interested in having needles in her neck. She has been to physical therapy twice and stopped going because it did not help and was expensive. She was not taught how to continue physical therapy at home. She cannot tell if there has been an improvement in her energy level since IV iron.    MEDICAL HISTORY:  Past Medical History:  Diagnosis Date  . Diabetes mellitus, type 2 (HCC)   . Hyperlipidemia   .   Hypertension   . Macular degeneration of left eye   . PMR (polymyalgia rheumatica) (HCC) 2003  . PONV (postoperative nausea and vomiting)   . Ulcer of right leg (HCC)   . Venous insufficiency, peripheral     SURGICAL HISTORY: Past Surgical History:  Procedure Laterality Date  . CATARACT EXTRACTION W/ INTRAOCULAR LENS IMPLANT Right   . CERVICAL BIOPSY  W/ LOOP ELECTRODE EXCISION  01-17-2001  DR LOMAX  . CYSTO/ HOD/ BLADDER BX  10-23-2006  DR PETERSON    . CYSTOSCOPY WITH STENT PLACEMENT Left 11/24/2015   Procedure: CYSTOSCOPY WITH STENT PLACEMENT left ureter left retrograde pylegram;  Surgeon: Benjamin W Herrick, MD;  Location: [facility];  Service: Urology;  Laterality: Left;  . ESOPHAGOGASTRODUODENOSCOPY N/A 09/15/2012   Procedure: ESOPHAGOGASTRODUODENOSCOPY (EGD);  Surgeon: James L Edwards Jr., MD;  Location: [facility];  Service: Endoscopy;  Laterality: N/A;  . EYE SURGERY     rt cataract  . FRACTURE SURGERY  1975   lt elbow-fx  . SKIN SPLIT GRAFT Left 04/19/2013   Procedure: SKIN GRAFT FULL THICKNESS TO LEFT LEG FROM ABDOMEN;  Surgeon: Matthew B Martin, MD;  Location: [facility];  Service: General;  Laterality: Left;  . TONSILLECTOMY      SOCIAL HISTORY: Social History   Social History  . Marital status: Married    Spouse name: N/A  . Number of children: N/A  . Years of education: N/A   Occupational History  . Not on file.   Social History Main Topics  . Smoking status: Former Smoker    Packs/day: 1.00    Years: 40.00    Types: Cigarettes    Quit date: 09/13/1991  . Smokeless tobacco: Never Used  . Alcohol use No  . Drug use: No  . Sexual activity: No   Other Topics Concern  . Not on file   Social History Narrative  . No narrative on file    FAMILY HISTORY: Family History  Problem Relation Age of Onset  . Colon cancer Father   . Heart disease Father   . Hypertension Father   . Heart attack Father   . Cancer Father     unknown type cancer   . Heart disease Mother   . Anemia Mother   . Diabetes Neg Hx     ALLERGIES:  is allergic to actos [pioglitazone] and sulfa antibiotics.  MEDICATIONS:  Current Outpatient Prescriptions  Medication Sig Dispense Refill  . acetaminophen (TYLENOL) 325 MG tablet Take 2 tablets (650 mg total) by mouth every 6 (six) hours as needed. 60 tablet 0  . amLODipine (NORVASC) 5 MG tablet Take 10 mg by mouth daily.     . aspirin EC 81 MG tablet Take 81 mg by mouth  daily.    . atorvastatin (LIPITOR) 20 MG tablet Take 20 mg by mouth daily.    . Calcium Carbonate-Vitamin D (CALCIUM + D PO) Take 600 mg by mouth daily.    . ferrous sulfate 325 (65 FE) MG tablet Take 325 mg by mouth daily with breakfast.    . furosemide (LASIX) 20 MG tablet Take 20 mg by mouth daily as needed (leg swelling).     . glucose blood (ACCU-CHEK AVIVA PLUS) test strip Check upto 2x daily 100 each 3  . ibuprofen (ADVIL,MOTRIN) 200 MG tablet Take 200-400 mg by mouth every 6 (six) hours as needed for headache.    . losartan (COZAAR) 100 MG tablet Take 100 mg by mouth every morning.    .   metFORMIN (GLUCOPHAGE) 1000 MG tablet Take 1,000 mg by mouth 2 (two) times daily with a meal.    . omeprazole (PRILOSEC OTC) 20 MG tablet Take 20 mg by mouth every morning.     . polyethylene glycol (MIRALAX / GLYCOLAX) packet Take 17 g by mouth daily as needed.    . potassium chloride (K-DUR) 10 MEQ tablet Take 10 mEq by mouth daily.    . predniSONE (DELTASONE) 1 MG tablet Take 1 mg by mouth every morning. Take with 50m tablet to make 639m   . predniSONE (DELTASONE) 5 MG tablet Take 5 mg by mouth every morning. Take with 10m47mablet to make 6mg37m . repaglinide (PRANDIN) 1 MG tablet TAKE 1 TABLET BEFORE BREAKFAST AND LUNCH AND TAKE 2 TABLETS BEFORE SUPPER 30 tablet 0  . trimethoprim (TRIMPEX) 100 MG tablet Take 100 mg by mouth daily as needed (for irritation (uti) take for abour 3-4 days).     . VIMarland KitchenAMIN B1-B12 IM Inject 1,000 mcg/mL into the muscle every 30 (thirty) days.     No current facility-administered medications for this visit.     REVIEW OF SYSTEMS:   Constitutional: Denies fevers, chills or abnormal night sweats Eyes: Denies blurriness of vision, double vision or watery eyes Ears, nose, mouth, throat, and face: Denies mucositis or sore throat Respiratory: Denies cough, dyspnea or wheezes Cardiovascular: Denies palpitation, chest discomfort or lower extremity swelling Gastrointestinal:   Denies nausea, heartburn or change in bowel habits Skin: Denies abnormal skin rashes Lymphatics: Denies new lymphadenopathy or easy bruising Neurological: (+) Numbness of right arm and two fingers of right hand since IV iron Behavioral/Psych: Mood is stable, no new changes  All other systems were reviewed with the patient and are negative.  PHYSICAL EXAMINATION: ECOG PERFORMANCE STATUS: 3 - Symptomatic, >50% confined to bed  Vitals:   11/04/16 1122  BP: (!) 143/54  Pulse: 66  Resp: 18  Temp: 97.7 F (36.5 C)   Filed Weights   11/04/16 1122  Weight: 145 lb 11.2 oz (66.1 kg)    GENERAL:alert, no distress and comfortable SKIN: skin color, texture, turgor are normal, no rashes or significant lesions EYES: normal, conjunctiva are pink and non-injected, sclera clear OROPHARYNX:no exudate, no erythema and lips, buccal mucosa, and tongue normal  NECK: supple, thyroid normal size, non-tender, without nodularity LYMPH:  no palpable lymphadenopathy in the cervical, axillary or inguinal LUNGS: clear to auscultation and percussion with normal breathing effort HEART: regular rate & rhythm and no murmurs and no lower extremity edema ABDOMEN:abdomen soft, non-tender and normal bowel sounds Musculoskeletal:no cyanosis of digits and no clubbing  PSYCH: alert & oriented x 3 with fluent speech NEURO: (+) Decreased sensation of the right thumb and index finger. Otherwise no weakness.  LABORATORY DATA:  I have reviewed the data as listed CBC Latest Ref Rng & Units 09/28/2016 08/18/2016 07/21/2016  WBC 4.0 - 10.5 K/uL 13.3(H) 10.3 8.8  Hemoglobin 12.0 - 15.0 g/dL 14.2 11.3(L) 10.9(L)  Hematocrit 36.0 - 46.0 % 42.0 33.2(L) 34.0(L)  Platelets 150 - 400 K/uL 137(L) 214 238    CMP Latest Ref Rng & Units 09/28/2016 07/21/2016 07/21/2016  Glucose 65 - 99 mg/dL 248(H) 453(H) -  BUN 6 - 20 mg/dL 11 11.7 -  Creatinine 0.44 - 1.00 mg/dL 0.78 0.9 -  Sodium 135 - 145 mmol/L 135 139 -  Potassium 3.5 - 5.1 mmol/L  3.1(L) 3.8 -  Chloride 101 - 111 mmol/L 100(L) - -  CO2 22 -  32 mmol/L 26 20(L) -  Calcium 8.9 - 10.3 mg/dL 8.5(L) 9.3 -  Total Protein 6.5 - 8.1 g/dL 5.7(L) 6.3(L) 6.0  Total Bilirubin 0.3 - 1.2 mg/dL 1.3(H) 0.77 -  Alkaline Phos 38 - 126 U/L 85 111 -  AST 15 - 41 U/L 14(L) 11 -  ALT 14 - 54 U/L 14 13 -    Results for LURDES, HALTIWANGER (MRN [id number]) as of 11/04/2016 11:55  Ref. Range 08/18/2016 14:07  Iron Latest Ref Range: 41 - 142 ug/dL 73  UIBC Latest Ref Range: 120 - 384 ug/dL 202  TIBC Latest Ref Range: 236 - 444 ug/dL 275  %SAT Latest Ref Range: 21 - 57 % 27  Ferritin Latest Ref Range: 9 - 269 ng/ml 686 (H)    Vitamin B12  Order: [id number]  Status:  Edited Result - FINAL  Visible to patient:  Yes (MyChart)  Next appt:  Today at 02:30 PM in [facility], Krista Blue, MD)   Ref Range & Units 34yrago  Vitamin B-12 211 - 911 pg/mL 208           RADIOGRAPHIC STUDIES: I have personally reviewed the radiological images as listed and agreed with the findings in the report. No results found.  ASSESSMENT & PLAN:  83y.o.  Caucasian female, with past medical history of poorly controlled diabetes, hypertension, chronic anemia, presnts to my clinic for her anemia management.   1. Microcytic anemia, secondary to iron deficiency and B12 deficiency -Her prior anemia workup showed low ferritin, low serum iron and transferrin saturation, this is consistent with anemia of iron deficiency. She also has a low B12 level, which contributes to her anemia. -She has family history of anemia in her mother, however her MCV was normal in 2014, this is unlikely thalassemia or other inheritable anemia. -She may have component of anemia of chronic disease due to her poorly controlled diabetes.  -I'll obtain SPEP with immunofixation to ruled out multiple myeloma. -We previously discussed other etiology of anemia, such as bone marrow disease. She has normal WBC and platelet count, my suspicion for MDS in  ulcer primary bone marrow disease is not very high, I do not think she needs bone marrow biopsy at this point. -We previously discussed the year etiology of iron deficiency. I suspect that this is likely slow GI bleeding, possible related to her long-term use of aspirin. She previously had EGD, but I don't know if she had colonoscopy. I'll contact Dr. EOletta Lamasto see if she had colonoscopy before, if not, I would recommend she undergo colonoscopy. Patient states she is due for colonoscopy again in 2022. -In light of right arm numbness after IV iron, the patient would like to continue with oral iron versus IV iron.   2. DM, HTN, arthralgia, chronic left leg pain -She will follow-up with her family care physician Dr. STamala Julian-I strongly encouraged her watch your diet, avoid soft drinks and sweets, and try to exercise  3. Numbness of right thumb and index finger in right hand -Patient notes numbness of the right arm and two fingers in right hand ever since latest IV iron -She has seen her PCP, orthopedics, and physical therapy which have not helped. She only attended two physical therapy sessions. She was not interested in needle injections with orthopedics. -Encouraged the patient to begin B complex supplement and to exercise her right hand and arm.  -She may need to see a neurologist if it persists   4. Financial concerns -  The patient would prefer fewer visits because she has to pay a co-pay for each visit. -She will continue to follow regularly with her PCP and will be see in our clinic intermittently  Plan  -She will continue with oral iron. -lab in 3 months and f/u in 6 months    All questions were answered. The patient knows to call the clinic with any problems, questions or concerns. I spent 25 minutes counseling the patient face to face. The total time spent in the appointment was 30 minutes and more than 50% was on counseling.  This document serves as a record of services personally  performed by Truitt Merle, MD. It was created on her behalf by Arlyce Harman, a trained medical scribe. The creation of this record is based on the scribe's personal observations and the provider's statements to them. This document has been checked and approved by the attending provider.    Truitt Merle, MD 11/04/16

## 2016-11-04 NOTE — Telephone Encounter (Signed)
Appointments scheduled per 4.20.18 LOS. Patient given AVS report and calendars with future scheduled appointments. °

## 2017-01-04 ENCOUNTER — Ambulatory Visit (INDEPENDENT_AMBULATORY_CARE_PROVIDER_SITE_OTHER): Payer: Medicare Other | Admitting: Endocrinology

## 2017-01-04 ENCOUNTER — Encounter: Payer: Self-pay | Admitting: Endocrinology

## 2017-01-04 VITALS — BP 140/74 | HR 64 | Ht 63.0 in | Wt 144.6 lb

## 2017-01-04 DIAGNOSIS — E1165 Type 2 diabetes mellitus with hyperglycemia: Secondary | ICD-10-CM | POA: Diagnosis not present

## 2017-01-04 DIAGNOSIS — R634 Abnormal weight loss: Secondary | ICD-10-CM | POA: Diagnosis not present

## 2017-01-04 MED ORDER — INSULIN ISOPHANE & REGULAR (HUMAN 70-30)100 UNIT/ML KWIKPEN: 10.0000 [IU] | PEN_INJECTOR | Freq: Two times a day (BID) | SUBCUTANEOUS | 0 refills | Status: DC

## 2017-01-04 NOTE — Patient Instructions (Addendum)
Check sugar 2x daily, some at bedtime  More water  No fried food  Take insulin 15-30 minutes before breakfast and suppertime, starting with 8 units twice a day  If you have a low sugar below 90  will need to drink juice or have glucose tablets to bring blood sugar up, let us know if you have a low sugar

## 2017-01-04 NOTE — Addendum Note (Signed)
Addended by: Elayne Snare on: 01/04/2017 08:39 PM   Modules accepted: Level of Service

## 2017-01-04 NOTE — Progress Notes (Addendum)
Patient ID: Nancy Glover, female   DOB: March 16, 1934, 83 y.o.   MRN: [id number]           Reason for Appointment: Follow-up for Type 2 Diabetes  Referring physician:Candace Tamala Julian  History of Present Illness:          Diagnosis: Type 2 diabetes mellitus, date of diagnosis: 2004       Past history: patient is unclear about the date and circumstances of her diagnosis. She probably has been on metformin since that time for diagnosis but not clear when her dose was increased to 1 g twice a day No detailed records of her previous level of control are available  She refuses to consider any brand-name medications because of cost; previously had been tried on Januvia with success  Recent history:   Current oral hypoglycemic drugs:Metformin 1000 mg a.m., 500 mg p.m.  A1c done by PCP was 11.3 in April and previously was 7.8 and progressively higher She has not been seen since February  Current management, blood sugar patterns and problems identified:  She said that she was not told what to do about her high A1c in April by her PCP  She also ran out of her Prandin sometime after her last visit and did not ask for a refill  More recently she says she has not used her blood sugar monitor as she thinks she was getting readings of 400-500 which she thought was from her meter malfunctioning  She thinks she may not have new test strips and not clear how old her test strips are, she did not bring her meter today  She has been losing weight and has lost about 11 pounds  She does not think she is excessively thirsty  Hypoglycemia:   none  Has seen the dietitian and was advised to make some changes She is not drinking  sweetened ice tea.   She is not watching fat intake especially since her husband is doing the cooking       Oral hypoglycemic drugs the patient is taking are: metformin 1 g twice a day, not taking Prandin 1 mg tid     Side effects from medications have been:none   Compliance  with the medical regimen: fair  Glucose monitoring:  less than once a day         Glucometer: Accucheck     Blood Glucose readings none recently, see above   Self-care: The diet that the patient has been following is: Variable Drinks occasional sweetened tea, usually in the early afternoon and has peanuts as snacks  Meals: 2 meals per day. Breakfast is cereal/oatmeal or biscuit; dinner 6 pm                Dietician visit, most recent:   10/2014.               Exercise: unable to do much, has leg pains     Weight history:  Wt Readings from Last 3 Encounters:  01/04/17 144 lb 9.6 oz (65.6 kg)  11/04/16 145 lb 11.2 oz (66.1 kg)  09/28/16 155 lb (70.3 kg)    Glycemic control:     Lab Results  Component Value Date   HGBA1C 11.3 10/26/2016   HGBA1C 8.2 (H) 11/24/2015   HGBA1C 7.8 09/04/2015   Lab Results  Component Value Date   MICROALBUR 2.9 (H) 09/04/2015   LDLCALC 64 03/04/2015   CREATININE 0.78 09/28/2016       Allergies as of 01/04/2017  Reactions   Actos [pioglitazone] Hives, Swelling   Body swelling   Sulfa Antibiotics Swelling   Tongue and mouth swelling      Medication List       Accurate as of 01/04/17  8:12 PM. Always use your most recent med list.          acetaminophen 325 MG tablet Commonly known as:  TYLENOL Take 2 tablets (650 mg total) by mouth every 6 (six) hours as needed.   amLODipine 5 MG tablet Commonly known as:  NORVASC Take 10 mg by mouth daily.   aspirin EC 81 MG tablet Take 81 mg by mouth daily.   atorvastatin 20 MG tablet Commonly known as:  LIPITOR Take 20 mg by mouth daily.   CALCIUM + D PO Take 600 mg by mouth daily.   ferrous sulfate 325 (65 FE) MG tablet Take 325 mg by mouth daily with breakfast.   furosemide 20 MG tablet Commonly known as:  LASIX Take 20 mg by mouth daily as needed (leg swelling).   glucose blood test strip Commonly known as:  ACCU-CHEK AVIVA PLUS Check upto 2x daily   ibuprofen 200 MG  tablet Commonly known as:  ADVIL,MOTRIN Take 200-400 mg by mouth every 6 (six) hours as needed for headache.   Insulin Isophane & Regular Human (70-30) 100 UNIT/ML PEN Commonly known as:  HUMULIN 70/30 KWIKPEN Inject 10 Units into the skin 2 (two) times daily before a meal.   losartan 100 MG tablet Commonly known as:  COZAAR Take 100 mg by mouth every morning.   metFORMIN 1000 MG tablet Commonly known as:  GLUCOPHAGE Take 1,000 mg by mouth 2 (two) times daily with a meal.   omeprazole 20 MG tablet Commonly known as:  PRILOSEC OTC Take 20 mg by mouth every morning.   potassium chloride 10 MEQ tablet Commonly known as:  K-DUR Take 10 mEq by mouth daily.   predniSONE 1 MG tablet Commonly known as:  DELTASONE Take 1 mg by mouth every morning. Take with 5mg  tablet to make 6mg    predniSONE 5 MG tablet Commonly known as:  DELTASONE Take 5 mg by mouth every morning. Take with 1mg  tablet to make 6mg    repaglinide 1 MG tablet Commonly known as:  PRANDIN TAKE 1 TABLET BEFORE BREAKFAST AND LUNCH AND TAKE 2 TABLETS BEFORE SUPPER   trimethoprim 100 MG tablet Commonly known as:  TRIMPEX Take 100 mg by mouth daily as needed (for irritation (uti) take for abour 3-4 days).   valsartan 320 MG tablet Commonly known as:  DIOVAN Take 320 mg by mouth daily.   VITAMIN B1-B12 IM Inject 1,000 mcg/mL into the muscle every 30 (thirty) days.       Allergies:  Allergies  Allergen Reactions  . Actos [Pioglitazone] Hives and Swelling    Body swelling  . Sulfa Antibiotics Swelling    Tongue and mouth swelling    Past Medical History:  Diagnosis Date  . Diabetes mellitus, type 2 ([place])   . Hyperlipidemia   . Hypertension   . Macular degeneration of left eye   . PMR (polymyalgia rheumatica) ([place]) 2003  . PONV (postoperative nausea and vomiting)   . Ulcer of right leg ([place])   . Venous insufficiency, peripheral     Past Surgical History:  Procedure Laterality Date  . CATARACT  EXTRACTION W/ INTRAOCULAR LENS IMPLANT Right   . CERVICAL BIOPSY  W/ LOOP ELECTRODE EXCISION  01-17-2001  DR LOMAX  . CYSTO/ HOD/ BLADDER  BX  10-23-2006  DR PETERSON  . CYSTOSCOPY WITH STENT PLACEMENT Left 11/24/2015   Procedure: CYSTOSCOPY WITH STENT PLACEMENT left ureter left retrograde pylegram;  Surgeon: Ardis Hughs, MD;  Location: [facility];  Service: Urology;  Laterality: Left;  . ESOPHAGOGASTRODUODENOSCOPY N/A 09/15/2012   Procedure: ESOPHAGOGASTRODUODENOSCOPY (EGD);  Surgeon: Winfield Cunas., MD;  Location: [facility];  Service: Endoscopy;  Laterality: N/A;  . EYE SURGERY     rt cataract  . FRACTURE SURGERY  1975   lt elbow-fx  . SKIN SPLIT GRAFT Left 04/19/2013   Procedure: SKIN GRAFT FULL THICKNESS TO LEFT LEG FROM ABDOMEN;  Surgeon: Pedro Earls, MD;  Location: [facility];  Service: General;  Laterality: Left;  . TONSILLECTOMY      Family History  Problem Relation Age of Onset  . Colon cancer Father   . Heart disease Father   . Hypertension Father   . Heart attack Father   . Cancer Father        unknown type cancer   . Heart disease Mother   . Anemia Mother   . Diabetes Neg Hx     Social History:  reports that she quit smoking about 25 years ago. Her smoking use included Cigarettes. She has a 40.00 pack-year smoking history. She has never used smokeless tobacco. She reports that she does not drink alcohol or use drugs.    Review of Systems    Labs: These were reviewed from the [place] Lipid history:  She has been on Lipitor 20 mg now, treated by PCP Last LDL was 55 in April       Lab Results  Component Value Date   CHOL 149 06/04/2015   HDL 57.50 06/04/2015   LDLCALC 64 03/04/2015   LDLDIRECT 72.0 09/04/2015   TRIG 281.0 (H) 06/04/2015   CHOLHDL 3 06/04/2015            Hypertension: present for several years and treated with losartan and amlodipine.  Appears well-controlled  This is being followed by PCP   Musculoskeletal:   She  has had PMR for the several years taking low dose prednisone, Continues to be on 6 mg.   Will occasionally take hydrocodone    Last diabetic foot exam done in 10/2014 which was normal   Physical Examination:  BP 140/74   Pulse 64   Ht 5\' 3"  (1.6 m)   Wt 144 lb 9.6 oz (65.6 kg)   SpO2 96%   BMI 25.61 kg/m    ASSESSMENT:  Diabetes type 2 with BMI 27 See history of present illness for detailed discussion of his current management, blood sugar patterns and problems identified  Her A1c was 11.3 and recent blood sugars are at least 300, even higher home reportedly She is losing weight from the hyperglycemia Most likely this represents insulin deficiency since stopping low dose Prandin should not cause marked hyperglycemia including fasting  Patient is very hesitant to start insulin which is recommended at her level of hyperglycemia Discussed that she is insulin-dependent for now especially with her glucose toxicity She cannot afford brand name medications anyway and probably will not be able to afford SGLT 2 inhibitors and GLP-1 drugs  Currently taking 1.5 g metformin only   PLAN:   Reassured patient that her monitor is probably accurate and she does have significant hyperglycemia in fact  She will need to get new test strips, old distress may be expired  Discussed importance of checking blood sugars  nonfasting including after supper also besides fasting which he normally does  About 30 minutes was spent with the patient and her husband and educating her about insulin, technique of using the pen injection, timing injection, the this on blood sugar, possible hyperglycemia and management as well as use of the insulin pen and storage  Continue 1500 mg of metformin  Reminded her to start cutting back on high-fat foods, does need further education including with dietitian if need be  Start insulin before breakfast and suppertime with 8 units of 70/30 insulin with the pen and  probably needs to adjust based on that sugar patterns  She will need to have more detailed education with nurse educator next week, this was not possible today  Follow-up in 3 weeks   Patient Instructions  Check sugar 2x daily, some at bedtime  More water  No fried food  Take insulin 15-30 minutes before breakfast and suppertime, starting with 8 units twice a day  If you have a low sugar below 90  will need to drink juice or have glucose tablets to bring blood sugar up, let us know if you have a low sugar    Counseling time on subjects discussed above is over 50% of today's 40 minute visit    Nancy Glover 01/04/2017, 8:12 PM   Note: This office note was prepared with Dragon voice recognition system technology. Any transcriptional errors that result from this process are unintentional.

## 2017-01-05 ENCOUNTER — Other Ambulatory Visit: Payer: Self-pay

## 2017-01-05 MED ORDER — GLUCOSE BLOOD VI STRP: ORAL_STRIP | 3 refills | Status: DC

## 2017-01-05 MED ORDER — INSULIN PEN NEEDLE 31G X 5 MM MISC: 3 refills | Status: DC

## 2017-01-10 ENCOUNTER — Encounter: Payer: Medicare Other | Attending: Endocrinology | Admitting: Nutrition

## 2017-01-10 DIAGNOSIS — E1100 Type 2 diabetes mellitus with hyperosmolarity without nonketotic hyperglycemic-hyperosmolar coma (NKHHC): Secondary | ICD-10-CM

## 2017-01-10 DIAGNOSIS — Z713 Dietary counseling and surveillance: Secondary | ICD-10-CM | POA: Insufficient documentation

## 2017-01-10 DIAGNOSIS — E1165 Type 2 diabetes mellitus with hyperglycemia: Secondary | ICD-10-CM | POA: Diagnosis not present

## 2017-01-10 NOTE — Patient Instructions (Signed)
Take 8u before breakfast and supper. Test blood sugar before breakfast and supper. Call tomorrow with blood sugar readings.

## 2017-01-10 NOTE — Progress Notes (Signed)
Patient is here with her husband.  They have not started th insulin, because husband had questions.   We reviewed how to prime the pen, mix the pen, attach the needle, where to inject, and how to rotate sites. They reported good understanding of these topics We also discussed low blood sugars--symptoms and treatments.  She did not test her blood sugars today, or yesterday.  She was told to do this every morning now, and also before supper.  She agreed to do this.  They were told to start this tonight, and to call me in the am with blood sugar readings.

## 2017-01-11 ENCOUNTER — Encounter: Payer: Medicare Other | Admitting: Nutrition

## 2017-01-27 ENCOUNTER — Encounter: Payer: Self-pay | Admitting: Endocrinology

## 2017-01-27 ENCOUNTER — Ambulatory Visit (INDEPENDENT_AMBULATORY_CARE_PROVIDER_SITE_OTHER): Payer: Medicare Other | Admitting: Endocrinology

## 2017-01-27 VITALS — BP 128/70 | HR 63 | Ht 63.0 in | Wt 147.8 lb

## 2017-01-27 DIAGNOSIS — E1165 Type 2 diabetes mellitus with hyperglycemia: Secondary | ICD-10-CM

## 2017-01-27 LAB — POCT GLYCOSYLATED HEMOGLOBIN (HGB A1C): Hemoglobin A1C: 9.7

## 2017-01-27 NOTE — Progress Notes (Signed)
Patient ID: Nancy Glover, female   DOB: 1934-02-10, 83 y.o.   MRN: [id number]           Reason for Appointment: Follow-up for Type 2 Diabetes  Referring physician:Candace Tamala Julian  History of Present Illness:          Diagnosis: Type 2 diabetes mellitus, date of diagnosis: 2004       Past history: patient is unclear about the date and circumstances of her diagnosis. She probably has been on metformin since that time for diagnosis but not clear when her dose was increased to 1 g twice a day No detailed records of her previous level of control are available  She refuses to consider any brand-name medications because of cost; previously had been tried on Januvia with success  Recent history:   Current oral hypoglycemic drugs:Metformin 1000 mg a.m., 500 mg p.m. INSULIN dose: Humulin 70/38 units twice a day  A1c done by PCP was 11.3 in April and previously was 7.8 and progressively higher  Current management, blood sugar patterns and problems identified:  She was started on insulin on her last visit in 6/18 because of marked hyperglycemia symptomatic with weight loss  After much instructions and training she was finally able to start doing injections with the help of her husband  She is able to inject them in the abdomen and has no difficulty except occasional bruising  She has also been fairly consistent with taking the injections twice a day  FASTING blood sugars are excellent now without any change in the dosage  Also taking metformin  She has not checked readings after her evening meal  Her blood sugars are higher in the daytime and her husband thinks this is from her drinking regular soft drinks throughout the day  Hypoglycemia:   none  Has seen the dietitian and was advised to make some changes She is  drinking  sweetened ice tea or other regular soft drinks.   She is not watching fat intake consistently; her husband is doing the cooking       Oral hypoglycemic drugs  the patient is taking are: metformin 1 g twice a day     Side effects from medications have been:none    Glucose monitoring:           Glucometer: Accucheck     Blood Glucose readings recently checked about once a day on an average  FASTING average 134 with the recent range of 112-212 The afternoon and evening before supper readings are averaging about 214 with a range of 133-299   Self-care: The diet that the patient has been following is: Variable Drinks occasional sweetened tea, usually in the early afternoon and has peanuts as snacks  Meals: 2 meals per day. Breakfast is cereal/oatmeal or biscuit; dinner 6 pm                Dietician visit, most recent:   10/2014.               Exercise: unable to do much, has leg pains     Weight history:  Wt Readings from Last 3 Encounters:  01/27/17 147 lb 12.8 oz (67 kg)  01/04/17 144 lb 9.6 oz (65.6 kg)  11/04/16 145 lb 11.2 oz (66.1 kg)    Glycemic control:     Lab Results  Component Value Date   HGBA1C 9.7 01/27/2017   HGBA1C 11.3 10/26/2016   HGBA1C 8.2 (H) 11/24/2015   Lab Results  Component Value  Date   MICROALBUR 2.9 (H) 09/04/2015   LDLCALC 64 03/04/2015   CREATININE 0.78 09/28/2016       Allergies as of 01/27/2017      Reactions   Actos [pioglitazone] Hives, Swelling   Body swelling   Sulfa Antibiotics Swelling   Tongue and mouth swelling      Medication List       Accurate as of 01/27/17 11:59 PM. Always use your most recent med list.          acetaminophen 325 MG tablet Commonly known as:  TYLENOL Take 2 tablets (650 mg total) by mouth every 6 (six) hours as needed.   amLODipine 5 MG tablet Commonly known as:  NORVASC Take 10 mg by mouth daily.   aspirin EC 81 MG tablet Take 81 mg by mouth daily.   atorvastatin 20 MG tablet Commonly known as:  LIPITOR Take 20 mg by mouth daily.   CALCIUM + D PO Take 600 mg by mouth daily.   ferrous sulfate 325 (65 FE) MG tablet Take 325 mg by mouth  daily with breakfast.   furosemide 20 MG tablet Commonly known as:  LASIX Take 20 mg by mouth daily as needed (leg swelling).   glucose blood test strip Commonly known as:  ACCU-CHEK AVIVA PLUS Check upto 2x daily   ibuprofen 200 MG tablet Commonly known as:  ADVIL,MOTRIN Take 200-400 mg by mouth every 6 (six) hours as needed for headache.   Insulin Isophane & Regular Human (70-30) 100 UNIT/ML PEN Commonly known as:  HUMULIN 70/30 KWIKPEN Inject 10 Units into the skin 2 (two) times daily before a meal.   Insulin Pen Needle 31G X 5 MM Misc Commonly known as:  B-D UF III MINI PEN NEEDLES Use on insulin pen to do 2 shots per day   losartan 100 MG tablet Commonly known as:  COZAAR Take 100 mg by mouth every morning.   metFORMIN 1000 MG tablet Commonly known as:  GLUCOPHAGE Take 1,000 mg by mouth 2 (two) times daily with a meal.   omeprazole 20 MG tablet Commonly known as:  PRILOSEC OTC Take 20 mg by mouth every morning.   potassium chloride 10 MEQ tablet Commonly known as:  K-DUR Take 10 mEq by mouth daily.   predniSONE 1 MG tablet Commonly known as:  DELTASONE Take 1 mg by mouth every morning. Take with 5mg  tablet to make 6mg    predniSONE 5 MG tablet Commonly known as:  DELTASONE Take 5 mg by mouth every morning. Take with 1mg  tablet to make 6mg    trimethoprim 100 MG tablet Commonly known as:  TRIMPEX Take 100 mg by mouth daily as needed (for irritation (uti) take for abour 3-4 days).   valsartan 320 MG tablet Commonly known as:  DIOVAN Take 320 mg by mouth daily.   VITAMIN B1-B12 IM Inject 1,000 mcg/mL into the muscle every 30 (thirty) days.       Allergies:  Allergies  Allergen Reactions  . Actos [Pioglitazone] Hives and Swelling    Body swelling  . Sulfa Antibiotics Swelling    Tongue and mouth swelling    Past Medical History:  Diagnosis Date  . Diabetes mellitus, type 2 ([place])   . Hyperlipidemia   . Hypertension   . Macular degeneration of  left eye   . PMR (polymyalgia rheumatica) ([place]) 2003  . PONV (postoperative nausea and vomiting)   . Ulcer of right leg ([place])   . Venous insufficiency, peripheral  Past Surgical History:  Procedure Laterality Date  . CATARACT EXTRACTION W/ INTRAOCULAR LENS IMPLANT Right   . CERVICAL BIOPSY  W/ LOOP ELECTRODE EXCISION  01-17-2001  DR LOMAX  . CYSTO/ HOD/ BLADDER BX  10-23-2006  DR PETERSON  . CYSTOSCOPY WITH STENT PLACEMENT Left 11/24/2015   Procedure: CYSTOSCOPY WITH STENT PLACEMENT left ureter left retrograde pylegram;  Surgeon: Ardis Hughs, MD;  Location: [facility];  Service: Urology;  Laterality: Left;  . ESOPHAGOGASTRODUODENOSCOPY N/A 09/15/2012   Procedure: ESOPHAGOGASTRODUODENOSCOPY (EGD);  Surgeon: Winfield Cunas., MD;  Location: [facility];  Service: Endoscopy;  Laterality: N/A;  . EYE SURGERY     rt cataract  . FRACTURE SURGERY  1975   lt elbow-fx  . SKIN SPLIT GRAFT Left 04/19/2013   Procedure: SKIN GRAFT FULL THICKNESS TO LEFT LEG FROM ABDOMEN;  Surgeon: Pedro Earls, MD;  Location: [facility];  Service: General;  Laterality: Left;  . TONSILLECTOMY      Family History  Problem Relation Age of Onset  . Colon cancer Father   . Heart disease Father   . Hypertension Father   . Heart attack Father   . Cancer Father        unknown type cancer   . Heart disease Mother   . Anemia Mother   . Diabetes Neg Hx     Social History:  reports that she quit smoking about 25 years ago. Her smoking use included Cigarettes. She has a 40.00 pack-year smoking history. She has never used smokeless tobacco. She reports that she does not drink alcohol or use drugs.    Review of Systems    Following is a copy of the previous note:    Labs: These were reviewed from the [place] Lipid history:  She has been on Lipitor 20 mg now, treated by PCP Last LDL was 55 in April       Lab Results  Component Value Date   CHOL 149 06/04/2015   HDL 57.50 06/04/2015    LDLCALC 64 03/04/2015   LDLDIRECT 72.0 09/04/2015   TRIG 281.0 (H) 06/04/2015   CHOLHDL 3 06/04/2015            Hypertension: present for several years and treated with losartan and amlodipine.  Appears well-controlled  This is being followed by PCP   Musculoskeletal:   She has had PMR for the several years taking low dose prednisone, Continues to be on 6 mg.   Will occasionally take hydrocodone    Last diabetic foot exam done in 10/2014 which was normal   Physical Examination:  BP 128/70   Pulse 63   Ht 5\' 3"  (1.6 m)   Wt 147 lb 12.8 oz (67 kg)   SpO2 96%   BMI 26.18 kg/m    ASSESSMENT:  Diabetes type 2 with BMI 27 See history of present illness for detailed discussion of his current management, blood sugar patterns and problems identified  Her A1c was 11.3 and she is now on premixed insulin twice a day Blood sugars are markedly improved, previously averaging mostly over 300 Weight has gone back up 3 pounds She has managed to take insulin without difficulty Also taking 1.5 g metformin as only non-insulin drug, does not want to consider brand-name drugs   PLAN:   No change in insulin as yet  She needs to start taking insulin RIGHT before a meal even when eating out and stopped taking it before leaving home  She will try  to check readings after evening meal to help identify postprandial  She will call if blood sugars are consistently over 200 after meals  Also she will continue to inject in her abdomen where she finds it more convenient and keep rotating sites  She will start cutting back on regular soft drinks and sweet tea   Patient Instructions  Check blood sugars on waking up  3/7 days  Also check blood sugars about 2 hours after a meal and do this after different meals by rotation  Recommended blood sugar levels on waking up is 90-130 and about 2 hours after meal is 130-180  Please bring your blood sugar monitor to each visit, thank you  Take insulin  right before meals        Masiyah Engen 01/29/2017, 9:26 PM   Note: This office note was prepared with Dragon voice recognition system technology. Any transcriptional errors that result from this process are unintentional.

## 2017-01-27 NOTE — Patient Instructions (Addendum)
Check blood sugars on waking up  3/7 days  Also check blood sugars about 2 hours after a meal and do this after different meals by rotation  Recommended blood sugar levels on waking up is 90-130 and about 2 hours after meal is 130-180  Please bring your blood sugar monitor to each visit, thank you  Take insulin right before meals

## 2017-02-02 ENCOUNTER — Other Ambulatory Visit: Payer: Self-pay | Admitting: Endocrinology

## 2017-02-02 ENCOUNTER — Other Ambulatory Visit (HOSPITAL_BASED_OUTPATIENT_CLINIC_OR_DEPARTMENT_OTHER): Payer: Medicare Other

## 2017-02-02 DIAGNOSIS — D509 Iron deficiency anemia, unspecified: Secondary | ICD-10-CM | POA: Diagnosis not present

## 2017-02-02 LAB — CBC & DIFF AND RETIC
BASO%: 0.2 % (ref 0.0–2.0)
BASOS ABS: 0 10*3/uL (ref 0.0–0.1)
EOS ABS: 0.2 10*3/uL (ref 0.0–0.5)
EOS%: 2.2 % (ref 0.0–7.0)
HEMATOCRIT: 41.7 % (ref 34.8–46.6)
HEMOGLOBIN: 13.8 g/dL (ref 11.6–15.9)
IMMATURE RETIC FRACT: 7.3 % (ref 1.60–10.00)
LYMPH%: 26.3 % (ref 14.0–49.7)
MCH: 29.4 pg (ref 25.1–34.0)
MCHC: 33.1 g/dL (ref 31.5–36.0)
MCV: 88.9 fL (ref 79.5–101.0)
MONO#: 1 10*3/uL — AB (ref 0.1–0.9)
MONO%: 10.8 % (ref 0.0–14.0)
NEUT#: 5.7 10*3/uL (ref 1.5–6.5)
NEUT%: 60.5 % (ref 38.4–76.8)
Platelets: 179 10*3/uL (ref 145–400)
RBC: 4.69 10*6/uL (ref 3.70–5.45)
RDW: 14.5 % (ref 11.2–14.5)
RETIC %: 1.29 % (ref 0.70–2.10)
RETIC CT ABS: 60.5 10*3/uL (ref 33.70–90.70)
WBC: 9.4 10*3/uL (ref 3.9–10.3)
lymph#: 2.5 10*3/uL (ref 0.9–3.3)

## 2017-02-02 LAB — IRON AND TIBC
%SAT: 33 % (ref 21–57)
IRON: 81 ug/dL (ref 41–142)
TIBC: 245 ug/dL (ref 236–444)
UIBC: 164 ug/dL (ref 120–384)

## 2017-02-02 LAB — FERRITIN: Ferritin: 144 ng/ml (ref 9–269)

## 2017-03-10 ENCOUNTER — Encounter: Payer: Self-pay | Admitting: Endocrinology

## 2017-03-10 ENCOUNTER — Ambulatory Visit (INDEPENDENT_AMBULATORY_CARE_PROVIDER_SITE_OTHER): Payer: Medicare Other | Admitting: Endocrinology

## 2017-03-10 VITALS — BP 128/74 | HR 66 | Ht 63.0 in | Wt 151.0 lb

## 2017-03-10 DIAGNOSIS — E1165 Type 2 diabetes mellitus with hyperglycemia: Secondary | ICD-10-CM

## 2017-03-10 LAB — BASIC METABOLIC PANEL
BUN: 12 mg/dL (ref 6–23)
CHLORIDE: 104 meq/L (ref 96–112)
CO2: 29 meq/L (ref 19–32)
Calcium: 9.1 mg/dL (ref 8.4–10.5)
Creatinine, Ser: 0.67 mg/dL (ref 0.40–1.20)
GFR: 89.71 mL/min (ref >60.00)
Glucose, Bld: 169 mg/dL — ABNORMAL HIGH (ref 70–99)
POTASSIUM: 3.5 meq/L (ref 3.5–5.1)
Sodium: 139 mEq/L (ref 135–145)

## 2017-03-10 NOTE — Patient Instructions (Addendum)
Check blood sugars on waking up  4/7 days  Also check blood sugars about 2 hours after a meal and do this after different meals by rotation  Recommended blood sugar levels on waking up is 90-130 and about 2 hours after meal is 130-160  Please bring your blood sugar monitor to each visit, thank you  Take 12 units in am and 8 in pm before supper  Call if sugar gets low below 80   Must take shot before meal

## 2017-03-10 NOTE — Progress Notes (Signed)
Patient ID: Nancy Glover, female   DOB: [birth year], 83 y.o.   MRN: [id number]           Reason for Appointment: Follow-up for Type 2 Diabetes  Referring physician:Candace Tamala Julian  History of Present Illness:          Diagnosis: Type 2 diabetes mellitus, date of diagnosis: 2004       Past history: patient is unclear about the date and circumstances of her diagnosis. She probably has been on metformin since that time for diagnosis but not clear when her dose was increased to 1 g twice a day No detailed records of her previous level of control are available  She refuses to consider any brand-name medications because of cost; previously had been tried on Januvia with success  Recent history:   Current oral hypoglycemic drugs:Metformin 1000 mg a.m., 500 mg p.m.  INSULIN dose: Humulin 70/30, 8 units twice a day  A1c done by PCP was 11.3 in April and previously was 7.8 and progressively higher  Current management, blood sugar patterns and problems identified:  She was started on insulin on her visit in 6/18 because of marked hyperglycemia symptomatic with weight loss  She is able to do her insulin injections as directed although now when she is eating out she may forget to take her insulin with her and will then take it after coming back  She generally has high readings in the afternoon except for 1 or 2 relatively good readings  Difficult to know which readings are before or after meals  She had been told on her last visit to cut back on regular soft drinks and she does not like water, she is not able to do this consistently at present  She is gaining back most of the weight that she had lost, previous weight 155  She says she tends to get hungry and may be snacking during the day also  Also asking about a Generic insulin but she does not want to use a syringe  Hypoglycemia:   none  Has seen the dietitian and was advised to make some changes She is  drinking  sweetened ice tea  or other regular soft drinks.   She is not watching fat intake consistently; her husband is doing the cooking       Oral hypoglycemic drugs the patient is taking are: metformin 1 g twice a day     Side effects from medications have been:none    Glucose monitoring:           Glucometer: Accucheck     Blood Glucose readings recently checked about once a day on an average  Mean values apply above for all meters except median for One Touch  PRE-MEAL Fasting Lunch 3-6 PM  Bedtime Overall  Glucose range: 95 ?    1 18-291     Mean/median: 134  208   154     Self-care: The diet that the patient has been following is: Variable Drinks occasional sweetened tea, usually in the early afternoon and has peanuts as snacks  Meals: 2 meals per day. Breakfast is cereal/oatmeal or biscuit; dinner 6 pm                Dietician visit, most recent:   10/2014.               Exercise: unable to do much, has leg pains     Weight history:  Wt Readings from Last 3 Encounters:  03/10/17 151 lb (68.5 kg)  01/27/17 147 lb 12.8 oz (67 kg)  01/04/17 144 lb 9.6 oz (65.6 kg)    Glycemic control:     Lab Results  Component Value Date   HGBA1C 9.7 01/27/2017   HGBA1C 11.3 10/26/2016   HGBA1C 8.2 (H) 11/24/2015   Lab Results  Component Value Date   MICROALBUR 2.9 (H) 09/04/2015   LDLCALC 64 03/04/2015   CREATININE 0.78 09/28/2016       Allergies as of 03/10/2017      Reactions   Actos [pioglitazone] Hives, Swelling   Body swelling   Sulfa Antibiotics Swelling   Tongue and mouth swelling      Medication List       Accurate as of 03/10/17  3:26 PM. Always use your most recent med list.          acetaminophen 325 MG tablet Commonly known as:  TYLENOL Take 2 tablets (650 mg total) by mouth every 6 (six) hours as needed.   amLODipine 5 MG tablet Commonly known as:  NORVASC Take 10 mg by mouth daily.   aspirin EC 81 MG tablet Take 81 mg by mouth daily.   atorvastatin 20 MG  tablet Commonly known as:  LIPITOR Take 20 mg by mouth daily.   CALCIUM + D PO Take 600 mg by mouth daily.   ferrous sulfate 325 (65 FE) MG tablet Take 325 mg by mouth daily with breakfast.   furosemide 20 MG tablet Commonly known as:  LASIX Take 20 mg by mouth daily as needed (leg swelling).   glucose blood test strip Commonly known as:  ACCU-CHEK AVIVA PLUS Check upto 2x daily   HUMULIN 70/30 KWIKPEN (70-30) 100 UNIT/ML PEN Generic drug:  Insulin Isophane & Regular Human INJECT 10 UNITS INTO THE SKIN 2 (TWO) TIMES DAILY BEFORE A MEAL.   ibuprofen 200 MG tablet Commonly known as:  ADVIL,MOTRIN Take 200-400 mg by mouth every 6 (six) hours as needed for headache.   Insulin Pen Needle 31G X 5 MM Misc Commonly known as:  B-D UF III MINI PEN NEEDLES Use on insulin pen to do 2 shots per day   metFORMIN 1000 MG tablet Commonly known as:  GLUCOPHAGE Take 1,000 mg by mouth 2 (two) times daily with a meal.   omeprazole 20 MG tablet Commonly known as:  PRILOSEC OTC Take 20 mg by mouth every morning.   potassium chloride 10 MEQ tablet Commonly known as:  K-DUR Take 10 mEq by mouth daily.   predniSONE 1 MG tablet Commonly known as:  DELTASONE Take 1 mg by mouth every morning. Take with 5mg  tablet to make 6mg    predniSONE 5 MG tablet Commonly known as:  DELTASONE Take 5 mg by mouth every morning. Take with 1mg  tablet to make 6mg    trimethoprim 100 MG tablet Commonly known as:  TRIMPEX Take 100 mg by mouth daily as needed (for irritation (uti) take for abour 3-4 days).   valsartan 320 MG tablet Commonly known as:  DIOVAN Take 320 mg by mouth daily.   VITAMIN B1-B12 IM Inject 1,000 mcg/mL into the muscle every 30 (thirty) days.            Discharge Care Instructions        Start     Ordered   01/27/17 5621  Basic metabolic panel     30/86/57 1456   01/27/17 0000  Fructosamine     01/27/17 1456      Allergies:  Allergies  Allergen Reactions  . Actos  [Pioglitazone] Hives and Swelling    Body swelling  . Sulfa Antibiotics Swelling    Tongue and mouth swelling    Past Medical History:  Diagnosis Date  . Diabetes mellitus, type 2 ([place])   . Hyperlipidemia   . Hypertension   . Macular degeneration of left eye   . PMR (polymyalgia rheumatica) ([place]) 2003  . PONV (postoperative nausea and vomiting)   . Ulcer of right leg ([place])   . Venous insufficiency, peripheral     Past Surgical History:  Procedure Laterality Date  . CATARACT EXTRACTION W/ INTRAOCULAR LENS IMPLANT Right   . CERVICAL BIOPSY  W/ LOOP ELECTRODE EXCISION  01-17-2001  DR LOMAX  . CYSTO/ HOD/ BLADDER BX  10-23-2006  DR PETERSON  . CYSTOSCOPY WITH STENT PLACEMENT Left 11/24/2015   Procedure: CYSTOSCOPY WITH STENT PLACEMENT left ureter left retrograde pylegram;  Surgeon: Ardis Hughs, MD;  Location: [facility];  Service: Urology;  Laterality: Left;  . ESOPHAGOGASTRODUODENOSCOPY N/A 09/15/2012   Procedure: ESOPHAGOGASTRODUODENOSCOPY (EGD);  Surgeon: Winfield Cunas., MD;  Location: [facility];  Service: Endoscopy;  Laterality: N/A;  . EYE SURGERY     rt cataract  . FRACTURE SURGERY  1975   lt elbow-fx  . SKIN SPLIT GRAFT Left 04/19/2013   Procedure: SKIN GRAFT FULL THICKNESS TO LEFT LEG FROM ABDOMEN;  Surgeon: Pedro Earls, MD;  Location: [facility];  Service: General;  Laterality: Left;  . TONSILLECTOMY      Family History  Problem Relation Age of Onset  . Colon cancer Father   . Heart disease Father   . Hypertension Father   . Heart attack Father   . Cancer Father        unknown type cancer   . Heart disease Mother   . Anemia Mother   . Diabetes Neg Hx     Social History:  reports that she quit smoking about 25 years ago. Her smoking use included Cigarettes. She has a 40.00 pack-year smoking history. She has never used smokeless tobacco. She reports that she does not drink alcohol or use drugs.    Review of Systems    Lipid history:    She has been on Lipitor 20 mg , treated by PCP Last LDL was 55 in April       Lab Results  Component Value Date   CHOL 149 06/04/2015   HDL 57.50 06/04/2015   LDLCALC 64 03/04/2015   LDLDIRECT 72.0 09/04/2015   TRIG 281.0 (H) 06/04/2015   CHOLHDL 3 06/04/2015            Hypertension: present for several years and treated with losartan and amlodipine.  Appears well-controlled  This is being followed by PCP   Musculoskeletal:   She has had PMR for the several years taking low dose prednisone, Continues to be on 6 mg.      Last diabetic foot exam done in 10/2016 which was normal   Physical Examination:  BP 128/74   Pulse 66   Ht 5\' 3"  (1.6 m)   Wt 151 lb (68.5 kg)   SpO2 96%   BMI 26.75 kg/m    ASSESSMENT:  Diabetes type 2 with BMI 27 See history of present illness for detailed discussion of his current management, blood sugar patterns and problems identified  Continues to take low-dose premixed insulin along with metformin Her blood sugars are still not able to control She has relatively  good readings in the mornings but mostly high readings later in the day when she checks them Some of her readings are after eating and not taking her insulin Also has high readings because of drinking regular soft drinks during the day   PLAN:   Since her sugars are mostly high later in the day she will need to increase her morning dose to 12 units  Discussed need to take her insulin right before eating or at least it in 30 minutes before the meal for better efficacy  She will take her insulin with her when she is eating out  Since her fasting readings are not below 100 usually she will not change her evening dose  However needs to do some readings after breakfast and evening meals to help adjust her insulin regimen  Considering her age her blood sugar targets did not need to be very tight  Discussed that she is going to gain weight if she continues to drink regular soft  drinks  Also may benefit from consultation with dietitian  A1c to be checked on the next visit, fructosamine to be checked today   Patient Instructions  Check blood sugars on waking up  4/7 days  Also check blood sugars about 2 hours after a meal and do this after different meals by rotation  Recommended blood sugar levels on waking up is 90-130 and about 2 hours after meal is 130-160  Please bring your blood sugar monitor to each visit, thank you  Take 12 units in am and 8 in pm before supper  Call if sugar gets low below 80   Must take shot before meal      Perry Brucato 03/10/2017, 3:26 PM   Note: This office note was prepared with Dragon voice recognition system technology. Any transcriptional errors that result from this process are unintentional.

## 2017-03-15 ENCOUNTER — Telehealth: Payer: Self-pay | Admitting: Endocrinology

## 2017-03-15 ENCOUNTER — Telehealth: Payer: Self-pay

## 2017-03-15 NOTE — Telephone Encounter (Signed)
Called patient, LVM to call back regarding questions. Left call back number.

## 2017-03-15 NOTE — Telephone Encounter (Signed)
Routing to you °

## 2017-03-15 NOTE — Telephone Encounter (Signed)
Patient called in reference to having questions about her insulin. Please call patient and advise. Patient stated she is about to go in the "donut hole" and wanted to speak about that as well. Please call patient and advise. OK to leave message.

## 2017-03-16 LAB — FRUCTOSAMINE: FRUCTOSAMINE: 286 umol/L — AB (ref 0–285)

## 2017-03-16 NOTE — Telephone Encounter (Signed)
Patient returning call. Patient stated that she is going out of town until Sunday. I advised her that the office is closed Monday and that the nurse will either leave her a detailed message on tomorrow or call her Tuesday. Please advise.

## 2017-03-16 NOTE — Telephone Encounter (Signed)
Patient leaving on vacation tomorrow and will return Sunday Sept 2nd.   -LL

## 2017-03-16 NOTE — Telephone Encounter (Signed)
See message and please advise on how to proceed. Please send message back to Nancy Glover, I will not be in the office tomorrow.

## 2017-03-16 NOTE — Telephone Encounter (Signed)
She can change to Humulin 70/30 pen.  Otherwise she will have to take the NovoLog 70/30 from [place] in a bottle syringe,on her last visit and she did not prefer to do this

## 2017-03-16 NOTE — Telephone Encounter (Signed)
Patient calling again b/c she can not afford insulin shots. It was $508.31.  She takes Humulin 70/30 Kwikpen (70-30) 100 Unit/ML PEN.  Please advise.  Ty,  -LL

## 2017-03-16 NOTE — Telephone Encounter (Signed)
Routing to you °

## 2017-03-16 NOTE — Telephone Encounter (Signed)
Patient calling RE questions she had.  See notes below.  -LL

## 2017-03-16 NOTE — Telephone Encounter (Signed)
Attempted to reach the patient but patient was not available. Patient advised of MD's message and was advised to call back to let us know how she would like to proceed.

## 2017-03-17 ENCOUNTER — Other Ambulatory Visit: Payer: Self-pay

## 2017-03-17 MED ORDER — INSULIN ISOPHANE & REGULAR (HUMAN 70-30)100 UNIT/ML KWIKPEN: 10.0000 [IU] | PEN_INJECTOR | Freq: Two times a day (BID) | SUBCUTANEOUS | 2 refills | Status: AC

## 2017-03-17 NOTE — Telephone Encounter (Signed)
Please see recommendations below.

## 2017-03-17 NOTE — Telephone Encounter (Signed)
Routing to you °

## 2017-03-17 NOTE — Telephone Encounter (Signed)
This has been ordered 

## 2017-03-17 NOTE — Telephone Encounter (Signed)
Patient calling again regarding insulin. Patient stated she needs insulin ASAP due to going out of town today. Please call patient and advise. Patient also stated she never received phone call yesterday.

## 2017-03-21 NOTE — Telephone Encounter (Signed)
Spoke to the patient this morning and she is going to find out from the pharmacy how much the Humalin 70/30 costs before she picks it up today but if it still too expensive she will be calling back to request something else

## 2017-03-23 ENCOUNTER — Other Ambulatory Visit: Payer: Self-pay | Admitting: Hematology

## 2017-03-23 ENCOUNTER — Telehealth: Payer: Self-pay | Admitting: *Deleted

## 2017-03-23 MED ORDER — TRAMADOL HCL 50 MG PO TABS: 50.0000 mg | ORAL_TABLET | Freq: Four times a day (QID) | ORAL | 0 refills | Status: AC | PRN

## 2017-03-23 NOTE — Telephone Encounter (Signed)
Pt wants to talk to Dr about  right   hand and fingers that are very painful and swollen.  She is not able to use that hand and she is right-handed.  She has taken no meds for relief and her pain level is around 8.  She stated that the pain and some numbness in her thumb began after taking the iron tx in her arm in Feb. Routed to Dr Burr Medico  Pt callback # [phone]

## 2017-03-23 NOTE — Telephone Encounter (Signed)
I called pt back and spoke with her for a while. Her last RVR and was in Fab[place] 2018, she has had recurrent right some swollen and pain since then. She saw his rate to her IV iron, which I think is less likely. She does not want more IV iron, which I agree. Her last lab results was normal, I reviewed with her. I called in tramadol 50 mg every 6 hours as needed for 15 tablets for her pain control. I strongly encouraged her to call her primary care physician's office and get urgent follow-up appointment. She agrees.  Truitt Merle MD

## 2017-03-24 ENCOUNTER — Telehealth: Payer: Self-pay | Admitting: Endocrinology

## 2017-03-24 NOTE — Telephone Encounter (Signed)
Routing to you °

## 2017-03-24 NOTE — Telephone Encounter (Signed)
Called patient and she told me that this only happened the first night she took the Humulin 70/30 insulin and she stated that she did not think to check her blood sugar when she felt this way. I advised to her to make sure if this happens again to check her blood sugar to make sure she was not having a low reaction. She also stated that she took the insulin and ate dinner later than normal. I also advised to her to make sure she carried some glucose tablets to keep with her at all times. She stated that this has not happened today but she does not feel well and her blood sugar was 109 this morning. Patient will call back if she has any more symptoms or if she feels worse.

## 2017-03-24 NOTE — Telephone Encounter (Signed)
Patient called to advise that she had broke out in sweats last night after taking Insulin Isophane & Regular Human (HUMULIN 70/30 KWIKPEN) (70-30) 100 UNIT/ML PEN. Call patient to advise as soon as possible.

## 2017-04-26 ENCOUNTER — Telehealth: Payer: Self-pay | Admitting: Endocrinology

## 2017-04-26 NOTE — Telephone Encounter (Signed)
Called patient and let her know the message from Dr. Dwyane Dee and she stated that she is going to see her PCP tomorrow and that her PCP did put her on a new medication about a week ago and this started with the new medication.

## 2017-04-26 NOTE — Telephone Encounter (Signed)
Her blood sugar is not appearing to be low; she needs to talk to her PCP

## 2017-04-26 NOTE — Telephone Encounter (Signed)
Patient calling to report she has been experiencing confusion for the last 1 week.  She thinks it may be related to her insulin.  She is requesting call back from Dr. Ronnie Derby nurse.

## 2017-04-26 NOTE — Telephone Encounter (Signed)
Called patient and she stated that she is having confusion when she goes to the bathroom during the night and she is not able to find the bathroom. She stated that she eventually will find it to go urinate but this has been going on for about a week and she is very concerned. She stated that her blood sugars are around 141 after supper and around 139 in the morning when she wakes up. She is concerned why this is happening and what can be done to prevent this? Please advise.

## 2017-05-01 ENCOUNTER — Telehealth: Payer: Self-pay

## 2017-05-01 ENCOUNTER — Telehealth: Payer: Self-pay | Admitting: Endocrinology

## 2017-05-01 ENCOUNTER — Other Ambulatory Visit: Payer: Self-pay | Admitting: Endocrinology

## 2017-05-01 DIAGNOSIS — E1165 Type 2 diabetes mellitus with hyperglycemia: Secondary | ICD-10-CM

## 2017-05-01 DIAGNOSIS — Z794 Long term (current) use of insulin: Principal | ICD-10-CM

## 2017-05-01 NOTE — Telephone Encounter (Signed)
Please let her know that her disorientation is not related to the blood sugar level; she has had higher blood sugars than this before and she needs to see her PCP right away

## 2017-05-01 NOTE — Telephone Encounter (Signed)
She needs to increase supper dose from 8 to 10 units and take it daily before eating, also see me next week. Will also have her see the dietician to regulate her diet

## 2017-05-01 NOTE — Telephone Encounter (Signed)
Called patient and spoke to her husband and this is the same as previous note. Please see previous note.

## 2017-05-01 NOTE — Telephone Encounter (Signed)
Patient husband called ask you to call them, wife is having problems with b/s

## 2017-05-01 NOTE — Telephone Encounter (Signed)
Patient called stating she is having trouble in the middle of the night when she wakes up to use the restroom she is getting disoriented and can not find it she has noticed that when this happens and she tests her blood sugar it is really high- patient seemed confused and had trouble finding the readings on her meter   Blood sugar readings that I was able to get: 04/26/17 10 AM- 144 04/28/17 2 PM- 198 04/30/17 middle of the night-352 05/01/17 9 AM- 213

## 2017-05-01 NOTE — Telephone Encounter (Signed)
Called patient and spoke with her husband and he said the night before last she is not able to find the bathroom and is not sure where she is and the night before last her blood sugar was 361 when they checked when she was up at 2:30am. This has been going on for about a week and patient stated that everything looks foggy when she is up at night and this is happening?  I advised them to see her PCP and she stated that they saw her PCP, Dr. Alben Spittle at [facility] last week and the doctor asked her to check her blood sugar readings during the night.  She could possibly have ketones??  Please advise.

## 2017-05-02 NOTE — Telephone Encounter (Signed)
Called patient and they stated that she is already taking 10 units at supper and also takes 10 units at breakfast. Patient stated that this morning her blood sugar was 105 around 9am and she did okay last night. She will come to see [place] on Friday.

## 2017-05-03 ENCOUNTER — Encounter: Payer: Self-pay | Admitting: Endocrinology

## 2017-05-03 ENCOUNTER — Ambulatory Visit (INDEPENDENT_AMBULATORY_CARE_PROVIDER_SITE_OTHER): Payer: Medicare Other | Admitting: Endocrinology

## 2017-05-03 VITALS — BP 126/68 | HR 73 | Ht 63.0 in | Wt 152.0 lb

## 2017-05-03 DIAGNOSIS — E1165 Type 2 diabetes mellitus with hyperglycemia: Secondary | ICD-10-CM

## 2017-05-03 DIAGNOSIS — Z794 Long term (current) use of insulin: Secondary | ICD-10-CM

## 2017-05-03 NOTE — Patient Instructions (Addendum)
Check blood sugars on waking up  3/7 days  Also check blood sugars about 2 hours after a meal and do this after different meals by rotation  Recommended blood sugar levels on waking up is 90-130 and about 2 hours after meal is 130-180  Please bring your blood sugar monitor to each visit, thank you  Metformin 1/2 pill with each meal  At breakfast add an egg or protein   Insulin 8 units at each meal

## 2017-05-03 NOTE — Progress Notes (Signed)
Patient ID: Nancy Glover, female   DOB: 1934-04-30, 83 y.o.   MRN: [id number]           Reason for Appointment: Follow-up for Type 2 Diabetes  Referring physician:Candace Tamala Julian  History of Present Illness:          Diagnosis: Type 2 diabetes mellitus, date of diagnosis: 2004       Past history: patient is unclear about the date and circumstances of Nancy Glover diagnosis. Nancy Glover probably has been on metformin since that time for diagnosis but not clear when Nancy Glover dose was increased to 1 g twice a day No detailed records of Nancy Glover previous level of control are available  Nancy Glover refuses to consider any brand-name medications because of cost; previously had been tried on Januvia with success Nancy Glover was started on insulin on Nancy Glover visit in 6/18 because of marked hyperglycemia symptomatic with weight loss  Recent history:   Current oral hypoglycemic drugs:Metformin 1000 mg a.m., 1000 mg p.m.  INSULIN dose: Humulin 70/30, 10 units twice a day  A1c done in July was 7.3, previously by PCP was 11.3 in April  Current management, blood sugar patterns and problems identified:  Nancy Glover came in as a walk in today because of problems with episodes of nocturnal confusion and inconsistent blood sugar control despite several phone calls recently  Nancy Glover is trying to take Nancy Glover insulin before eating in the morning and evening although not always when Nancy Glover is eating out in the evening  Difficult to get good pattern of Nancy Glover blood sugars as Nancy Glover has irregular mealtimes during the day and does not always take Nancy Glover insulin before eating  Today Nancy Glover blood sugar was well over 300 because Nancy Glover had a neck to meal at lunchtime which consisted mostly of sweet tea, high carbohydrate meals, dessert  Nancy Glover does not appear to be paying attention to what Nancy Glover is eating and regularly eats high carbohydrate or high sugar foods and drinks various sweet drinks  Nancy Glover has a few readings recently the evening possibly after supper are mostly high and generally  over 200  Nancy Glover can explain by Nancy Glover sugar was 361 on Saturday night at 3 AM when Nancy Glover was a little confused  Nancy Glover is eating only cereal in the morning without any protein  Also asking about a Generic insulin since Nancy Glover is in the doughnut hole but Nancy Glover does not want to use a syringe with the [employer] brand insulin  Nancy Glover also says that Nancy Glover has some nausea or upset stomach possibly taking metformin  Hypoglycemia:   none  Has seen the dietitian and was advised to make some changes Nancy Glover is  drinking  sweetened ice tea or other regular soft drinks.   Nancy Glover is not watching fat intake consistently; Nancy Glover husband is doing the cooking          Side effects from medications have been:?  Nausea from high dose metformin    Glucose monitoring:           Glucometer: Accucheck     Blood Glucose readings recently checked about once a day on an average  Mean values apply above for all meters except median for One Touch  PRE-MEAL Fasting Lunch 3-6 PM  Bedtime Overall  Glucose range: 95 ?    1 18-291     Mean/median: 134  208   154     Self-care: The diet that the patient has been following is: Variable Drinks occasional sweetened tea, usually in the early afternoon and  has peanuts as snacks  Meals: 2 meals per day. Breakfast is cereal/oatmeal or biscuit; dinner Variable, sometimes 6 pm                Dietician visit, most recent:   10/2014.               Exercise: unable to do much, has leg pains     Weight history:  Wt Readings from Last 3 Encounters:  05/03/17 152 lb (68.9 kg)  03/10/17 151 lb (68.5 kg)  01/27/17 147 lb 12.8 oz (67 kg)    Glycemic control:     Lab Results  Component Value Date   HGBA1C 9.7 01/27/2017   HGBA1C 11.3 10/26/2016   HGBA1C 8.2 (H) 11/24/2015   Lab Results  Component Value Date   MICROALBUR 2.9 (H) 09/04/2015   LDLCALC 64 03/04/2015   CREATININE 0.67 03/10/2017       Allergies as of 05/03/2017      Reactions   Actos [pioglitazone] Hives, Swelling   Body  swelling   Sulfa Antibiotics Swelling   Tongue and mouth swelling      Medication List       Accurate as of 05/03/17  4:03 PM. Always use your most recent med list.          acetaminophen 325 MG tablet Commonly known as:  TYLENOL Take 2 tablets (650 mg total) by mouth every 6 (six) hours as needed.   amLODipine 5 MG tablet Commonly known as:  NORVASC Take 10 mg by mouth daily.   aspirin EC 81 MG tablet Take 81 mg by mouth daily.   atorvastatin 20 MG tablet Commonly known as:  LIPITOR Take 20 mg by mouth daily.   CALCIUM + D PO Take 600 mg by mouth daily.   ferrous sulfate 325 (65 FE) MG tablet Take 325 mg by mouth daily with breakfast.   furosemide 20 MG tablet Commonly known as:  LASIX Take 20 mg by mouth daily as needed (leg swelling).   glucose blood test strip Commonly known as:  ACCU-CHEK AVIVA PLUS Check upto 2x daily   ibuprofen 200 MG tablet Commonly known as:  ADVIL,MOTRIN Take 200-400 mg by mouth every 6 (six) hours as needed for headache.   Insulin Isophane & Regular Human (70-30) 100 UNIT/ML PEN Commonly known as:  HUMULIN 70/30 KWIKPEN Inject 10 Units into the skin 2 (two) times daily before a meal.   Insulin Pen Needle 31G X 5 MM Misc Commonly known as:  B-D UF III MINI PEN NEEDLES Use on insulin pen to do 2 shots per day   metFORMIN 1000 MG tablet Commonly known as:  GLUCOPHAGE Take 1,000 mg by mouth 2 (two) times daily with a meal.   omeprazole 20 MG tablet Commonly known as:  PRILOSEC OTC Take 20 mg by mouth every morning.   potassium chloride 10 MEQ tablet Commonly known as:  K-DUR Take 10 mEq by mouth daily.   predniSONE 1 MG tablet Commonly known as:  DELTASONE Take 1 mg by mouth every morning. Take with 5mg  tablet to make 6mg    predniSONE 5 MG tablet Commonly known as:  DELTASONE Take 5 mg by mouth every morning. Take with 1mg  tablet to make 6mg    traMADol 50 MG tablet Commonly known as:  ULTRAM Take 1 tablet (50 mg  total) by mouth every 6 (six) hours as needed.   trimethoprim 100 MG tablet Commonly known as:  TRIMPEX Take 100 mg by mouth  daily as needed (for irritation (uti) take for abour 3-4 days).   valsartan 320 MG tablet Commonly known as:  DIOVAN Take 320 mg by mouth daily.   VITAMIN B1-B12 IM Inject 1,000 mcg/mL into the muscle every 30 (thirty) days.       Allergies:  Allergies  Allergen Reactions  . Actos [Pioglitazone] Hives and Swelling    Body swelling  . Sulfa Antibiotics Swelling    Tongue and mouth swelling    Past Medical History:  Diagnosis Date  . Diabetes mellitus, type 2 ([place])   . Hyperlipidemia   . Hypertension   . Macular degeneration of left eye   . PMR (polymyalgia rheumatica) ([place]) 2003  . PONV (postoperative nausea and vomiting)   . Ulcer of right leg ([place])   . Venous insufficiency, peripheral     Past Surgical History:  Procedure Laterality Date  . CATARACT EXTRACTION W/ INTRAOCULAR LENS IMPLANT Right   . CERVICAL BIOPSY  W/ LOOP ELECTRODE EXCISION  01-17-2001  DR LOMAX  . CYSTO/ HOD/ BLADDER BX  10-23-2006  DR PETERSON  . CYSTOSCOPY WITH STENT PLACEMENT Left 11/24/2015   Procedure: CYSTOSCOPY WITH STENT PLACEMENT left ureter left retrograde pylegram;  Surgeon: Ardis Hughs, MD;  Location: [facility];  Service: Urology;  Laterality: Left;  . ESOPHAGOGASTRODUODENOSCOPY N/A 09/15/2012   Procedure: ESOPHAGOGASTRODUODENOSCOPY (EGD);  Surgeon: Winfield Cunas., MD;  Location: [facility];  Service: Endoscopy;  Laterality: N/A;  . EYE SURGERY     rt cataract  . FRACTURE SURGERY  1975   lt elbow-fx  . SKIN SPLIT GRAFT Left 04/19/2013   Procedure: SKIN GRAFT FULL THICKNESS TO LEFT LEG FROM ABDOMEN;  Surgeon: Pedro Earls, MD;  Location: [facility];  Service: General;  Laterality: Left;  . TONSILLECTOMY      Family History  Problem Relation Age of Onset  . Colon cancer Father   . Heart disease Father   . Hypertension Father   .  Heart attack Father   . Cancer Father        unknown type cancer   . Heart disease Mother   . Anemia Mother   . Diabetes Neg Hx     Social History:  reports that Nancy Glover quit smoking about 25 years ago. Nancy Glover smoking use included Cigarettes. Nancy Glover has a 40.00 pack-year smoking history. Nancy Glover has never used smokeless tobacco. Nancy Glover reports that Nancy Glover does not drink alcohol or use drugs.    Review of Systems   For the last 2 weeks Nancy Glover reports periodic confusion during the night when Nancy Glover could not find Nancy Glover way to the bathroom at home and may be a little disoriented but this is not happening during the daytime Nancy Glover has checked Nancy Glover blood sugar only once at this time and it was significantly high Nancy Glover already has discussed with PCP and no explanation final   Lipid history:  Nancy Glover has been on Lipitor 20 mg , treated by PCP Last LDL was 55 in April       Lab Results  Component Value Date   CHOL 149 06/04/2015   HDL 57.50 06/04/2015   LDLCALC 64 03/04/2015   LDLDIRECT 72.0 09/04/2015   TRIG 281.0 (H) 06/04/2015   CHOLHDL 3 06/04/2015            Hypertension: present for several years and treated with losartan and amlodipine.  Appears well-controlled  This is being followed by PCP   Musculoskeletal:   Nancy Glover has had PMR  for the several years taking low dose prednisone, Continues to be on 6 mg.      Last diabetic foot exam done in 10/2016 which was normal   Physical Examination:  BP 126/68   Pulse 73   Ht 5\' 3"  (1.6 m)   Wt 152 lb (68.9 kg)   SpO2 95%   BMI 26.93 kg/m    ASSESSMENT:  Diabetes type 2 with BMI 27 See history of present illness for detailed discussion of his current management, blood sugar patterns and problems identified  Continues to take low-dose premixed insulin twice a day along with metformin 1 g twice a day  Nancy Glover blood sugars are still not evenly controlled Nancy Glover has surprisingly good blood sugars in the mornings although still somewhat variable Blood sugars nonfasting  are generally high although Nancy Glover is not checking them as much as Nancy Glover needs to Some of Nancy Glover readings are after eating and not taking Nancy Glover insulin including today  Also has high readings because of drinking regular soft drinks, sweet tea and eating sweets or high carbohydrate meals without protein Difficult to know why Nancy Glover sugar was high during the night once since blood sugars are generally not has high after evening meal and may be related to inconsistent diet   PLAN:   Since Nancy Glover sugars are mostly high midday and afternoon Nancy Glover will need to start taking another injection at lunchtime starting with 8 units based on Nancy Glover meals  Also to avoid potential low blood sugars overnight Nancy Glover will reduce Nancy Glover evening dose to 8 units  Nancy Glover probably will do well  with taking only 8 units in the morning if Nancy Glover Will add some protein to breakfast and discussed options for low-fat protein sources    Nancy Glover will reduce Nancy Glover metformin to half tablet twice a day for now for better tolerability   Also Nancy Glover will benefit from consultation with dietitian, referral done  A1c to be checked on the next visit, fructosamine to be checked on the next visit also  Also discussed injection time especially when eating out, rotation of injection sites  Nancy Glover may consider the patient assistance program next year since Nancy Glover already has enough insulin for the next 2 months    Patient Instructions  Check blood sugars on waking up  3/7 days  Also check blood sugars about 2 hours after a meal and do this after different meals by rotation  Recommended blood sugar levels on waking up is 90-130 and about 2 hours after meal is 130-180  Please bring your blood sugar monitor to each visit, thank you  Metformin 1/2 pill with each meal  At breakfast add an egg or protein   Insulin 8 units at each meal     Counseling time on subjects discussed in assessment and plan sections is over 50% of today's 25 minute  visit   Garion Wempe 05/03/2017, 4:03 PM   Note: This office note was prepared with Estate agent. Any transcriptional errors that result from this process are unintentional.

## 2017-05-04 ENCOUNTER — Other Ambulatory Visit: Payer: Medicare Other

## 2017-05-05 ENCOUNTER — Ambulatory Visit: Payer: Medicare Other | Admitting: Endocrinology

## 2017-05-05 NOTE — Telephone Encounter (Signed)
Patient stated the insulin Humulin quick pen insulin is making her crazy, making her feel like she can do anything to her self. Please call her back

## 2017-05-05 NOTE — Telephone Encounter (Signed)
She did not mention anything about feeling foggy or sleepy after the injection 2 days ago, this is the same insulin she has been taking for some time.  What time of the day is she feeling this way?   Sometimes patients have blurred vision after increasing insulin and changing the insulin will not make any difference. She should talk to her primary care doctor about her confusion and sleepiness as it is not from insulin

## 2017-05-05 NOTE — Telephone Encounter (Signed)
She can have a prescription for NovoLog 70/30, 8 units before each meal

## 2017-05-05 NOTE — Telephone Encounter (Signed)
Called patient and she stated that she has already discussed this with her PCP and she was advised to contact you. She knows her symptoms are coming from the insulin and getting worse. This is the only new medication she has been on for a while now. She has these symptoms not long after taking the insulin injections and are happening with every injection. She had stopped drinking regular soft drinks and is eating better and healthier. Please advise.

## 2017-05-05 NOTE — Telephone Encounter (Signed)
Called patient and she stated that she has been feeling terrible since on the Humulin 70/30 Kwikpen and she stated that every time she takes this insulin she gets foggy when she is taking this medication and not able to see well after taking and she feels like she is going out of her mind and it is getting worse every time she takes it and she stated that she wants to go back on the original insulin that she first started with or if there is a different insulin that does not make her feel so bad when taking. Patient stated that she gets very sleepy when taking this insulin also. Her BS at 10:30am was 171 BS at 3:52pm was 230  Please advise.

## 2017-05-09 NOTE — Telephone Encounter (Signed)
Patient stop taking the medication, what do she do now?

## 2017-05-10 ENCOUNTER — Encounter (HOSPITAL_COMMUNITY): Payer: Self-pay | Admitting: *Deleted

## 2017-05-10 ENCOUNTER — Emergency Department (HOSPITAL_COMMUNITY)
Admission: EM | Admit: 2017-05-10 | Discharge: 2017-05-10 | Disposition: A | Discharge status: Home / Self Care | Payer: Medicare Other | Attending: Emergency Medicine | Admitting: Emergency Medicine

## 2017-05-10 DIAGNOSIS — R739 Hyperglycemia, unspecified: Secondary | ICD-10-CM | POA: Insufficient documentation

## 2017-05-10 DIAGNOSIS — N3000 Acute cystitis without hematuria: Secondary | ICD-10-CM | POA: Insufficient documentation

## 2017-05-10 DIAGNOSIS — H538 Other visual disturbances: Secondary | ICD-10-CM | POA: Diagnosis not present

## 2017-05-10 DIAGNOSIS — Z794 Long term (current) use of insulin: Secondary | ICD-10-CM | POA: Insufficient documentation

## 2017-05-10 DIAGNOSIS — E119 Type 2 diabetes mellitus without complications: Secondary | ICD-10-CM | POA: Diagnosis not present

## 2017-05-10 DIAGNOSIS — R4182 Altered mental status, unspecified: Secondary | ICD-10-CM | POA: Diagnosis present

## 2017-05-10 DIAGNOSIS — I10 Essential (primary) hypertension: Secondary | ICD-10-CM | POA: Insufficient documentation

## 2017-05-10 DIAGNOSIS — Z87891 Personal history of nicotine dependence: Secondary | ICD-10-CM | POA: Diagnosis not present

## 2017-05-10 LAB — COMPREHENSIVE METABOLIC PANEL
ALBUMIN: 3.3 g/dL — AB (ref 3.5–5.0)
ALT: 13 U/L — ABNORMAL LOW (ref 14–54)
ANION GAP: 7 (ref 5–15)
AST: 15 U/L (ref 15–41)
Alkaline Phosphatase: 79 U/L (ref 38–126)
BUN: 13 mg/dL (ref 6–20)
CO2: 25 mmol/L (ref 22–32)
Calcium: 9 mg/dL (ref 8.9–10.3)
Chloride: 103 mmol/L (ref 101–111)
Creatinine, Ser: 0.75 mg/dL (ref 0.44–1.00)
GFR calc non Af Amer: >60 mL/min (ref >60)
GLUCOSE: 254 mg/dL — AB (ref 65–99)
POTASSIUM: 3.7 mmol/L (ref 3.5–5.1)
SODIUM: 135 mmol/L (ref 135–145)
Total Bilirubin: 1.4 mg/dL — ABNORMAL HIGH (ref 0.3–1.2)
Total Protein: 6 g/dL — ABNORMAL LOW (ref 6.5–8.1)

## 2017-05-10 LAB — URINALYSIS, ROUTINE W REFLEX MICROSCOPIC
BILIRUBIN URINE: NEGATIVE
GLUCOSE, UA: NEGATIVE mg/dL
Hgb urine dipstick: NEGATIVE
KETONES UR: NEGATIVE mg/dL
Nitrite: POSITIVE — AB
PH: 5 (ref 5.0–8.0)
PROTEIN: 30 mg/dL — AB
Specific Gravity, Urine: 1.018 (ref 1.005–1.030)

## 2017-05-10 LAB — CBC
HEMATOCRIT: 41.1 % (ref 36.0–46.0)
HEMOGLOBIN: 13.9 g/dL (ref 12.0–15.0)
MCH: 29.6 pg (ref 26.0–34.0)
MCHC: 33.8 g/dL (ref 30.0–36.0)
MCV: 87.6 fL (ref 78.0–100.0)
Platelets: 181 10*3/uL (ref 150–400)
RBC: 4.69 MIL/uL (ref 3.87–5.11)
RDW: 13.8 % (ref 11.5–15.5)
WBC: 8.3 10*3/uL (ref 4.0–10.5)

## 2017-05-10 LAB — CBG MONITORING, ED: GLUCOSE-CAPILLARY: 235 mg/dL — AB (ref 65–99)

## 2017-05-10 MED ORDER — DEXTROSE 5 % IV SOLN
1.0000 g | Freq: Once | INTRAVENOUS | Status: AC
  Administered 2017-05-10: 1 g via INTRAVENOUS
  Filled 2017-05-10: qty 10

## 2017-05-10 MED ORDER — CEPHALEXIN 500 MG PO CAPS: 500.0000 mg | ORAL_CAPSULE | Freq: Two times a day (BID) | ORAL | 0 refills | Status: AC

## 2017-05-10 MED ORDER — SODIUM CHLORIDE 0.9 % IV BOLUS (SEPSIS)
500.0000 mL | Freq: Once | INTRAVENOUS | Status: AC
  Administered 2017-05-10: 500 mL via INTRAVENOUS

## 2017-05-10 NOTE — ED Triage Notes (Signed)
Pt reports "being confused" for unknown amount of time. No family members are currently with her in triage. Pt states she stopped taking her insulin on Sunday. She stopped taking it because "her body is telling her that she doesn't need it anymore."

## 2017-05-10 NOTE — Discharge Instructions (Signed)
You have been seen in the Emergency Department (ED) today for pain when urinating.  Your workup today suggests that you have a urinary tract infection (UTI).  Please take your antibiotic as prescribed and over-the-counter pain medication (Tylenol) as needed, but no more than recommended on the label instructions.  Drink PLENTY of fluids.  Call your regular doctor to schedule the next available appointment to follow up on today?s ED visit, or return immediately to the ED if your pain worsens, you have decreased urine production, develop fever, persistent vomiting, or other symptoms that concern you.  Please discuss your possible insulin side effects with your Endocrinologist and/or PCP as soon as possible.

## 2017-05-10 NOTE — Telephone Encounter (Signed)
Called patient and spoke to her husband and he stated that Nancy Glover has really gotten worse and he is taking her to the emergency room right now. He stated that her blood sugars have been higher but she stated that the Humalog was making her feel terrible. He also stated that she has become extremely confused and not able to find the bathroom in the middle of the night and really having a difficult time.   I advised to please keep [place] posted on what happens.

## 2017-05-10 NOTE — ED Provider Notes (Signed)
Emergency Department Provider Note   I have reviewed the triage vital signs and the nursing notes.   HISTORY  Chief Complaint Altered Mental Status and Hyperglycemia   HPI Nancy Glover is a 83 y.o. female with PMH of DM, HLD, HTN presents to the emergency department for evaluation of disorientation with intermittent vision changes and "not feeling right" with taking her insulin.  The patient states that her primary care physician started her on insulin approximately 1 month prior.  She stopped taking it several days ago and have noticed that her blood sugars have been elevated over that time.  She states that she stopped taking her insulin because she was having bad side effects which included headache, blurry vision, and fatigue shortly after taking her insulin.  The symptoms would last for several hours and then resolved.  States that her vision and general health were good prior to starting the insulin and so stopped. She notes that her body "is just telling me I don't need it (insulin)."  When she called her primary care physician today she was referred to the emergency department for further evaluation.  She denies any unilateral weakness or numbness.  No chest pain or difficulty breathing.  She was previously on metformin and had bad side effects to that as well.  No fevers or chills.  No other medication changes. No falls or hits to the head.    Past Medical History:  Diagnosis Date  . Diabetes mellitus, type 2 ([place])   . Hyperlipidemia   . Hypertension   . Macular degeneration of left eye   . PMR (polymyalgia rheumatica) ([place]) 2003  . PONV (postoperative nausea and vomiting)   . Ulcer of right leg ([place])   . Venous insufficiency, peripheral     Patient Active Problem List   Diagnosis Date Noted  . Iron deficiency anemia 07/21/2016  . Sepsis ([place]) 11/24/2015  . Nephrolithiasis 11/24/2015  . Lactic acidosis 11/24/2015  . SOB (shortness of breath) 11/24/2015  . Nausea with  vomiting 11/24/2015  . HLD (hyperlipidemia) 11/24/2015  . GERD without esophagitis 11/24/2015  . Status post skin graft-left pretibial region 04/24/2013  . Peripheral vascular disease, unspecified ([place]) 10/30/2012  . Atherosclerosis of native arteries of the extremities with ulceration(440.23) 10/30/2012  . Vitamin B12 deficiency anemia 09/17/2012  . Hypertension 09/14/2012  . Diabetes mellitus ([place]) 09/14/2012  . Ulcer of right leg ([place]) 09/14/2012  . Anemia 09/14/2012    Past Surgical History:  Procedure Laterality Date  . CATARACT EXTRACTION W/ INTRAOCULAR LENS IMPLANT Right   . CERVICAL BIOPSY  W/ LOOP ELECTRODE EXCISION  01-17-2001  DR LOMAX  . CYSTO/ HOD/ BLADDER BX  10-23-2006  DR PETERSON  . CYSTOSCOPY WITH STENT PLACEMENT Left 11/24/2015   Procedure: CYSTOSCOPY WITH STENT PLACEMENT left ureter left retrograde pylegram;  Surgeon: Ardis Hughs, MD;  Location: [facility];  Service: Urology;  Laterality: Left;  . ESOPHAGOGASTRODUODENOSCOPY N/A 09/15/2012   Procedure: ESOPHAGOGASTRODUODENOSCOPY (EGD);  Surgeon: Winfield Cunas., MD;  Location: [facility];  Service: Endoscopy;  Laterality: N/A;  . EYE SURGERY     rt cataract  . FRACTURE SURGERY  1975   lt elbow-fx  . SKIN SPLIT GRAFT Left 04/19/2013   Procedure: SKIN GRAFT FULL THICKNESS TO LEFT LEG FROM ABDOMEN;  Surgeon: Pedro Earls, MD;  Location: [facility];  Service: General;  Laterality: Left;  . TONSILLECTOMY      Current Outpatient Rx  . Order #: [id number] Class:  Print  . Order #: [id number] Class: Historical Med  . Order #: [id number] Class: Historical Med  . Order #: [id number] Class: Historical Med  . Order #: [id number] Class: Historical Med  . Order #: [id number] Class: Historical Med  . Order #: [id number] Class: Historical Med  . Order #: [id number] Class: Historical Med  . Order #: [id number] Class: Normal  . Order #: [id number] Class: Historical Med  . Order #: [id number] Class: Historical Med  . Order #:  [id number] Class: Historical Med  . Order #: [id number] Class: Historical Med  . Order #: [id number] Class: Phone In  . Order #: [id number] Class: Historical Med  . Order #: [id number] Class: Print  . Order #: [id number] Class: Normal  . Order #: [id number] Class: Normal    Allergies Actos [pioglitazone] and Sulfa antibiotics  Family History  Problem Relation Age of Onset  . Colon cancer Father   . Heart disease Father   . Hypertension Father   . Heart attack Father   . Cancer Father        unknown type cancer   . Heart disease Mother   . Anemia Mother   . Diabetes Neg Hx     Social History Social History  Substance Use Topics  . Smoking status: Former Smoker    Packs/day: 1.00    Years: 40.00    Types: Cigarettes    Quit date: 09/13/1991  . Smokeless tobacco: Never Used  . Alcohol use No    Review of Systems  Constitutional: No fever/chills. Intermittent disorientation.  Eyes: Intermittent blurry visual changes. ENT: No sore throat. Cardiovascular: Denies chest pain. Respiratory: Denies shortness of breath. Gastrointestinal: No abdominal pain.  No nausea, no vomiting.  No diarrhea.  No constipation. Genitourinary: Negative for dysuria. Musculoskeletal: Negative for back pain. Skin: Negative for rash. Neurological: Negative for focal weakness or numbness. Intermittent HAs.   10-point ROS otherwise negative.  ____________________________________________   PHYSICAL EXAM:  VITAL SIGNS: ED Triage Vitals [05/10/17 1305]  Enc Vitals Group     BP (!) 173/59     Pulse Rate 76     Resp 16     Temp 98.1 F (36.7 C)     Temp Source Oral     SpO2 96 %     Weight 125 lb (56.7 kg)     Height 5\' 3"  (1.6 m)   Constitutional: Alert and oriented x 4. Well appearing and in no acute distress. Eyes: Conjunctivae are normal. PERRL. EOMI. Head: Atraumatic. Nose: No congestion/rhinnorhea. Mouth/Throat: Mucous membranes are moist.   Neck: No stridor.   Cardiovascular: Normal rate,  regular rhythm. Good peripheral circulation. Grossly normal heart sounds.   Respiratory: Normal respiratory effort.  No retractions. Lungs CTAB. Gastrointestinal: Soft and nontender. No distention.  Musculoskeletal: No lower extremity tenderness nor edema. No gross deformities of extremities. Neurologic:  Normal speech and language. No gross focal neurologic deficits are appreciated. Normal CN exam 2-12.  Skin:  Skin is warm, dry and intact. No rash noted. Psychiatric: Mood and affect are normal. Speech and behavior are normal.  ____________________________________________   LABS (all labs ordered are listed, but only abnormal results are displayed)  Labs Reviewed  COMPREHENSIVE METABOLIC PANEL - Abnormal; Notable for the following:       Result Value   Glucose, Bld 254 (*)    Total Protein 6.0 (*)    Albumin 3.3 (*)    ALT 13 (*)    Total Bilirubin 1.4 (*)    All other components within normal limits  URINALYSIS, ROUTINE W REFLEX MICROSCOPIC - Abnormal; Notable  for the following:    APPearance CLOUDY (*)    Protein, ur 30 (*)    Nitrite POSITIVE (*)    Leukocytes, UA LARGE (*)    Bacteria, UA MANY (*)    Squamous Epithelial / LPF 0-5 (*)    All other components within normal limits  CBG MONITORING, ED - Abnormal; Notable for the following:    Glucose-Capillary 235 (*)    All other components within normal limits  URINE CULTURE  CBC   ____________________________________________  EKG   EKG Interpretation  Date/Time:  Wednesday May 10 2017 17:32:18 EDT Ventricular Rate:  66 PR Interval:    QRS Duration: 97 QT Interval:  420 QTC Calculation: 440 R Axis:   -30 Text Interpretation:  Junctional rhythm Left axis deviation Low voltage, precordial leads Consider anterior infarct No STEMI.  Confirmed by Nanda Quinton [phone]) on 05/10/2017 5:43:44 PM        ____________________________________________  RADIOLOGY  None ____________________________________________   PROCEDURES  Procedure(s) performed:   Procedures  None ____________________________________________   INITIAL IMPRESSION / ASSESSMENT AND PLAN / ED COURSE  Pertinent labs & imaging results that were available during my care of the patient were reviewed by me and considered in my medical decision making (see chart for details).  Patient presents to the emergency department for evaluation of intermittent headache with blurry vision, fatigue in the setting of taking insulin.  She states this only happens when she takes her insulin and seems to resolve since she stopped it several days ago.  She is noted increased blood sugars and her husband says she has been slightly disoriented.  No infection symptoms.  No UTI symptoms.  No chest pain.  Patient has a nonfocal neurological exam and is awake, alert, oriented x 4. No DKA on labs.   Patient with UTI. No fever, tachycardia, or signs of developing sepsis. Patient is awake and alert. Will treat with Rocephin in the ED and discharge with Keflex. Had Lynsi Dooner discussion regarding f/u with PCP and Endocrinology prior to discontinuing or changing the insulin dose or stopping all together. Agrees to call in the AM.   At this time, I do not feel there is any life-threatening condition present. I have reviewed and discussed all results (EKG, imaging, lab, urine as appropriate), exam findings with patient. I have reviewed nursing notes and appropriate previous records.  I feel the patient is safe to be discharged home without further emergent workup. Discussed usual and customary return precautions. Patient and family (if present) verbalize understanding and are comfortable with this plan.  Patient will follow-up with their primary care provider. If they do not have a primary care provider, information for follow-up has been provided to them. All  questions have been answered.  ____________________________________________  FINAL CLINICAL IMPRESSION(S) / ED DIAGNOSES  Final diagnoses:  Acute cystitis without hematuria  Hyperglycemia  Blurry vision     MEDICATIONS GIVEN DURING THIS VISIT:  Medications  sodium chloride 0.9 % bolus 500 mL (0 mLs Intravenous Stopped 05/10/17 1745)  cefTRIAXone (ROCEPHIN) 1 g in dextrose 5 % 50 mL IVPB (1 g Intravenous New Bag/Given 05/10/17 1745)     NEW OUTPATIENT MEDICATIONS STARTED DURING THIS VISIT:  New Prescriptions   CEPHALEXIN (KEFLEX) 500 MG CAPSULE    Take 1 capsule (500 mg total) by mouth 2 (two) times daily.    Note:  This document was prepared using Dragon voice recognition software and may include unintentional dictation errors.  Nanda Quinton, MD Emergency Medicine  Margette Fast, MD 05/10/17 [phone]

## 2017-05-11 ENCOUNTER — Encounter: Payer: Medicare Other | Admitting: Hematology

## 2017-05-11 NOTE — Progress Notes (Signed)
No show  This encounter was created in error - please disregard.

## 2017-05-13 LAB — URINE CULTURE
Culture: >=100000 — AB
Special Requests: NORMAL

## 2017-05-14 ENCOUNTER — Telehealth: Payer: Self-pay

## 2017-05-14 NOTE — Telephone Encounter (Signed)
Post ED Visit - Positive Culture Follow-up  Culture report reviewed by antimicrobial stewardship pharmacist:  []  Elenor Quinones, Pharm.D. []  Heide Guile, Pharm.D., BCPS AQ-ID []  Parks Neptune, Pharm.D., BCPS []  Alycia Rossetti, Pharm.D., BCPS []  [place], Pharm.D., BCPS, AAHIVP []  Legrand Como, Pharm.D., BCPS, AAHIVP []  Salome Arnt, PharmD, BCPS []  Dimitri Ped, PharmD, BCPS []  Vincenza Hews, PharmD, BCPS [facility] Pharm D Positive urine culture Treated with Cephalexin, organism sensitive to the same and no further patient follow-up is required at this time.  Genia Del 05/14/2017, 9:52 AM

## 2017-05-19 ENCOUNTER — Other Ambulatory Visit: Payer: Self-pay | Admitting: Family Medicine

## 2017-05-19 DIAGNOSIS — R413 Other amnesia: Secondary | ICD-10-CM

## 2017-05-22 ENCOUNTER — Emergency Department (HOSPITAL_COMMUNITY)
Admission: EM | Admit: 2017-05-22 | Discharge: 2017-05-22 | Disposition: A | Discharge status: Home / Self Care | Payer: Medicare Other | Attending: Emergency Medicine | Admitting: Emergency Medicine

## 2017-05-22 ENCOUNTER — Other Ambulatory Visit: Payer: Self-pay

## 2017-05-22 ENCOUNTER — Emergency Department (HOSPITAL_COMMUNITY): Payer: Medicare Other

## 2017-05-22 DIAGNOSIS — Z794 Long term (current) use of insulin: Secondary | ICD-10-CM | POA: Insufficient documentation

## 2017-05-22 DIAGNOSIS — R410 Disorientation, unspecified: Secondary | ICD-10-CM | POA: Diagnosis present

## 2017-05-22 DIAGNOSIS — R0602 Shortness of breath: Secondary | ICD-10-CM | POA: Diagnosis not present

## 2017-05-22 DIAGNOSIS — I10 Essential (primary) hypertension: Secondary | ICD-10-CM | POA: Insufficient documentation

## 2017-05-22 DIAGNOSIS — H538 Other visual disturbances: Secondary | ICD-10-CM | POA: Insufficient documentation

## 2017-05-22 DIAGNOSIS — Z87891 Personal history of nicotine dependence: Secondary | ICD-10-CM | POA: Insufficient documentation

## 2017-05-22 DIAGNOSIS — Z7982 Long term (current) use of aspirin: Secondary | ICD-10-CM | POA: Insufficient documentation

## 2017-05-22 DIAGNOSIS — Z79899 Other long term (current) drug therapy: Secondary | ICD-10-CM | POA: Insufficient documentation

## 2017-05-22 DIAGNOSIS — E119 Type 2 diabetes mellitus without complications: Secondary | ICD-10-CM | POA: Diagnosis not present

## 2017-05-22 LAB — COMPREHENSIVE METABOLIC PANEL
ALBUMIN: 3.1 g/dL — AB (ref 3.5–5.0)
ALT: 13 U/L — ABNORMAL LOW (ref 14–54)
AST: 17 U/L (ref 15–41)
Alkaline Phosphatase: 72 U/L (ref 38–126)
Anion gap: 8 (ref 5–15)
BUN: 11 mg/dL (ref 6–20)
CALCIUM: 8.5 mg/dL — AB (ref 8.9–10.3)
CHLORIDE: 103 mmol/L (ref 101–111)
CO2: 24 mmol/L (ref 22–32)
CREATININE: 0.66 mg/dL (ref 0.44–1.00)
Glucose, Bld: 128 mg/dL — ABNORMAL HIGH (ref 65–99)
Potassium: 3.8 mmol/L (ref 3.5–5.1)
SODIUM: 135 mmol/L (ref 135–145)
TOTAL PROTEIN: 5.9 g/dL — AB (ref 6.5–8.1)
Total Bilirubin: 1.8 mg/dL — ABNORMAL HIGH (ref 0.3–1.2)

## 2017-05-22 LAB — URINALYSIS, ROUTINE W REFLEX MICROSCOPIC
BILIRUBIN URINE: NEGATIVE
Bacteria, UA: NONE SEEN
Glucose, UA: NEGATIVE mg/dL
HGB URINE DIPSTICK: NEGATIVE
Ketones, ur: NEGATIVE mg/dL
NITRITE: NEGATIVE
PH: 6 (ref 5.0–8.0)
Protein, ur: NEGATIVE mg/dL
SPECIFIC GRAVITY, URINE: 1.014 (ref 1.005–1.030)

## 2017-05-22 LAB — CBG MONITORING, ED: GLUCOSE-CAPILLARY: 148 mg/dL — AB (ref 65–99)

## 2017-05-22 LAB — CBC WITH DIFFERENTIAL/PLATELET
BASOS ABS: 0 10*3/uL (ref 0.0–0.1)
BASOS PCT: 0 %
EOS ABS: 0.1 10*3/uL (ref 0.0–0.7)
Eosinophils Relative: 1 %
HCT: 40.3 % (ref 36.0–46.0)
HEMOGLOBIN: 13.6 g/dL (ref 12.0–15.0)
Lymphocytes Relative: 32 %
Lymphs Abs: 3.3 10*3/uL (ref 0.7–4.0)
MCH: 29.4 pg (ref 26.0–34.0)
MCHC: 33.7 g/dL (ref 30.0–36.0)
MCV: 87.2 fL (ref 78.0–100.0)
MONO ABS: 0.9 10*3/uL (ref 0.1–1.0)
MONOS PCT: 9 %
NEUTROS ABS: 5.9 10*3/uL (ref 1.7–7.7)
NEUTROS PCT: 58 %
Platelets: 181 10*3/uL (ref 150–400)
RBC: 4.62 MIL/uL (ref 3.87–5.11)
RDW: 14.1 % (ref 11.5–15.5)
WBC: 10.2 10*3/uL (ref 4.0–10.5)

## 2017-05-22 LAB — I-STAT TROPONIN, ED: TROPONIN I, POC: 0 ng/mL (ref 0.00–0.08)

## 2017-05-22 NOTE — ED Triage Notes (Signed)
Pt was placed in insulin by PCP x 1 month ago and since then has "not felt right". Pt also states she is having difficulty breathing this am and did not want to stay home alone.

## 2017-05-22 NOTE — ED Notes (Signed)
Pt using an external female catheter (Purewick).

## 2017-05-22 NOTE — ED Provider Notes (Signed)
[facility] EMERGENCY DEPARTMENT Provider Note   CSN: [id number] Arrival date & time: 05/22/17  1058     History   Chief Complaint Chief Complaint  Patient presents with  . Shortness of Breath    HPI Nancy Glover is a 83 y.o. female.  HPI  Presents with concern for continuing episodes of confusion since she has switched her insulin.  Keeps waking up in the middle of the night confused, sometimes not remembering where bathroom was. Came this morning as last night she was up, not sleeping, holding phone upside down and confused per her husband.  She reports she feels off only when she takes her insulin, which was started a little over one month ago. She came to the ED last week concerned regarding these symptoms.  This morning was breathing fast, states she didn't know where she was.  Reports foggy vision just after taking insulin.  Reports taking insulin morning and night.   Denies any new numbness, weakness, speech difficulties No falls or trauma  She is unsure whether she takes tramadol, was written in September. Reports she was given it after having pain and tingling related to an iron transfusion in February.     Wednesday has MRI scheduled.  Husband does reports she has some memory issues, but patient reports this only started after she changed insulin. He reports he thinks the insulin is the same.  Past Medical History:  Diagnosis Date  . Diabetes mellitus, type 2 ([place])   . Hyperlipidemia   . Hypertension   . Macular degeneration of left eye   . PMR (polymyalgia rheumatica) ([place]) 2003  . PONV (postoperative nausea and vomiting)   . Ulcer of right leg ([place])   . Venous insufficiency, peripheral     Patient Active Problem List   Diagnosis Date Noted  . Iron deficiency anemia 07/21/2016  . Sepsis ([place]) 11/24/2015  . Nephrolithiasis 11/24/2015  . Lactic acidosis 11/24/2015  . SOB (shortness of breath) 11/24/2015  . Nausea with vomiting  11/24/2015  . HLD (hyperlipidemia) 11/24/2015  . GERD without esophagitis 11/24/2015  . Status post skin graft-left pretibial region 04/24/2013  . Peripheral vascular disease, unspecified ([place]) 10/30/2012  . Atherosclerosis of native arteries of the extremities with ulceration(440.23) 10/30/2012  . Vitamin B12 deficiency anemia 09/17/2012  . Hypertension 09/14/2012  . Diabetes mellitus ([place]) 09/14/2012  . Ulcer of right leg ([place]) 09/14/2012  . Anemia 09/14/2012    Past Surgical History:  Procedure Laterality Date  . CATARACT EXTRACTION W/ INTRAOCULAR LENS IMPLANT Right   . CERVICAL BIOPSY  W/ LOOP ELECTRODE EXCISION  01-17-2001  DR LOMAX  . CYSTO/ HOD/ BLADDER BX  10-23-2006  DR PETERSON  . EYE SURGERY     rt cataract  . FRACTURE SURGERY  1975   lt elbow-fx  . TONSILLECTOMY      OB History    No data available       Home Medications    Prior to Admission medications   Medication Sig Start Date End Date Taking? Authorizing Provider  acetaminophen (TYLENOL) 325 MG tablet Take 2 tablets (650 mg total) by mouth every 6 (six) hours as needed. 08/21/16  Yes Waynetta Pean, PA-C  amLODipine (NORVASC) 5 MG tablet Take 10 mg by mouth daily.    Yes [provider]  aspirin EC 81 MG tablet Take 81 mg by mouth daily.   Yes [provider]  atorvastatin (LIPITOR) 20 MG tablet Take 20 mg by mouth daily.  Yes [provider]  Calcium Carbonate-Vitamin D (CALCIUM + D PO) Take 600 mg by mouth daily.   Yes [provider]  carvedilol (COREG) 12.5 MG tablet Take 12.5 mg 2 (two) times daily with a meal by mouth.   Yes [provider]  Cholecalciferol (VITAMIN D3) 2000 units capsule Take 2,000 Units daily by mouth.   Yes [provider]  ferrous sulfate 325 (65 FE) MG tablet Take 325 mg by mouth daily with breakfast.   Yes [provider]  furosemide (LASIX) 20 MG tablet Take 20 mg by mouth daily as needed (leg swelling).    Yes  [provider]  glucose blood (ACCU-CHEK AVIVA PLUS) test strip Check upto 2x daily 01/05/17  Yes Elayne Snare, MD  Insulin Isophane & Regular Human (HUMULIN 70/30 KWIKPEN) (70-30) 100 UNIT/ML PEN Inject 10 Units into the skin 2 (two) times daily before a meal. Patient taking differently: Inject 8 Units 2 (two) times daily before a meal into the skin.  03/17/17  Yes Elayne Snare, MD  Insulin Pen Needle (B-D UF III MINI PEN NEEDLES) 31G X 5 MM MISC Use on insulin pen to do 2 shots per day 01/05/17  Yes Elayne Snare, MD  irbesartan (AVAPRO) 300 MG tablet Take 300 mg daily by mouth. 04/22/17  Yes [provider]  metFORMIN (GLUCOPHAGE) 1000 MG tablet Take 1,000 mg by mouth 2 (two) times daily with a meal.   Yes [provider]  omeprazole (PRILOSEC) 20 MG capsule Take 20 mg daily by mouth.   Yes [provider]  potassium chloride (K-DUR) 10 MEQ tablet Take 10 mEq by mouth daily.   Yes [provider]  predniSONE (DELTASONE) 1 MG tablet Take 6 mg every morning by mouth. Take with 5mg  tablet to make 6mg    Yes [provider]  traMADol (ULTRAM) 50 MG tablet Take 1 tablet (50 mg total) by mouth every 6 (six) hours as needed. 03/23/17  Yes Truitt Merle, MD  VITAMIN B1-B12 IM Inject 1,000 mcg/mL into the muscle every 30 (thirty) days.   Yes [provider]    Family History Family History  Problem Relation Age of Onset  . Colon cancer Father   . Heart disease Father   . Hypertension Father   . Heart attack Father   . Cancer Father        unknown type cancer   . Heart disease Mother   . Anemia Mother   . Diabetes Neg Hx     Social History Social History   Tobacco Use  . Smoking status: Former Smoker    Packs/day: 1.00    Years: 40.00    Pack years: 40.00    Types: Cigarettes    Last attempt to quit: 09/13/1991    Years since quitting: 25.7  . Smokeless tobacco: Never Used  Substance Use Topics  . Alcohol use: No  . Drug use: No      Allergies   Actos [pioglitazone]; Linagliptin; and Sulfa antibiotics   Review of Systems Review of Systems  Constitutional: Negative for fever.  HENT: Negative for sore throat.   Eyes: Negative for visual disturbance.  Respiratory: Negative for cough and shortness of breath.   Cardiovascular: Negative for chest pain.  Gastrointestinal: Positive for diarrhea (chronic). Negative for abdominal pain, nausea and vomiting.  Genitourinary: Negative for difficulty urinating and dysuria.  Musculoskeletal: Negative for back pain and neck pain.  Skin: Negative for rash.  Neurological: Negative for syncope, facial  asymmetry, weakness, numbness and headaches.  Psychiatric/Behavioral: Positive for confusion.     Physical Exam Updated Vital Signs BP (!) 174/58   Pulse 79   Temp 98.5 F (36.9 C) (Oral)   Resp (!) 22   Ht 5\' 3"  (1.6 m)   Wt 65.8 kg (145 lb)   SpO2 94%   BMI 25.69 kg/m   Physical Exam  Constitutional: She is oriented to person, place, and time. She appears well-developed and well-nourished. No distress.  HENT:  Head: Normocephalic and atraumatic.  Eyes: Conjunctivae and EOM are normal.  Neck: Normal range of motion.  Cardiovascular: Normal rate, regular rhythm, normal heart sounds and intact distal pulses. Exam reveals no gallop and no friction rub.  No murmur heard. Pulmonary/Chest: Effort normal and breath sounds normal. No respiratory distress. She has no wheezes. She has no rales.  Abdominal: Soft. She exhibits no distension. There is no tenderness. There is no guarding.  Musculoskeletal: She exhibits no edema or tenderness.  Neurological: She is alert and oriented to person, place, and time. She has normal strength. No cranial nerve deficit or sensory deficit. Coordination normal. GCS eye subscore is 4. GCS verbal subscore is 5. GCS motor subscore is 6.  Macular degeneration left eye  Skin: Skin is warm and dry. No rash noted. She is not diaphoretic. No  erythema.  Nursing note and vitals reviewed.    ED Treatments / Results  Labs (all labs ordered are listed, but only abnormal results are displayed) Labs Reviewed  COMPREHENSIVE METABOLIC PANEL - Abnormal; Notable for the following components:      Result Value   Glucose, Bld 128 (*)    Calcium 8.5 (*)    Total Protein 5.9 (*)    Albumin 3.1 (*)    ALT 13 (*)    Total Bilirubin 1.8 (*)    All other components within normal limits  URINALYSIS, ROUTINE W REFLEX MICROSCOPIC - Abnormal; Notable for the following components:   Leukocytes, UA TRACE (*)    Squamous Epithelial / LPF 0-5 (*)    All other components within normal limits  CBG MONITORING, ED - Abnormal; Notable for the following components:   Glucose-Capillary 148 (*)    All other components within normal limits  URINE CULTURE  CBC WITH DIFFERENTIAL/PLATELET  I-STAT TROPONIN, ED    EKG  EKG Interpretation  Date/Time:  Monday May 22 2017 11:11:05 EST Ventricular Rate:  73 PR Interval:    QRS Duration: 98 QT Interval:  396 QTC Calculation: 437 R Axis:   -42 Text Interpretation:  Sinus rhythm Left axis deviation Consider anterior infarct No significant change since last tracing Confirmed by Gareth Morgan [phone]) on 05/22/2017 11:27:04 AM       Radiology Dg Chest 2 View  Result Date: 05/22/2017 CLINICAL DATA:  The patient reports not feeling well since beginning insulin last month. The patient reports difficulty breathing this morning associated with shortness of breath. History of hypertension, diabetes, former smoker. EXAM: CHEST  2 VIEW COMPARISON:  Chest x-ray of Nov 24, 2015 FINDINGS: The lungs are adequately inflated. There is no focal infiltrate. There is no pleural effusion. The cardiac silhouette is enlarged. The pulmonary vascularity is normal. There is calcification in the wall of the thoracic aorta. There is multilevel degenerative disc disease of the upper and midthoracic spine. There is old  deformity of the posterior aspect of the right eighth rib. IMPRESSION: There is no acute pneumonia. There are mild chronic bronchitic changes which are stable.  There is stable cardiomegaly without pulmonary edema. Thoracic aortic atherosclerosis. Electronically Signed   By: David  Martinique M.D.   On: 05/22/2017 12:47    Procedures Procedures (including critical care time)  Medications Ordered in ED Medications - No data to display   Initial Impression / Assessment and Plan / ED Course  I have reviewed the triage vital signs and the nursing notes.  Pertinent labs & imaging results that were available during my care of the patient were reviewed by me and considered in my medical decision making (see chart for details).     83 year old female with a history of diabetes, hypertension, hyperlipidemia, recent emergency department evaluation for concern of not feeling right after she was taking her insulin, with diagnosis of urinary tract infection, presents with continuing concerns of confusion after she takes her insulin.  She denies any new neurologic symptoms, and has no signs of CVA on exam, and have low suspicion for acute CVA.  She denies any history of trauma or headache prior to arrival, no blue suspicion for intracranial bleed.  CBC shows no significant findings, electrolytes are within normal limits, chest x-ray shows no signs of pneumonia, pulmonary edema or other.  Urinalysis no longer shows signs of infection.  Patient had appeared to While she was confused this morning, but at this time denies shortness of breath and doubt PE.  Denies CP, EKG and troponin WNL and doubt ACS.  Patient reports that the symptoms are associated with her insulin use.  Discussed at risk of hyperglycemia and complications are high if she discontinues insulin that I recommended continued conversation with her primary care doctor regarding her concerns.  Husband also reports she had some memory issues, and it is  possible that her confusion particularly at night may be sundowning.  She does not have signs of acute infection, ACS, or CVA.  They deny any other new medication changes.  Recommend continued outpatient evaluation, including MRI of scheduled this Wednesday.  Patient discharged in stable condition with understanding of reasons to return.   Final Clinical Impressions(s) / ED Diagnoses   Final diagnoses:  Confusion    ED Discharge Orders    None       Gareth Morgan, MD 05/22/17 2118

## 2017-05-22 NOTE — ED Notes (Signed)
Pt returned from X Ray.

## 2017-05-22 NOTE — ED Notes (Signed)
Pt stable, states understanding of discharge instructions 

## 2017-05-23 ENCOUNTER — Telehealth: Payer: Self-pay | Admitting: Endocrinology

## 2017-05-23 LAB — URINE CULTURE

## 2017-05-23 NOTE — Telephone Encounter (Signed)
Called patient and spoke to her husband and he stated that patient has been in the hospital and she is still having the same problems as before and they are going to try to make an earlier appointment to see if she can switch her insulin to try something different. Patient states after she takes the insulin that her eyesight becomes blurry and her head feels like something is crawling in it and she is not able to think and just does not feel right.

## 2017-05-23 NOTE — Telephone Encounter (Signed)
Caller Name:Amna [place]: [facility] [place], [place]  Reason for call: patient states she has gotten the wrong insulin, she states she does not know what kind she has, states it is "driving her crazy".  Please advise

## 2017-05-24 ENCOUNTER — Encounter: Payer: Self-pay | Admitting: Endocrinology

## 2017-05-24 ENCOUNTER — Ambulatory Visit: Payer: Medicare Other | Admitting: Endocrinology

## 2017-05-24 VITALS — BP 138/84 | HR 86 | Ht 63.0 in | Wt 147.8 lb

## 2017-05-24 DIAGNOSIS — R634 Abnormal weight loss: Secondary | ICD-10-CM | POA: Diagnosis not present

## 2017-05-24 DIAGNOSIS — R410 Disorientation, unspecified: Secondary | ICD-10-CM | POA: Diagnosis not present

## 2017-05-24 DIAGNOSIS — Z794 Long term (current) use of insulin: Secondary | ICD-10-CM | POA: Diagnosis not present

## 2017-05-24 DIAGNOSIS — E1165 Type 2 diabetes mellitus with hyperglycemia: Secondary | ICD-10-CM | POA: Diagnosis not present

## 2017-05-24 LAB — MICROALBUMIN / CREATININE URINE RATIO
Creatinine,U: 264.1 mg/dL
MICROALB UR: 23 mg/dL — AB (ref 0.0–1.9)
Microalb Creat Ratio: 8.7 mg/g (ref 0.0–30.0)

## 2017-05-24 LAB — URINALYSIS, ROUTINE W REFLEX MICROSCOPIC
HGB URINE DIPSTICK: NEGATIVE
NITRITE: NEGATIVE
RBC / HPF: NONE SEEN (ref 0–?)
Specific Gravity, Urine: >=1.03 — AB (ref 1.000–1.030)
Total Protein, Urine: 30 — AB
Urine Glucose: NEGATIVE
Urobilinogen, UA: 1 (ref 0.0–1.0)
pH: 5.5 (ref 5.0–8.0)

## 2017-05-24 LAB — BASIC METABOLIC PANEL
BUN: 17 mg/dL (ref 6–23)
CHLORIDE: 100 meq/L (ref 96–112)
CO2: 26 mEq/L (ref 19–32)
CREATININE: 0.84 mg/dL (ref 0.40–1.20)
Calcium: 9.3 mg/dL (ref 8.4–10.5)
GFR: 69.07 mL/min (ref >60.00)
Glucose, Bld: 268 mg/dL — ABNORMAL HIGH (ref 70–99)
POTASSIUM: 3.6 meq/L (ref 3.5–5.1)
Sodium: 135 mEq/L (ref 135–145)

## 2017-05-24 LAB — TSH: TSH: 0.55 u[IU]/mL (ref 0.35–4.50)

## 2017-05-24 LAB — GLUCOSE, RANDOM: GLUCOSE: 268 mg/dL — AB (ref 70–99)

## 2017-05-24 LAB — T4, FREE: FREE T4: 1.11 ng/dL (ref 0.60–1.60)

## 2017-05-24 NOTE — Patient Instructions (Signed)
Reduce insulin to 6 units before each meal, 3x daily; skip if not eating a meal  Bolide egg at [facility]

## 2017-05-24 NOTE — Progress Notes (Signed)
Patient ID: Nancy Glover, female   DOB: 23-Oct-1933, 83 y.o.   MRN: [id number]           Reason for Appointment: Follow-up for Type 2 Diabetes  Referring physician:Candace Tamala Julian  History of Present Illness:          Diagnosis: Type 2 diabetes mellitus, date of diagnosis: 2004       Past history: patient is unclear about the date and circumstances of her diagnosis. She probably has been on metformin since that time for diagnosis but not clear when her dose was increased to 1 g twice a day No detailed records of her previous level of control are available  She refuses to consider any brand-name medications because of cost; previously had been tried on Januvia with success She was started on insulin on her visit in 6/18 because of marked hyperglycemia symptomatic with weight loss  Recent history:   Current oral hypoglycemic drugs:Metformin 1000 mg a.m., 1000 mg p.m.  INSULIN dose: Humulin 70/30, 8 units twice a day  A1c done in July was 7.3, previously by PCP was 11.3 in April  Current management, blood sugar patterns and problems identified:  She came in again early for management of her diabetes  Although she had been complaining that her insulin for her blood sugar was making her confused and she wanted to change her brand continues to take the same insulin  She has had a couple of trips to ER for confusion and is getting evaluated by PCP, no hypoglycemia has been occurring  Because of her high readings at suppertime she was told to take 8 units 3 times a day with every meal but she is still taking this only at lunch and suppertime  Also recently is checking mostly FASTING blood sugars  These are quite variable but has had a couple of good readings also in the morning  Again has very sporadically blood sugars later in the day which are again mostly over 200  Only on Sunday evening at 7 PM she had a reading of 103  Today she has not taken any insulin but did eat  breakfast  She appears to have lost weight even though she claims she has a good appetite, but says that she may not always have a full meal sometimes  Hypoglycemia:   none  Has seen the dietitian and was advised to make some changes She is  drinking  sweetened ice tea or other regular soft drinks.   She is not watching fat intake consistently; her husband is doing the cooking          Side effects from medications have been:?  Nausea from high dose metformin    Glucose monitoring:           Glucometer: Accucheck     Blood Glucose readings recently checked about once a day on an average  Mean values apply above for all meters except median for One Touch  PRE-MEAL Fasting Lunch Dinner Bedtime Overall  Glucose range:  1 28-78  112   10 3-242  280    Mean/median: 158   213   194+/-65      Self-care: The diet that the patient has been following is: Variable Drinks occasional sweetened tea, usually in the early afternoon and has peanuts as snacks  Meals: 2 meals per day. Breakfast is cereal/oatmeal or biscuit; dinner Variable, sometimes 6 pm  Dietician visit, most recent:   10/2014.               Exercise: unable to do much, has leg pains     Weight history:  Wt Readings from Last 3 Encounters:  05/24/17 147 lb 12.8 oz (67 kg)  05/22/17 145 lb (65.8 kg)  05/10/17 125 lb (56.7 kg)    Glycemic control:     Lab Results  Component Value Date   HGBA1C 9.7 01/27/2017   HGBA1C 11.3 10/26/2016   HGBA1C 8.2 (H) 11/24/2015   Lab Results  Component Value Date   MICROALBUR 2.9 (H) 09/04/2015   LDLCALC 64 03/04/2015   CREATININE 0.66 05/22/2017       Allergies as of 05/24/2017      Reactions   Actos [pioglitazone] Hives, Swelling   Body swelling   Linagliptin Other (See Comments)   Sulfa Antibiotics Swelling   Tongue and mouth swelling      Medication List        Accurate as of 05/24/17  1:27 PM. Always use your most recent med list.           acetaminophen 325 MG tablet Commonly known as:  TYLENOL Take 2 tablets (650 mg total) by mouth every 6 (six) hours as needed.   amLODipine 5 MG tablet Commonly known as:  NORVASC Take 10 mg by mouth daily.   aspirin EC 81 MG tablet Take 81 mg by mouth daily.   atorvastatin 20 MG tablet Commonly known as:  LIPITOR Take 20 mg by mouth daily.   CALCIUM + D PO Take 600 mg by mouth daily.   carvedilol 12.5 MG tablet Commonly known as:  COREG Take 12.5 mg 2 (two) times daily with a meal by mouth.   ferrous sulfate 325 (65 FE) MG tablet Take 325 mg by mouth daily with breakfast.   furosemide 20 MG tablet Commonly known as:  LASIX Take 20 mg by mouth daily as needed (leg swelling).   glucose blood test strip Commonly known as:  ACCU-CHEK AVIVA PLUS Check upto 2x daily   Insulin Isophane & Regular Human (70-30) 100 UNIT/ML PEN Commonly known as:  HUMULIN 70/30 KWIKPEN Inject 10 Units into the skin 2 (two) times daily before a meal.   Insulin Pen Needle 31G X 5 MM Misc Commonly known as:  B-D UF III MINI PEN NEEDLES Use on insulin pen to do 2 shots per day   irbesartan 300 MG tablet Commonly known as:  AVAPRO Take 300 mg daily by mouth.   metFORMIN 1000 MG tablet Commonly known as:  GLUCOPHAGE Take 1,000 mg by mouth 2 (two) times daily with a meal.   omeprazole 20 MG capsule Commonly known as:  PRILOSEC Take 20 mg daily by mouth.   potassium chloride 10 MEQ tablet Commonly known as:  K-DUR Take 10 mEq by mouth daily.   predniSONE 1 MG tablet Commonly known as:  DELTASONE Take 6 mg every morning by mouth. Take with 5mg  tablet to make 6mg    traMADol 50 MG tablet Commonly known as:  ULTRAM Take 1 tablet (50 mg total) by mouth every 6 (six) hours as needed.   VITAMIN B1-B12 IM Inject 1,000 mcg/mL into the muscle every 30 (thirty) days.   Vitamin D3 2000 units capsule Take 2,000 Units daily by mouth.       Allergies:  Allergies  Allergen Reactions  .  Actos [Pioglitazone] Hives and Swelling    Body swelling  . Linagliptin  Other (See Comments)  . Sulfa Antibiotics Swelling    Tongue and mouth swelling    Past Medical History:  Diagnosis Date  . Diabetes mellitus, type 2 ([place])   . Hyperlipidemia   . Hypertension   . Macular degeneration of left eye   . PMR (polymyalgia rheumatica) ([place]) 2003  . PONV (postoperative nausea and vomiting)   . Ulcer of right leg ([place])   . Venous insufficiency, peripheral     Past Surgical History:  Procedure Laterality Date  . CATARACT EXTRACTION W/ INTRAOCULAR LENS IMPLANT Right   . CERVICAL BIOPSY  W/ LOOP ELECTRODE EXCISION  01-17-2001  DR LOMAX  . CYSTO/ HOD/ BLADDER BX  10-23-2006  DR PETERSON  . EYE SURGERY     rt cataract  . FRACTURE SURGERY  1975   lt elbow-fx  . TONSILLECTOMY      Family History  Problem Relation Age of Onset  . Colon cancer Father   . Heart disease Father   . Hypertension Father   . Heart attack Father   . Cancer Father        unknown type cancer   . Heart disease Mother   . Anemia Mother   . Diabetes Neg Hx     Social History:  reports that she quit smoking about 25 years ago. Her smoking use included cigarettes. She has a 40.00 pack-year smoking history. she has never used smokeless tobacco. She reports that she does not drink alcohol or use drugs.    Review of Systems   She reports periodic confusion  She is going to get a CT scan   Thyroid levels have been checked before   Lab Results  Component Value Date   TSH 0.39 03/04/2015    Lipid history:  She has been on Lipitor 20 mg , treated by PCP Last LDL was 55 in April       Lab Results  Component Value Date   CHOL 149 06/04/2015   HDL 57.50 06/04/2015   LDLCALC 64 03/04/2015   LDLDIRECT 72.0 09/04/2015   TRIG 281.0 (H) 06/04/2015   CHOLHDL 3 06/04/2015            Hypertension: present for several years and treated with Avapro and amlodipine.  Appears well-controlled  This is being  followed by PCP   Musculoskeletal:   She has had PMR for the several years taking low dose prednisone,  on 6 mg.      Last diabetic foot exam done in 10/2016 which was normal   Physical Examination:  BP 138/84   Pulse 86   Ht 5\' 3"  (1.6 m)   Wt 147 lb 12.8 oz (67 kg)   SpO2 96%   BMI 26.18 kg/m    ASSESSMENT:  Diabetes type 2 with BMI 27 See history of present illness for detailed discussion of his current management, blood sugar patterns and problems identified  She is on insulin and metformin, and insulin was started when she had significant hyperglycemia Although she is supposed to take insulin with every meal she is taking it only twice a day in her blood sugars are not controlled Again the highest blood sugars are in the afternoons and evenings indicating more tendency to postprandial hyperglycemia She is afraid of her getting low sugars and does not like to take insulin as directed Discussed the nature of premixed insulin and need to cover postprandial hyperglycemia   Also discussed that she is not getting a list meals especially like  at breakfast when she is only eating cereal without protein  Also emphasized that previously since she had significant hyperglycemia and she cannot be controlled on oral medications again as she is still requesting this; also with her continuing prednisone insulin is more appropriate  WEIGHT loss: Etiology unclear what will need to do thyroid levels as these have not been checked for some time  Episodes of confusion: Likely to be dementia related but she is following up with PCP  PLAN:   She will reduce her dose to 6 units but emphasized the need to take it before every meal including lunch unless eating a light meal  Her husband will help her with this  Discussed timing of injection, rotation of injection sites and blood sugar targets  More consistent monitoring at various times and at least every other day after supper at  night  Discussed needing protein at every meal especially breakfast  Consider more diabetes education  Will defer A1c for the next Visit   Patient Instructions  Reduce insulin to 6 units before each meal, 3x daily; skip if not eating a meal  Bolide egg at [facility]    Counseling time on subjects discussed in assessment and plan sections is over 50% of today's 25 minute visit   Dwain Huhn 05/24/2017, 1:27 PM   Note: This office note was prepared with Dragon voice recognition system technology. Any transcriptional errors that result from this process are unintentional.

## 2017-05-25 LAB — FRUCTOSAMINE: FRUCTOSAMINE: 312 umol/L — AB (ref 0–285)

## 2017-05-26 ENCOUNTER — Telehealth: Payer: Self-pay | Admitting: Endocrinology

## 2017-05-26 NOTE — Telephone Encounter (Signed)
Patient called-she is feeling worse than when she came in earlier this week. She is unable to function. She feels worse than ever. She feels something is not right. New medication is not working. She needs to get back to normal. She needs to be able to get around and she does not know where she is. She is confused. She is unable to get herself dressed. Please call patient and advise.  I (LA) told patient she should call her Primary Care Dr, go to ER or Urgent Care (per Loanne Drilling). She refuses to call her Primary Dr , she said she is Not going to ER or Urgent Care because she has already gone to the ER twice.

## 2017-05-29 NOTE — Telephone Encounter (Signed)
Please advise 

## 2017-05-29 NOTE — Telephone Encounter (Signed)
Her problems are not related to taking insulin and she needs to see her PCP

## 2017-05-29 NOTE — Telephone Encounter (Signed)
Called patient and she stated that she is not able to take the Humulin insulin. I advised that she could try to Novolin 70/30 and she wants her husband to call me when he gets home.

## 2017-05-31 ENCOUNTER — Ambulatory Visit: Payer: Medicare Other | Admitting: Endocrinology

## 2017-05-31 ENCOUNTER — Ambulatory Visit
Admission: RE | Admit: 2017-05-31 | Discharge: 2017-05-31 | Disposition: A | Discharge status: Home / Self Care | Payer: Medicare Other | Source: Ambulatory Visit | Attending: Family Medicine | Admitting: Family Medicine

## 2017-05-31 DIAGNOSIS — R413 Other amnesia: Secondary | ICD-10-CM

## 2017-05-31 MED ORDER — GADOBENATE DIMEGLUMINE 529 MG/ML IV SOLN
12.0000 mL | Freq: Once | INTRAVENOUS | Status: AC | PRN
  Administered 2017-05-31: 12 mL via INTRAVENOUS

## 2017-06-01 ENCOUNTER — Telehealth: Payer: Self-pay | Admitting: Endocrinology

## 2017-06-01 ENCOUNTER — Other Ambulatory Visit: Payer: Self-pay | Admitting: Neurosurgery

## 2017-06-01 ENCOUNTER — Other Ambulatory Visit (HOSPITAL_COMMUNITY): Payer: Self-pay | Admitting: Neurosurgery

## 2017-06-01 DIAGNOSIS — D496 Neoplasm of unspecified behavior of brain: Secondary | ICD-10-CM

## 2017-06-01 NOTE — Telephone Encounter (Signed)
Please see message. °

## 2017-06-01 NOTE — Telephone Encounter (Signed)
Noted, have left messages with Dr. Tamala Julian for her to call me back

## 2017-06-01 NOTE — Telephone Encounter (Signed)
Dr [name] called stated patient do not have dementia she has a large brain tumor.

## 2017-06-02 ENCOUNTER — Encounter (HOSPITAL_COMMUNITY): Payer: Self-pay | Admitting: *Deleted

## 2017-06-02 ENCOUNTER — Other Ambulatory Visit: Payer: Self-pay

## 2017-06-02 NOTE — Progress Notes (Signed)
Verbalized pre-op instructions with husband patient stated that it was ok for me to speak with him  7 days prior to surgery STOP taking any Aspirin(unless otherwise instructed by your surgeon), Aleve, Naproxen, Ibuprofen, Motrin, Advil, Goody's, BC's, all herbal medications, fish oil, and all vitamins   Patient has already stopped aspirin per dr Saintclair Halsted   Instructed to take 5 units of 70/30 insulin the night before surgery but none the morning of surgery No metformin the morning of surgery   Husband denies cardiologist

## 2017-06-03 ENCOUNTER — Ambulatory Visit (HOSPITAL_COMMUNITY)
Admission: RE | Admit: 2017-06-03 | Discharge: 2017-06-03 | Disposition: A | Discharge status: Home / Self Care | Payer: Medicare Other | Source: Ambulatory Visit | Attending: Neurosurgery | Admitting: Neurosurgery

## 2017-06-03 DIAGNOSIS — D496 Neoplasm of unspecified behavior of brain: Secondary | ICD-10-CM

## 2017-06-03 DIAGNOSIS — C712 Malignant neoplasm of temporal lobe: Secondary | ICD-10-CM | POA: Diagnosis not present

## 2017-06-03 DIAGNOSIS — C719 Malignant neoplasm of brain, unspecified: Secondary | ICD-10-CM

## 2017-06-03 DIAGNOSIS — R627 Adult failure to thrive: Secondary | ICD-10-CM | POA: Diagnosis not present

## 2017-06-03 MED ORDER — GADOBENATE DIMEGLUMINE 529 MG/ML IV SOLN
15.0000 mL | Freq: Once | INTRAVENOUS | Status: AC | PRN
  Administered 2017-06-03: 14 mL via INTRAVENOUS

## 2017-06-05 ENCOUNTER — Encounter (HOSPITAL_COMMUNITY): Payer: Self-pay

## 2017-06-05 ENCOUNTER — Inpatient Hospital Stay (HOSPITAL_COMMUNITY): Payer: Medicare Other

## 2017-06-05 ENCOUNTER — Inpatient Hospital Stay (HOSPITAL_COMMUNITY)
Admission: EM | Admit: 2017-06-05 | Discharge: 2017-06-07 | DRG: 055 | Disposition: A | Discharge status: Hospice | Payer: Medicare Other | Attending: Family Medicine | Admitting: Family Medicine

## 2017-06-05 ENCOUNTER — Encounter (HOSPITAL_COMMUNITY): Payer: Self-pay | Admitting: Emergency Medicine

## 2017-06-05 DIAGNOSIS — Z960 Presence of urogenital implants: Secondary | ICD-10-CM | POA: Diagnosis present

## 2017-06-05 DIAGNOSIS — E876 Hypokalemia: Secondary | ICD-10-CM | POA: Diagnosis present

## 2017-06-05 DIAGNOSIS — Z66 Do not resuscitate: Secondary | ICD-10-CM | POA: Diagnosis present

## 2017-06-05 DIAGNOSIS — I872 Venous insufficiency (chronic) (peripheral): Secondary | ICD-10-CM | POA: Diagnosis present

## 2017-06-05 DIAGNOSIS — Z79899 Other long term (current) drug therapy: Secondary | ICD-10-CM

## 2017-06-05 DIAGNOSIS — Z515 Encounter for palliative care: Secondary | ICD-10-CM | POA: Diagnosis present

## 2017-06-05 DIAGNOSIS — M353 Polymyalgia rheumatica: Secondary | ICD-10-CM | POA: Diagnosis present

## 2017-06-05 DIAGNOSIS — D509 Iron deficiency anemia, unspecified: Secondary | ICD-10-CM | POA: Diagnosis present

## 2017-06-05 DIAGNOSIS — C712 Malignant neoplasm of temporal lobe: Principal | ICD-10-CM | POA: Diagnosis present

## 2017-06-05 DIAGNOSIS — K219 Gastro-esophageal reflux disease without esophagitis: Secondary | ICD-10-CM | POA: Diagnosis present

## 2017-06-05 DIAGNOSIS — N390 Urinary tract infection, site not specified: Secondary | ICD-10-CM | POA: Diagnosis present

## 2017-06-05 DIAGNOSIS — G939 Disorder of brain, unspecified: Secondary | ICD-10-CM | POA: Diagnosis not present

## 2017-06-05 DIAGNOSIS — E860 Dehydration: Secondary | ICD-10-CM | POA: Diagnosis present

## 2017-06-05 DIAGNOSIS — Z7952 Long term (current) use of systemic steroids: Secondary | ICD-10-CM | POA: Diagnosis not present

## 2017-06-05 DIAGNOSIS — Z809 Family history of malignant neoplasm, unspecified: Secondary | ICD-10-CM | POA: Diagnosis not present

## 2017-06-05 DIAGNOSIS — R531 Weakness: Secondary | ICD-10-CM | POA: Diagnosis not present

## 2017-06-05 DIAGNOSIS — Z87891 Personal history of nicotine dependence: Secondary | ICD-10-CM

## 2017-06-05 DIAGNOSIS — Z7982 Long term (current) use of aspirin: Secondary | ICD-10-CM | POA: Diagnosis not present

## 2017-06-05 DIAGNOSIS — C719 Malignant neoplasm of brain, unspecified: Secondary | ICD-10-CM

## 2017-06-05 DIAGNOSIS — R627 Adult failure to thrive: Secondary | ICD-10-CM | POA: Diagnosis present

## 2017-06-05 DIAGNOSIS — Z888 Allergy status to other drugs, medicaments and biological substances status: Secondary | ICD-10-CM | POA: Diagnosis not present

## 2017-06-05 DIAGNOSIS — Z882 Allergy status to sulfonamides status: Secondary | ICD-10-CM | POA: Diagnosis not present

## 2017-06-05 DIAGNOSIS — Z7189 Other specified counseling: Secondary | ICD-10-CM

## 2017-06-05 DIAGNOSIS — Z832 Family history of diseases of the blood and blood-forming organs and certain disorders involving the immune mechanism: Secondary | ICD-10-CM

## 2017-06-05 DIAGNOSIS — L905 Scar conditions and fibrosis of skin: Secondary | ICD-10-CM | POA: Diagnosis present

## 2017-06-05 DIAGNOSIS — Z794 Long term (current) use of insulin: Secondary | ICD-10-CM | POA: Diagnosis not present

## 2017-06-05 DIAGNOSIS — Z961 Presence of intraocular lens: Secondary | ICD-10-CM | POA: Diagnosis present

## 2017-06-05 DIAGNOSIS — Z87442 Personal history of urinary calculi: Secondary | ICD-10-CM

## 2017-06-05 DIAGNOSIS — E1151 Type 2 diabetes mellitus with diabetic peripheral angiopathy without gangrene: Secondary | ICD-10-CM | POA: Diagnosis present

## 2017-06-05 DIAGNOSIS — E118 Type 2 diabetes mellitus with unspecified complications: Secondary | ICD-10-CM

## 2017-06-05 DIAGNOSIS — E119 Type 2 diabetes mellitus without complications: Secondary | ICD-10-CM

## 2017-06-05 DIAGNOSIS — G9389 Other specified disorders of brain: Secondary | ICD-10-CM | POA: Diagnosis present

## 2017-06-05 DIAGNOSIS — H3530 Unspecified macular degeneration: Secondary | ICD-10-CM | POA: Diagnosis present

## 2017-06-05 DIAGNOSIS — Z8739 Personal history of other diseases of the musculoskeletal system and connective tissue: Secondary | ICD-10-CM | POA: Diagnosis not present

## 2017-06-05 DIAGNOSIS — Z9841 Cataract extraction status, right eye: Secondary | ICD-10-CM

## 2017-06-05 DIAGNOSIS — Z8249 Family history of ischemic heart disease and other diseases of the circulatory system: Secondary | ICD-10-CM

## 2017-06-05 DIAGNOSIS — D519 Vitamin B12 deficiency anemia, unspecified: Secondary | ICD-10-CM | POA: Diagnosis present

## 2017-06-05 DIAGNOSIS — Z800 Family history of malignant neoplasm of digestive organs: Secondary | ICD-10-CM | POA: Diagnosis not present

## 2017-06-05 DIAGNOSIS — G8194 Hemiplegia, unspecified affecting left nondominant side: Secondary | ICD-10-CM | POA: Diagnosis present

## 2017-06-05 DIAGNOSIS — Z9049 Acquired absence of other specified parts of digestive tract: Secondary | ICD-10-CM

## 2017-06-05 DIAGNOSIS — Z8619 Personal history of other infectious and parasitic diseases: Secondary | ICD-10-CM

## 2017-06-05 DIAGNOSIS — E785 Hyperlipidemia, unspecified: Secondary | ICD-10-CM | POA: Diagnosis present

## 2017-06-05 DIAGNOSIS — I10 Essential (primary) hypertension: Secondary | ICD-10-CM | POA: Diagnosis present

## 2017-06-05 LAB — CBC WITH DIFFERENTIAL/PLATELET
BASOS ABS: 0 10*3/uL (ref 0.0–0.1)
BASOS PCT: 0 %
EOS ABS: 0.1 10*3/uL (ref 0.0–0.7)
EOS PCT: 1 %
HCT: 41.3 % (ref 36.0–46.0)
Hemoglobin: 14 g/dL (ref 12.0–15.0)
Lymphocytes Relative: 29 %
Lymphs Abs: 2.6 10*3/uL (ref 0.7–4.0)
MCH: 29.9 pg (ref 26.0–34.0)
MCHC: 33.9 g/dL (ref 30.0–36.0)
MCV: 88.1 fL (ref 78.0–100.0)
MONO ABS: 0.9 10*3/uL (ref 0.1–1.0)
MONOS PCT: 10 %
NEUTROS ABS: 5.4 10*3/uL (ref 1.7–7.7)
Neutrophils Relative %: 60 %
PLATELETS: 178 10*3/uL (ref 150–400)
RBC: 4.69 MIL/uL (ref 3.87–5.11)
RDW: 13.9 % (ref 11.5–15.5)
WBC: 8.9 10*3/uL (ref 4.0–10.5)

## 2017-06-05 LAB — URINALYSIS, ROUTINE W REFLEX MICROSCOPIC
Bilirubin Urine: NEGATIVE
GLUCOSE, UA: NEGATIVE mg/dL
KETONES UR: 80 mg/dL — AB
Nitrite: POSITIVE — AB
PH: 5 (ref 5.0–8.0)
Protein, ur: NEGATIVE mg/dL
SQUAMOUS EPITHELIAL / LPF: NONE SEEN
Specific Gravity, Urine: 1.044 — ABNORMAL HIGH (ref 1.005–1.030)

## 2017-06-05 LAB — HEPATIC FUNCTION PANEL
ALT: 9 U/L — AB (ref 14–54)
AST: 15 U/L (ref 15–41)
Albumin: 2.9 g/dL — ABNORMAL LOW (ref 3.5–5.0)
Alkaline Phosphatase: 64 U/L (ref 38–126)
BILIRUBIN DIRECT: 0.3 mg/dL (ref 0.1–0.5)
BILIRUBIN INDIRECT: 1.5 mg/dL — AB (ref 0.3–0.9)
Total Bilirubin: 1.8 mg/dL — ABNORMAL HIGH (ref 0.3–1.2)
Total Protein: 5.8 g/dL — ABNORMAL LOW (ref 6.5–8.1)

## 2017-06-05 LAB — I-STAT CHEM 8, ED
BUN: 8 mg/dL (ref 6–20)
CALCIUM ION: 1.13 mmol/L — AB (ref 1.15–1.40)
CREATININE: 0.6 mg/dL (ref 0.44–1.00)
Chloride: 101 mmol/L (ref 101–111)
Glucose, Bld: 101 mg/dL — ABNORMAL HIGH (ref 65–99)
HEMATOCRIT: 40 % (ref 36.0–46.0)
HEMOGLOBIN: 13.6 g/dL (ref 12.0–15.0)
Potassium: 3.2 mmol/L — ABNORMAL LOW (ref 3.5–5.1)
SODIUM: 137 mmol/L (ref 135–145)
TCO2: 25 mmol/L (ref 22–32)

## 2017-06-05 LAB — GLUCOSE, CAPILLARY: Glucose-Capillary: 119 mg/dL — ABNORMAL HIGH (ref 65–99)

## 2017-06-05 LAB — SEDIMENTATION RATE: Sed Rate: 19 mm/hr (ref 0–22)

## 2017-06-05 LAB — MAGNESIUM: Magnesium: 1.7 mg/dL (ref 1.7–2.4)

## 2017-06-05 MED ORDER — CARVEDILOL 12.5 MG PO TABS
12.5000 mg | ORAL_TABLET | Freq: Two times a day (BID) | ORAL | Status: DC
  Administered 2017-06-06 (×2): 12.5 mg via ORAL
  Filled 2017-06-05 (×3): qty 1

## 2017-06-05 MED ORDER — LACTATED RINGERS IV BOLUS (SEPSIS)
1000.0000 mL | Freq: Once | INTRAVENOUS | Status: AC
  Administered 2017-06-05: 1000 mL via INTRAVENOUS

## 2017-06-05 MED ORDER — INSULIN GLARGINE 100 UNIT/ML ~~LOC~~ SOLN
5.0000 [IU] | Freq: Every day | SUBCUTANEOUS | Status: DC
  Administered 2017-06-05 – 2017-06-06 (×2): 5 [IU] via SUBCUTANEOUS
  Filled 2017-06-05 (×2): qty 0.05

## 2017-06-05 MED ORDER — PROPOFOL 10 MG/ML IV BOLUS
INTRAVENOUS | Status: AC
  Filled 2017-06-05: qty 20

## 2017-06-05 MED ORDER — DEXAMETHASONE SODIUM PHOSPHATE 10 MG/ML IJ SOLN
4.0000 mg | Freq: Four times a day (QID) | INTRAMUSCULAR | Status: DC
  Administered 2017-06-06 – 2017-06-07 (×7): 4 mg via INTRAVENOUS
  Filled 2017-06-05 (×7): qty 1

## 2017-06-05 MED ORDER — ACETAMINOPHEN 325 MG PO TABS
650.0000 mg | ORAL_TABLET | Freq: Four times a day (QID) | ORAL | Status: DC | PRN
  Filled 2017-06-05: qty 2

## 2017-06-05 MED ORDER — IOPAMIDOL (ISOVUE-300) INJECTION 61%
INTRAVENOUS | Status: AC
  Administered 2017-06-05: 100 mL via INTRAVENOUS
  Filled 2017-06-05: qty 100

## 2017-06-05 MED ORDER — POTASSIUM CHLORIDE 20 MEQ PO PACK
40.0000 meq | PACK | Freq: Once | ORAL | Status: AC
  Administered 2017-06-05: 40 meq via ORAL
  Filled 2017-06-05: qty 2

## 2017-06-05 MED ORDER — AMLODIPINE BESYLATE 10 MG PO TABS
10.0000 mg | ORAL_TABLET | Freq: Every day | ORAL | Status: DC
  Administered 2017-06-05 – 2017-06-07 (×3): 10 mg via ORAL
  Filled 2017-06-05 (×3): qty 1

## 2017-06-05 MED ORDER — FENTANYL CITRATE (PF) 250 MCG/5ML IJ SOLN
INTRAMUSCULAR | Status: AC
  Filled 2017-06-05: qty 5

## 2017-06-05 MED ORDER — ONDANSETRON HCL 4 MG/2ML IJ SOLN: 4.0000 mg | Freq: Four times a day (QID) | INTRAMUSCULAR | Status: DC | PRN

## 2017-06-05 MED ORDER — POTASSIUM CHLORIDE 10 MEQ/100ML IV SOLN
10.0000 meq | Freq: Once | INTRAVENOUS | Status: DC
  Administered 2017-06-05: 10 meq via INTRAVENOUS
  Filled 2017-06-05: qty 100

## 2017-06-05 MED ORDER — DEXAMETHASONE SODIUM PHOSPHATE 10 MG/ML IJ SOLN
10.0000 mg | Freq: Once | INTRAMUSCULAR | Status: AC
  Administered 2017-06-05: 10 mg via INTRAVENOUS
  Filled 2017-06-05: qty 1

## 2017-06-05 MED ORDER — DOCUSATE SODIUM 100 MG PO CAPS
100.0000 mg | ORAL_CAPSULE | Freq: Two times a day (BID) | ORAL | Status: DC
  Administered 2017-06-05 – 2017-06-07 (×4): 100 mg via ORAL
  Filled 2017-06-05 (×4): qty 1

## 2017-06-05 MED ORDER — POLYETHYLENE GLYCOL 3350 17 G PO PACK: 17.0000 g | PACK | Freq: Every day | ORAL | Status: DC | PRN

## 2017-06-05 MED ORDER — TRAMADOL HCL 50 MG PO TABS: 50.0000 mg | ORAL_TABLET | Freq: Four times a day (QID) | ORAL | Status: DC | PRN

## 2017-06-05 MED ORDER — ONDANSETRON HCL 4 MG PO TABS: 4.0000 mg | ORAL_TABLET | Freq: Four times a day (QID) | ORAL | Status: DC | PRN

## 2017-06-05 NOTE — ED Provider Notes (Signed)
[facility] EMERGENCY DEPARTMENT Provider Note   CSN: [id number] Arrival date & time: 06/05/17  1350     History   Chief Complaint Chief Complaint  Patient presents with  . Weakness    HPI BRELYN WOEHL is a 83 y.o. female.  HPI  Patient with history of diabetes, glioblastoma of the brain, hypertension hyperlipidemia comes in with chief complaint of generalized weakness. Patient here with her husband.  Patient had an MRI 2 days ago which showed primary GBM.  She was supposed to come into the hospital for biopsy and evaluation for further treatment options.  However husband reports that patient has progressively gotten weak, and he was unable to get her out of the bed and to the hospital today.  Family called neurosurgery, and hospice team and they are advised to come to the ER.  Dr. Saintclair Halsted, neurosurgery assessed the patient.  He recommends that patient get medical workup.  If there is no reversible cause for the failure to thrive, he doubts that any intervention will be available.  He has requested palliative medicine to see the patient as well.  Patient is oriented x2, somnolent.  She denies any headaches.  Patient's only complaint is frequent urination.  Past Medical History:  Diagnosis Date  . Brain tumor ([place])   . Diabetes mellitus, type 2 ([place])   . Hyperlipidemia   . Hypertension   . Macular degeneration of left eye   . PMR (polymyalgia rheumatica) ([place]) 2003  . PONV (postoperative nausea and vomiting)   . Ulcer of right leg ([place])   . Venous insufficiency, peripheral     Patient Active Problem List   Diagnosis Date Noted  . Brain mass   . Palliative care by specialist   . Goals of care, counseling/discussion   . Iron deficiency anemia 07/21/2016  . Sepsis ([place]) 11/24/2015  . Nephrolithiasis 11/24/2015  . Lactic acidosis 11/24/2015  . SOB (shortness of breath) 11/24/2015  . Nausea with vomiting 11/24/2015  . HLD (hyperlipidemia) 11/24/2015  .  GERD without esophagitis 11/24/2015  . Status post skin graft-left pretibial region 04/24/2013  . Peripheral vascular disease, unspecified ([place]) 10/30/2012  . Atherosclerosis of native arteries of the extremities with ulceration(440.23) 10/30/2012  . Vitamin B12 deficiency anemia 09/17/2012  . Hypertension 09/14/2012  . Diabetes mellitus ([place]) 09/14/2012  . Ulcer of right leg ([place]) 09/14/2012  . Anemia 09/14/2012    Past Surgical History:  Procedure Laterality Date  . CATARACT EXTRACTION W/ INTRAOCULAR LENS IMPLANT Right   . CERVICAL BIOPSY  W/ LOOP ELECTRODE EXCISION  01-17-2001  DR LOMAX  . CYSTO/ HOD/ BLADDER BX  10-23-2006  DR PETERSON  . CYSTOSCOPY WITH STENT PLACEMENT left ureter left retrograde pylegram Left 11/24/2015   Performed by Ardis Hughs, MD at [facility] ORS  . ESOPHAGOGASTRODUODENOSCOPY (EGD) N/A 09/15/2012   Performed by Winfield Cunas., MD at [facility]  . EYE SURGERY     rt cataract  . FRACTURE SURGERY  1975   lt elbow-fx  . SKIN GRAFT FULL THICKNESS TO LEFT LEG FROM ABDOMEN Left 04/19/2013   Performed by Kaylyn Lim, MD at [facility]  . TONSILLECTOMY      OB History    No data available       Home Medications    Prior to Admission medications   Medication Sig Start Date End Date Taking? Authorizing Provider  acetaminophen (TYLENOL) 325 MG tablet Take 2 tablets (650 mg total) by mouth  every 6 (six) hours as needed. 08/21/16  Yes Waynetta Pean, PA-C  amLODipine (NORVASC) 5 MG tablet Take 10 mg by mouth daily.    Yes [provider]  aspirin EC 81 MG tablet Take 81 mg by mouth daily.   Yes [provider]  atorvastatin (LIPITOR) 20 MG tablet Take 20 mg by mouth daily.   Yes [provider]  Calcium Carbonate-Vitamin D (CALCIUM + D PO) Take 600 mg by mouth daily.   Yes [provider]  carvedilol (COREG) 12.5 MG tablet Take 12.5 mg 2 (two) times daily with a meal by mouth.   Yes [provider]   ferrous sulfate 325 (65 FE) MG tablet Take 325 mg by mouth daily with breakfast.   Yes [provider]  furosemide (LASIX) 20 MG tablet Take 20 mg by mouth daily as needed (leg swelling).    Yes [provider]  Insulin Isophane & Regular Human (HUMULIN 70/30 KWIKPEN) (70-30) 100 UNIT/ML PEN Inject 10 Units into the skin 2 (two) times daily before a meal. Patient taking differently: Inject 8 Units 2 (two) times daily before a meal into the skin.  03/17/17  Yes Elayne Snare, MD  irbesartan (AVAPRO) 300 MG tablet Take 300 mg daily by mouth. 04/22/17  Yes [provider]  metFORMIN (GLUCOPHAGE) 1000 MG tablet Take 1,000 mg by mouth 2 (two) times daily with a meal.   Yes [provider]  omeprazole (PRILOSEC) 20 MG capsule Take 20 mg daily by mouth.   Yes [provider]  potassium chloride (K-DUR) 10 MEQ tablet Take 10 mEq by mouth daily.   Yes [provider]  predniSONE (DELTASONE) 1 MG tablet Take 6 mg every morning by mouth. Take with 5mg  tablet to make 6mg    Yes [provider]  traMADol (ULTRAM) 50 MG tablet Take 1 tablet (50 mg total) by mouth every 6 (six) hours as needed. 03/23/17  Yes Truitt Merle, MD  VITAMIN B1-B12 IM Inject 1,000 mcg/mL into the muscle every 30 (thirty) days.   Yes [provider]  glucose blood (ACCU-CHEK AVIVA PLUS) test strip Check upto 2x daily 01/05/17   Elayne Snare, MD  Insulin Pen Needle (B-D UF III MINI PEN NEEDLES) 31G X 5 MM MISC Use on insulin pen to do 2 shots per day 01/05/17   Elayne Snare, MD    Family History Family History  Problem Relation Age of Onset  . Colon cancer Father   . Heart disease Father   . Hypertension Father   . Heart attack Father   . Cancer Father        unknown type cancer   . Heart disease Mother   . Anemia Mother   . Diabetes Neg Hx     Social History Social History   Tobacco Use  . Smoking status: Former Smoker    Packs/day: 1.00    Years: 40.00    Pack  years: 40.00    Types: Cigarettes    Last attempt to quit: 09/13/1991    Years since quitting: 25.7  . Smokeless tobacco: Never Used  Substance Use Topics  . Alcohol use: No  . Drug use: No     Allergies   Actos [pioglitazone]; Linagliptin; and Sulfa antibiotics   Review of Systems Review of Systems  Constitutional: Positive for activity change.  Psychiatric/Behavioral: Positive for confusion.  All other systems reviewed and are negative.    Physical Exam Updated Vital Signs There were no vitals  taken for this visit.  Physical Exam  Constitutional: She is oriented to person, place, and time. She appears well-developed.  HENT:  Head: Normocephalic and atraumatic.  Eyes: EOM are normal. Pupils are equal, round, and reactive to light.  Neck: Normal range of motion. Neck supple.  Cardiovascular: Normal rate.  Pulmonary/Chest: Effort normal.  Abdominal: Bowel sounds are normal.  Neurological: She is alert and oriented to person, place, and time.  Skin: Skin is warm and dry.  Nursing note and vitals reviewed.    ED Treatments / Results  Labs (all labs ordered are listed, but only abnormal results are displayed) Labs Reviewed  I-STAT CHEM 8, ED - Abnormal; Notable for the following components:      Result Value   Potassium 3.2 (*)    Glucose, Bld 101 (*)    Calcium, Ion 1.13 (*)    All other components within normal limits  CBC WITH DIFFERENTIAL/PLATELET  HEPATIC FUNCTION PANEL  URINALYSIS, ROUTINE W REFLEX MICROSCOPIC    EKG  EKG Interpretation None       Radiology Mr Jeri Cos Wo Contrast  Result Date: 06/03/2017 CLINICAL DATA:  Known brain tumor.  Stereotactic planning. EXAM: MRI HEAD WITHOUT AND WITH CONTRAST TECHNIQUE: Multiplanar, multiecho pulse sequences of the brain and surrounding structures were obtained without and with intravenous contrast. CONTRAST:  39mL MULTIHANCE GADOBENATE DIMEGLUMINE 529 MG/ML IV SOLN COMPARISON:  Brain MRI 3 days ago  FINDINGS: Brain: Mass in the right hemisphere that has dense cellularity and necrosis. The bulkiest portion of the mass is centered around the atrium of the right lateral ventricle with infiltration across the splenium of the corpus callosum into the left periatrial white matter. There is patchy FLAIR hyperintensity in the white matter and cortex of the right cerebral hemisphere with mass effect diffusely, most consistent with gliomatosis. Right-sided sulci are diffusely effaced. Areas of enhancement and most high-grade tumor is present along the superficial and posterior right temporal cortex and around the atrium of the right lateral ventricle. No superimposed infarct or hemorrhage. No hydrocephalus, but the right lateral ventricle has increased in size compared to prior. This deviates the septum pellucidum compared to prior. Elsewhere, midline shift is more mildly progressed, as is sulcal effacement along the right cerebral convexity. This is most likely rapidly progressing tumor as there is no superimposed hemorrhage, infarct, or focal edema. No obstructive process is seen to explain the change in right lateral ventricular volume. FLAIR hyperintensity in the left cerebral white matter is likely a combination of gliomatosis and chronic small vessel ischemia. Remote lacunar infarct in the left posterior frontal white matter. Small remote left inferior cerebellar infarct. Vascular: Major flow voids and vascular enhancements are preserved. Skull and upper cervical spine: Negative for marrow lesion. Sinuses/Orbits: Right cataract resection.  No acute finding. These results will be called to the ordering clinician or representative by the Radiologist Assistant, and communication documented in the PACS or zVision Dashboard. IMPRESSION: 1. Treatment planning scan re- demonstrates primary brain tumor consistent with glioblastoma involving both hemispheres. There is tumor enhancement and necrosis about the atrium of the  right lateral ventricle and scattered in the right temporal lobe. Gliomatosis seen throughout the right cerebral hemisphere. 2. Even though only 3 days since prior, there is progressive generalized mass effect on the right with further sulcal narrowing and midline displacement. No herniation. 3. The right lateral ventricular volume has increased from 3 days prior, although no obstructive process is seen. No overt hydrocephalus. Electronically Signed  By: Monte Fantasia M.D.   On: 06/03/2017 19:08    Procedures Procedures (including critical care time)  Medications Ordered in ED Medications  lactated ringers bolus 1,000 mL (not administered)  iopamidol (ISOVUE-300) 61 % injection (not administered)     Initial Impression / Assessment and Plan / ED Course  I have reviewed the triage vital signs and the nursing notes.  Pertinent labs & imaging results that were available during my care of the patient were reviewed by me and considered in my medical decision making (see chart for details).     Patient comes in with chief complaint of worsening weakness.  Patient has history of glioblastoma of the brain. She has had progressively worsening weakness over the past 2 months, with some increased and waxing and waning confusion.  Lucid right now and oriented x3.  She responds to all of my questions appropriately.  Patient also denies any headaches.  Neurosurgery has recommended that patient get medical and metabolic workup for the worsening weakness.  They recommended CT scan of the head with and without contrast and to start patient on IV Decadron 10 mg.  Palliative care has been consulted, and they have started working on redirecting goals of care.  Neurosurgery does not foresee any intervention currently, but they will continue to see patient.  Dr. Alroy Dust, from neuro oncology will also see the patient tomorrow.  Results from the ER workup discussed with the patient face to face and all  questions answered to the best of my ability.   Final Clinical Impressions(s) / ED Diagnoses   Final diagnoses:  Failure to thrive in adult  Primary brain glioblastoma [facility])    ED Discharge Orders    None       Varney Biles, MD 06/05/17 1625

## 2017-06-05 NOTE — ED Provider Notes (Signed)
There were no vitals taken for this visit.  Assuming care from Dr. Kathrynn Humble.  In short, Nancy Glover is a 83 y.o. female with a chief complaint of Weakness .  Refer to the original H&P for additional details.  The current plan of care is to speak with hospitalist team regarding admission. UA and CT imaging pending. Neurosurgery and Palliative care has seen the patient.   05:00 PM Discussed patient's case with medicine team to request admission. Patient and family (if present) updated with plan. Care transferred to medicine service.  I reviewed all nursing notes, vitals, pertinent old records, EKGs, labs, imaging (as available).  Nancy Quinton, MD Emergency Medicine      Nancy Glover, Nancy Olds, MD 06/05/17 [phone]

## 2017-06-05 NOTE — Consult Note (Signed)
Consultation Note Date: 06/05/2017   Patient Name: Nancy Glover  DOB: 1933-12-13  MRN: [id number]  Age / Sex: 83 y.o., female  PCP: [name], MD Referring Physician: Varney Biles, MD  Reason for Consultation: Establishing goals of care  HPI/Patient Profile: 83 y.o. female  with past medical history of DMII, HTN, HLD, and recent finding of brain mass (likely GBM) on MRI presented to the ED on 06/05/17 with complaints of progressive weakness and general decline. She was scheduled to have a biopsy of mass, however, this was canceled due to patient's functional decline. Palliative medicine consulted for [place].   Clinical Assessment and Goals of Care:  I have reviewed medical records including EPIC notes, labs and imaging, received report from Dr. Tammy Sours, and Dr. Saintclair Halsted, assessed the patient and then met at the bedside along with the patient and spouse  to discuss diagnosis prognosis, GOC, EOL wishes, disposition and options.  I introduced Palliative Medicine as specialized medical care for people living with serious illness. It focuses on providing relief from the symptoms and stress of a serious illness. The goal is to improve quality of life for both the patient and the family.  We discussed a brief life review of the patient. She has been living independently with her spouse at home. They have one daughter- who resides in [place]. They have called her and she is coming to [place].  As far as functional and nutritional status- patient has had a rapid decline over the last few weeks. She has not been able to walk or determine direction on her own the last few days. Her husband has had to guide her by holding her hands since Saturday. She has had a significant decrease in her eating and drinking. Her husband notes she's been sleeping more. He also notes that she keeps her eyes closed most of the time, even  when she's talking. [name] says this is because she "just wants to nap". She drinks two cups of water and takes a few bites of a sandwich I provide for her during our consult.   We discussed their current illness and what it means in the larger context of their on-going co-morbidities.  Natural disease trajectory and expectations at EOL were discussed.  I attempted to elicit values and goals of care important to the patient.  [name] says she "just wants to get the biopsy over with". Mr. Hackworth is tearful and expresses concern that this may not be possible.  We discussed code status and the patient and spouse request DNR status.  Prior to my consult they had consulted with Dr. Saintclair Halsted and the plan is to admit and do workup for possible reversible process to explain decline other than brain mass. Patient and spouse are in agreement with this plan for now. They would like PMT to continue to follow along and provide support and assist with decision making.  Primary Decision Maker PATIENT and her husband    SUMMARY OF RECOMMENDATIONS -DNR -Admit and workup per Dr. Windy Carina  recommendations for possible medical cause other than brain mass for functional decline -PMT will continue to follow- will have more complete GOC discussion after workup complete    Code Status/Advance Care Planning:  DNR  Palliative Prophylaxis:   Frequent Pain Assessment   Prognosis:    Unable to determine  Discharge Planning: To Be Determined  Primary Diagnoses: Present on Admission: **None**   I have reviewed the medical record, interviewed the patient and family, and examined the patient. The following aspects are pertinent.  Past Medical History:  Diagnosis Date  . Brain tumor ([place])   . Diabetes mellitus, type 2 ([place])   . Hyperlipidemia   . Hypertension   . Macular degeneration of left eye   . PMR (polymyalgia rheumatica) ([place]) 2003  . PONV (postoperative nausea and vomiting)   . Ulcer of right  leg ([place])   . Venous insufficiency, peripheral    Social History   Socioeconomic History  . Marital status: Married    Spouse name: None  . Number of children: None  . Years of education: None  . Highest education level: None  Social Needs  . Financial resource strain: None  . Food insecurity - worry: None  . Food insecurity - inability: None  . Transportation needs - medical: None  . Transportation needs - non-medical: None  Occupational History  . None  Tobacco Use  . Smoking status: Former Smoker    Packs/day: 1.00    Years: 40.00    Pack years: 40.00    Types: Cigarettes    Last attempt to quit: 09/13/1991    Years since quitting: 25.7  . Smokeless tobacco: Never Used  Substance and Sexual Activity  . Alcohol use: No  . Drug use: No  . Sexual activity: No  Other Topics Concern  . None  Social History Narrative  . None   Family History  Problem Relation Age of Onset  . Colon cancer Father   . Heart disease Father   . Hypertension Father   . Heart attack Father   . Cancer Father        unknown type cancer   . Heart disease Mother   . Anemia Mother   . Diabetes Neg Hx    Scheduled Meds: Continuous Infusions: . lactated ringers     PRN Meds:. Medications Prior to Admission:  Prior to Admission medications   Medication Sig Start Date End Date Taking? Authorizing Provider  acetaminophen (TYLENOL) 325 MG tablet Take 2 tablets (650 mg total) by mouth every 6 (six) hours as needed. 08/21/16   Waynetta Pean, PA-C  amLODipine (NORVASC) 5 MG tablet Take 10 mg by mouth daily.     [provider]  aspirin EC 81 MG tablet Take 81 mg by mouth daily.    [provider]  atorvastatin (LIPITOR) 20 MG tablet Take 20 mg by mouth daily.    [provider]  Calcium Carbonate-Vitamin D (CALCIUM + D PO) Take 600 mg by mouth daily.    [provider]  carvedilol (COREG) 12.5 MG tablet Take 12.5 mg 2 (two) times daily with a meal by mouth.     [provider]  Cholecalciferol (VITAMIN D3) 2000 units capsule Take 2,000 Units daily by mouth.    [provider]  ferrous sulfate 325 (65 FE) MG tablet Take 325 mg by mouth daily with breakfast.    [provider]  furosemide (LASIX) 20 MG tablet Take 20 mg by mouth daily as needed (  leg swelling).     [provider]  glucose blood (ACCU-CHEK AVIVA PLUS) test strip Check upto 2x daily 01/05/17   Elayne Snare, MD  Insulin Isophane & Regular Human (HUMULIN 70/30 KWIKPEN) (70-30) 100 UNIT/ML PEN Inject 10 Units into the skin 2 (two) times daily before a meal. Patient taking differently: Inject 8 Units 2 (two) times daily before a meal into the skin.  03/17/17   Elayne Snare, MD  Insulin Pen Needle (B-D UF III MINI PEN NEEDLES) 31G X 5 MM MISC Use on insulin pen to do 2 shots per day 01/05/17   Elayne Snare, MD  irbesartan (AVAPRO) 300 MG tablet Take 300 mg daily by mouth. 04/22/17   [provider]  metFORMIN (GLUCOPHAGE) 1000 MG tablet Take 1,000 mg by mouth 2 (two) times daily with a meal.    [provider]  omeprazole (PRILOSEC) 20 MG capsule Take 20 mg daily by mouth.    [provider]  potassium chloride (K-DUR) 10 MEQ tablet Take 10 mEq by mouth daily.    [provider]  predniSONE (DELTASONE) 1 MG tablet Take 6 mg every morning by mouth. Take with 79m tablet to make 685m   [provider]  traMADol (ULTRAM) 50 MG tablet Take 1 tablet (50 mg total) by mouth every 6 (six) hours as needed. 03/23/17   FeTruitt MerleMD  VITAMIN B1-B12 IM Inject 1,000 mcg/mL into the muscle every 30 (thirty) days.    [provider]   Allergies  Allergen Reactions  . Actos [Pioglitazone] Hives and Swelling    Body swelling  . Linagliptin Other (See Comments)  . Sulfa Antibiotics Swelling    Tongue and mouth swelling   Review of Systems  Constitutional: Positive for activity change, appetite change and fatigue.  Eyes: Positive  for visual disturbance.  Neurological: Positive for headaches.    Physical Exam  Constitutional: She is oriented to person, place, and time.  Cardiovascular: Normal rate and regular rhythm.  Pulmonary/Chest: Effort normal and breath sounds normal.  Musculoskeletal:  Left sided weakness  Neurological: She is oriented to person, place, and time.  Keeps eyes closed, respond appropriately to questions  Skin: There is pallor.  Psychiatric:  Flat affect  Nursing note and vitals reviewed.   Vital Signs: There were no vitals taken for this visit.         SpO2:   O2 Device:  O2 Flow Rate: .   IO: Intake/output summary: No intake or output data in the 24 hours ending 06/05/17 1602  LBM:   Baseline Weight:   Most recent weight:       Palliative Assessment/Data: PPS: 50%     Thank you for this consult. Palliative medicine will continue to follow and assist as needed.   Time In: 1400 Time Out: 1530 Time Total: 90 minutes Greater than 50%  of this time was spent counseling and coordinating care related to the above assessment and plan.  Signed by: KaMariana KaufmanAGNP-C Palliative Medicine    Please contact Palliative Medicine Team phone at [phone] questions and concerns.  For individual provider: See AmShea Evans

## 2017-06-05 NOTE — Consult Note (Signed)
Reason for Consult: Generalized weakness neurologic decline brain mass consistent with glioblastoma Referring Physician: Emergency department  AKACIA BOLTZ is an 83 y.o. female.  HPI: 83 year old female who had some mental status changes underwent CT and MRI scan which showed what appears to be a multifocal glioblastoma. Patient seen in the office last week and we elected proceed forward stereotactic biopsy. However over the last several days patient had significant decline in functional status she is nonambulatory poor appetite hasn't been eating and drinking difficulty with bowel bladder control. Patient had been scheduled for surgical biopsy of CSF noted however we canceled the procedure patient will need to be admitted to medicine and worked up for metabolic reason for functional decline or his of the natural history and the progression of disease.  Past Medical History:  Diagnosis Date  . Brain tumor ([place])   . Diabetes mellitus, type 2 ([place])   . Hyperlipidemia   . Hypertension   . Macular degeneration of left eye   . PMR (polymyalgia rheumatica) ([place]) 2003  . PONV (postoperative nausea and vomiting)   . Ulcer of right leg ([place])   . Venous insufficiency, peripheral     Past Surgical History:  Procedure Laterality Date  . CATARACT EXTRACTION W/ INTRAOCULAR LENS IMPLANT Right   . CERVICAL BIOPSY  W/ LOOP ELECTRODE EXCISION  01-17-2001  DR LOMAX  . CYSTO/ HOD/ BLADDER BX  10-23-2006  DR PETERSON  . CYSTOSCOPY WITH STENT PLACEMENT left ureter left retrograde pylegram Left 11/24/2015   Performed by Ardis Hughs, MD at [facility] ORS  . ESOPHAGOGASTRODUODENOSCOPY (EGD) N/A 09/15/2012   Performed by Winfield Cunas., MD at [facility]  . EYE SURGERY     rt cataract  . FRACTURE SURGERY  1975   lt elbow-fx  . SKIN GRAFT FULL THICKNESS TO LEFT LEG FROM ABDOMEN Left 04/19/2013   Performed by Kaylyn Lim, MD at [facility]  . TONSILLECTOMY      Family History  Problem  Relation Age of Onset  . Colon cancer Father   . Heart disease Father   . Hypertension Father   . Heart attack Father   . Cancer Father        unknown type cancer   . Heart disease Mother   . Anemia Mother   . Diabetes Neg Hx     Social History:  reports that she quit smoking about 25 years ago. Her smoking use included cigarettes. She has a 40.00 pack-year smoking history. she has never used smokeless tobacco. She reports that she does not drink alcohol or use drugs.  Allergies:  Allergies  Allergen Reactions  . Actos [Pioglitazone] Hives and Swelling    Body swelling  . Linagliptin Other (See Comments)  . Sulfa Antibiotics Swelling    Tongue and mouth swelling    Medications: I have reviewed the patient's current medications.  No results found for this or any previous visit (from the past 48 hour(s)).  Mr Jeri Cos Wo Contrast  Result Date: 06/03/2017 CLINICAL DATA:  Known brain tumor.  Stereotactic planning. EXAM: MRI HEAD WITHOUT AND WITH CONTRAST TECHNIQUE: Multiplanar, multiecho pulse sequences of the brain and surrounding structures were obtained without and with intravenous contrast. CONTRAST:  80mL MULTIHANCE GADOBENATE DIMEGLUMINE 529 MG/ML IV SOLN COMPARISON:  Brain MRI 3 days ago FINDINGS: Brain: Mass in the right hemisphere that has dense cellularity and necrosis. The bulkiest portion of the mass is centered around the atrium of the right lateral  ventricle with infiltration across the splenium of the corpus callosum into the left periatrial white matter. There is patchy FLAIR hyperintensity in the white matter and cortex of the right cerebral hemisphere with mass effect diffusely, most consistent with gliomatosis. Right-sided sulci are diffusely effaced. Areas of enhancement and most high-grade tumor is present along the superficial and posterior right temporal cortex and around the atrium of the right lateral ventricle. No superimposed infarct or hemorrhage. No  hydrocephalus, but the right lateral ventricle has increased in size compared to prior. This deviates the septum pellucidum compared to prior. Elsewhere, midline shift is more mildly progressed, as is sulcal effacement along the right cerebral convexity. This is most likely rapidly progressing tumor as there is no superimposed hemorrhage, infarct, or focal edema. No obstructive process is seen to explain the change in right lateral ventricular volume. FLAIR hyperintensity in the left cerebral white matter is likely a combination of gliomatosis and chronic small vessel ischemia. Remote lacunar infarct in the left posterior frontal white matter. Small remote left inferior cerebellar infarct. Vascular: Major flow voids and vascular enhancements are preserved. Skull and upper cervical spine: Negative for marrow lesion. Sinuses/Orbits: Right cataract resection.  No acute finding. These results will be called to the ordering clinician or representative by the Radiologist Assistant, and communication documented in the PACS or zVision Dashboard. IMPRESSION: 1. Treatment planning scan re- demonstrates primary brain tumor consistent with glioblastoma involving both hemispheres. There is tumor enhancement and necrosis about the atrium of the right lateral ventricle and scattered in the right temporal lobe. Gliomatosis seen throughout the right cerebral hemisphere. 2. Even though only 3 days since prior, there is progressive generalized mass effect on the right with further sulcal narrowing and midline displacement. No herniation. 3. The right lateral ventricular volume has increased from 3 days prior, although no obstructive process is seen. No overt hydrocephalus. Electronically Signed   By: Monte Fantasia M.D.   On: 06/03/2017 19:08    Review of Systems  Constitutional: Positive for weight loss.  Neurological: Positive for tingling, focal weakness, weakness and headaches.   There were no vitals taken for this  visit. Physical Exam  Constitutional: She is oriented to person, place, and time. She appears well-developed.  HENT:  Head: Normocephalic.  Eyes: Pupils are equal, round, and reactive to light.  Neck: Normal range of motion.  Respiratory: Effort normal.  GI: Soft.  Neurological: She is alert and oriented to person, place, and time.  Patient somnolent but arousable awake does follow commands but generalized weakness with a slight left hemiparesis    Assessment/Plan: 83 year old female with probable multi-focal glioblastoma with significant decline in functional status. Recommend internal medicine admission metabolic workup for decline in functional status. Clearly the patient looks like due to her rapid decline that we should not proceed forward biopsy as she would not be a candidate for any adjuvant treatment with her poor functional status. I have spoken to neuro-oncology and they will see the patient later today or in the morning I recommend patient be admitted placed on IV Decadron worked up for a systemic infection or metabolic abnormality. Palliative care is already on board and consulting.  Dmario Russom P 06/05/2017, 2:58 PM

## 2017-06-05 NOTE — ED Triage Notes (Signed)
Pt here from home today , pt was suppose to have surgery today but family was unable to get pt here today, surgery postponed till Wednesday

## 2017-06-05 NOTE — ED Notes (Signed)
Admitting physician at bedside at this time.

## 2017-06-05 NOTE — ED Notes (Signed)
Pt to go upstairs when transport person is available.

## 2017-06-05 NOTE — ED Notes (Signed)
Patient transported to CT 

## 2017-06-05 NOTE — ED Notes (Signed)
Patient placed on purewick, tolerated well.  

## 2017-06-05 NOTE — H&P (Signed)
History and Physical  Nancy Glover URK:[id number] DOB: 11/07/1933 DOA: 06/05/2017  Referring physician: Dr. Nanda Quinton PCP: [name], MD  Outpatient Specialists: Endocrinology: Dr. Dwyane Dee Patient coming from: Home At her baseline ambulates with assistance  Chief Complaint: Worsening weakness  HPI: Nancy Glover is a 83 y.o. female with medical history significant for type 2 diabetes on insulin therapy, polymyalgia rheumatica on chronic prednisone therapy, hypertension, and recently diagnosed glioblastoma (05/2017)who presents on 06/05/2017 with worsening generalized weakness for the 3 days.  Since the beginning of this month patient has been evaluated in the ED twice for worsening confusion and memory loss presumed to be related to poor control of diabetes.  Patient was evaluated by her outpatient endocrinologist who did not think her symptoms were related to her diabetes management.  Her PCP sent her for a MRI brain on 05/31/2017 to evaluate her acute presentation of memory loss and was found to have multifocal nonenhancing brain tumor concerning for glioblastoma.  Patient was evaluated by neurosurgery (Dr. Saintclair Halsted) and treatment planning brain MRI on 06/03/2017 redemonstrated primary brain tumor consistent with glioblastoma involving both hemispheres..  Patient's husband provides most of history as patient is unable to remember much of her symptoms.  Her husband states her worsening memory loss and confusion has been ongoing for approximately 2 months.  In the past 2 weeks, he noticed worsening loss of strength as the patient became more dependent on him for ambulation.  Additionally, he noticed poor appetite and patient has been nonadherent with blood pressure medications.  Today patient was  scheduled for brain biopsy; however, her husband reports her strength has been even more diminished in the past few days with increased difficulty in transporting her.  This prompted him to present to  the ED.  Family denies any fevers, chills, cough, sick contacts, dysuria, abdominal pain, diarrhea, constipation, or any changes in medications.  Of note patient does have polymyalgia rheumatica which she has been on chronic prednisone therapy.   ED Course:  Vital signs notable for patient afebrile, normal oxygen saturation, normal heart rate, blood pressure elevated at 183/60.  Lab work notable for potassium: 3.2, glucose 101,05/2017)Creatinine 0.6, calcium ionized 1.13 (normal range 1.15-1.40).  Chest x-ray was negative for any acute cardiopulmonary disease With and without, showed unchanged left midline shift. CT head and unchanged appearance of right hemispheric glioblastoma satellite lesions.  Neurosurgery was consulted in the ED who recommended IV Decadron and further workup to rule out other metabolic/infectious etiologies rapid neurologic decline in the setting of known glioblastoma.  Palliative care was also consulted in the ED for goals of care discussion.  Patient and family requested DNR status.  Family would like palliative care to continue to follow to support with decision-making.  IV Decadron x1 and 1 L LR bolus given to the patient in the ED.  Patient was admitted to hospitalist service for further workup.  Review of Systems:As mentioned in the history of present illness.Review of systems are otherwise negative Patient seen in the ED.     Past Medical History:  Diagnosis Date  . Brain tumor ([place])   . Diabetes mellitus, type 2 ([place])   . Hyperlipidemia   . Hypertension   . Macular degeneration of left eye   . PMR (polymyalgia rheumatica) ([place]) 2003  . PONV (postoperative nausea and vomiting)   . Ulcer of right leg ([place])   . Venous insufficiency, peripheral    Past Surgical History:  Procedure Laterality Date  . CATARACT EXTRACTION  W/ INTRAOCULAR LENS IMPLANT Right   . CERVICAL BIOPSY  W/ LOOP ELECTRODE EXCISION  01-17-2001  DR LOMAX  . CYSTO/ HOD/ BLADDER BX   10-23-2006  DR PETERSON  . CYSTOSCOPY WITH STENT PLACEMENT left ureter left retrograde pylegram Left 11/24/2015   Performed by Ardis Hughs, MD at [facility]S  . ESOPHAGOGASTRODUODENOSCOPY (EGD) N/A 09/15/2012   Performed by Winfield Cunas., MD at [facility]  . EYE SURGERY     rt cataract  . FRACTURE SURGERY  1975   lt elbow-fx  . SKIN GRAFT FULL THICKNESS TO LEFT LEG FROM ABDOMEN Left 04/19/2013   Performed by Kaylyn Lim, MD at [facility]  . TONSILLECTOMY      Social History:  reports that she quit smoking about 25 years ago. Her smoking use included cigarettes. She has a 40.00 pack-year smoking history. she has never used smokeless tobacco. She reports that she does not drink alcohol or use drugs.   Allergies  Allergen Reactions  . Actos [Pioglitazone] Hives and Swelling    Body swelling  . Linagliptin Other (See Comments)  . Sulfa Antibiotics Swelling    Tongue and mouth swelling    Family History  Problem Relation Age of Onset  . Colon cancer Father   . Heart disease Father   . Hypertension Father   . Heart attack Father   . Cancer Father        unknown type cancer   . Heart disease Mother   . Anemia Mother   . Diabetes Neg Hx       Prior to Admission medications   Medication Sig Start Date End Date Taking? Authorizing Provider  acetaminophen (TYLENOL) 325 MG tablet Take 2 tablets (650 mg total) by mouth every 6 (six) hours as needed. 08/21/16  Yes Waynetta Pean, PA-C  amLODipine (NORVASC) 5 MG tablet Take 10 mg by mouth daily.    Yes [provider]  aspirin EC 81 MG tablet Take 81 mg by mouth daily.   Yes [provider]  atorvastatin (LIPITOR) 20 MG tablet Take 20 mg by mouth daily.   Yes [provider]  Calcium Carbonate-Vitamin D (CALCIUM + D PO) Take 600 mg by mouth daily.   Yes [provider]  carvedilol (COREG) 12.5 MG tablet Take 12.5 mg 2 (two) times daily with a meal by mouth.   Yes [provider]  ferrous sulfate 325 (65 FE) MG tablet Take 325 mg by mouth daily with breakfast.   Yes [provider]  furosemide (LASIX) 20 MG tablet Take 20 mg by mouth daily as needed (leg swelling).    Yes [provider]  Insulin Isophane & Regular Human (HUMULIN 70/30 KWIKPEN) (70-30) 100 UNIT/ML PEN Inject 10 Units into the skin 2 (two) times daily before a meal. Patient taking differently: Inject 8 Units 2 (two) times daily before a meal into the skin.  03/17/17  Yes Elayne Snare, MD  irbesartan (AVAPRO) 300 MG tablet Take 300 mg daily by mouth. 04/22/17  Yes [provider]  metFORMIN (GLUCOPHAGE) 1000 MG tablet Take 1,000 mg by mouth 2 (two) times daily with a meal.   Yes [provider]  omeprazole (PRILOSEC) 20 MG capsule Take 20 mg daily by mouth.   Yes [provider]  potassium chloride (K-DUR) 10 MEQ tablet Take 10 mEq by mouth daily.   Yes [provider]  predniSONE (DELTASONE) 1 MG tablet Take 6 mg every morning  by mouth. Take with 23m tablet to make 672m  Yes [provider]  traMADol (ULTRAM) 50 MG tablet Take 1 tablet (50 mg total) by mouth every 6 (six) hours as needed. 03/23/17  Yes FeTruitt MerleMD  VITAMIN B1-B12 IM Inject 1,000 mcg/mL into the muscle every 30 (thirty) days.   Yes [provider]  glucose blood (ACCU-CHEK AVIVA PLUS) test strip Check upto 2x daily 01/05/17   KuElayne SnareMD  Insulin Pen Needle (B-D UF III MINI PEN NEEDLES) 31G X 5 MM MISC Use on insulin pen to do 2 shots per day 01/05/17   KuElayne SnareMD    Physical Exam: BP (!) 183/60 (BP Location: Right Arm)   Pulse 74   Temp 98 F (36.7 C) (Oral)   Resp 16   SpO2 95%   General: Ill-appearing woman, lying in bed in no apparent distress  ENT: Oral Mucosa clear ,moist,. Cardiovascular: regular rate and rhythm, no Murmurs, rubs or gallops, peripheral Pulses Present, no edema Respiratory: Normal respiratory effort, Bilateral Air entry  equal,Clear to Auscultation, no Crackles or wheezes Abdomen: soft, non-distended, non-tender, normal bowel sounds, no guarding or rebound tenderness Skin: No Rash. Scar on left lower shin Musculoskeletal:No clubbing / cyanosis. No joint deformity upper and lower extremities. Good ROM, no contractures. Normal muscle tone Neurologic: Alert to voice, oriented to person, place, season (thought the year was 1918, and the month January).  Able to follow commands.  Able to move upper and lower extremities against gravity decreased strength in both upper and lower extremities with resistance.  Negative Babinski sign Psychiatric: appropriate affect, and mood          Labs on Admission:  Basic Metabolic Panel: Recent Labs  Lab 06/05/17 1553  NA 137  K 3.2*  CL 101  GLUCOSE 101*  BUN 8  CREATININE 0.60   Liver Function Tests: Recent Labs  Lab 06/05/17 1531  AST 15  ALT 9*  ALKPHOS 64  BILITOT 1.8*  PROT 5.8*  ALBUMIN 2.9*   No results for input(s): LIPASE, AMYLASE in the last 168 hours. No results for input(s): AMMONIA in the last 168 hours. CBC: Recent Labs  Lab 06/05/17 1531 06/05/17 1553  WBC 8.9  --   NEUTROABS 5.4  --   HGB 14.0 13.6  HCT 41.3 40.0  MCV 88.1  --   PLT 178  --    Cardiac Enzymes: No results for input(s): CKTOTAL, CKMB, CKMBINDEX, TROPONINI in the last 168 hours.  BNP (last 3 results) No results for input(s): BNP in the last 8760 hours.  ProBNP (last 3 results) No results for input(s): PROBNP in the last 8760 hours.  CBG: No results for input(s): GLUCAP in the last 168 hours.  Radiological Exams on Admission: Dg Chest 2 View  Result Date: 06/05/2017 CLINICAL DATA:  history of diabetes, glioblastoma of the brain, hypertension hyperlipidemia comes in with chief complaint of generalized weakness.Patient here with her husband. Patient had an MRI 2 days ago which showed primary GBM. She was supposed to come into the hospital for biopsy and evaluation  for further treatment options. However husband reports that patient has progressively gotten weak, and he was unable to get her out of the bed and to the hospital today. Family called neurosurgery, and hospice team and they are advised to come to the ER. EXAM: CHEST  2 VIEW COMPARISON:  05/22/2017 FINDINGS: Cardiac silhouette is mildly enlarged. No mediastinal or hilar masses. No convincing adenopathy. There  are prominent bronchovascular markings. Lungs are mildly hyperexpanded. No evidence of pneumonia or pulmonary edema. No pleural effusion or pneumothorax. Skeletal structures are demineralized. IMPRESSION: 1. No acute cardiopulmonary disease. Stable appearance from the recent prior study. Electronically Signed   By: Lajean Manes M.D.   On: 06/05/2017 19:09   Ct Head W Or Wo Contrast  Result Date: 06/05/2017 CLINICAL DATA:  Brain tumor with altered mental status. EXAM: CT HEAD WITHOUT AND WITH CONTRAST TECHNIQUE: Contiguous axial images were obtained from the base of the skull through the vertex without and with intravenous contrast CONTRAST:  147m ISOVUE-300 IOPAMIDOL (ISOVUE-300) INJECTION 61% COMPARISON:  Brain MRI 06/03/2017 FINDINGS: Brain: Severe intracranial tumor burden is again demonstrated. The dominant mass adjacent to the atrium and occipital horn of the right lateral ventricle is unchanged in size, measuring approximately 5.0 x 3.5 cm, though the irregular margins limit the precision of the measurement. Small tumor foci within the anterior right temporal lobe are also unchanged. The dominant tumor component again extends to the midline corpus callosum splenium. Leftward midline shift at 9 mm is unchanged. No hydrocephalus. Vascular: No hyperdense vessel or unexpected calcification. Skull: Normal visualized skull base, calvarium and extracranial soft tissues. Sinuses/Orbits: No sinus fluid levels or advanced mucosal thickening. No mastoid effusion. Normal orbits. IMPRESSION: 1. Allowing for  differences in modality, unchanged appearance of posterior right hemispheric glioblastoma with satellite lesions in the anterior right temporal lobe. 2. Unchanged 9 mm leftward midline shift.  No hydrocephalus. Electronically Signed   By: KUlyses JarredM.D.   On: 06/05/2017 19:04    Assessment/Plan Present on Admission: . HLD (hyperlipidemia) . Hypertension . Brain mass . Hypokalemia  Active Problems:   Hypertension   Diabetes mellitus (HCC)   HLD (hyperlipidemia)   Brain mass   Goals of care, counseling/discussion   Generalized weakness   History of polymyalgia rheumatica   Hypokalemia   Generalized Weakness, most likely related to brain mass suspicious for glioblastoma Suspect most likely related to known brain mass suspicious for glioblastoma.  CT scan shows left midline shift unchanged from prior.  Less likely infectious etiology given no localizing symptoms, afebrile, no leukocytosis.  Patient does have history of polymyalgia rheumatica but this would be an atypical presentation as we will expect more proximal muscle weakness.  Does have some mild hypokalemia but would not expect this degree of weakness such a mild drop in potassium. Continue IV steroids in light of left midline shift Obtain ESR and CRP to evaluate possible PMR flare Continue IV Decadron, neurosurgery following, neuro oncology consulted by neurosurgery (awaiting recommendations later today or early morning tomorrow) Replete potassium UA   Recently diagnosed brain mass, concerning for glioblastoma, multifocal Plan for biopsy today however given decline in neurologic status neurosurgery will hold off on procedure for now CT scan of head shows no left midline shift.  No concerning cerebral edema Continue IV Decadron as recommended by neurosurgery.  Neuro oncology as mentioned above  Hypertension, not controlled Patient did not take BP meds today.  Systolics in the 1301S patient remains asymptomatic Restart home  amlodipine, carvedilol, Holding irbesartan, would like to be judicious in restarting home blood pressure medications given patient has been off for an unknown amount of time Continue to monitor on telemetry  T2DM A1c-9.7(01/2017), expect worsening in setting of IV steroids repeat A1c Home regimen includes metformin and Humulin 10 U BID,  Start on Lantus 5 U, monitor CBG, sliding scale as needed, diabetic diet Outpatient Endocrinologist, Dr. KDwyane Dee  Hyperlipidemia LDL 64 (02/2015) Hold in setting of evaluating PMR ( less likely but possible statin myopathy)  PMR Chronic prednisone ( outpatient Rheumatologist Dr. Jodi Geralds?).  As mentioned above atypical presentation of polymyalgia rheumatica flare.  Evaluate with a ESR and CRP. Holding home prednisone in the setting of IV steroids    DVT prophylaxis: SCDs   Code Status: DNR   Family Communication: Husband and daughter present at bedside and updated appropriately at the time of interview.   Disposition Plan: IV steroids.  Follow-up neurosurgery, neuro oncology, palliative care recs  Consults called: Neurosurgery, palliative care, neuro oncology  Admission status: Admitted as inpatient, to med-surge unit.      Desiree Hane MD [facility]  Pager [phone]  If 7PM-7AM, please contact night-coverage [email or web address] Password [facility]  06/05/2017, 9:13 PM

## 2017-06-05 NOTE — ED Notes (Signed)
Attempted to call report to [address] x 1. 

## 2017-06-06 ENCOUNTER — Other Ambulatory Visit: Payer: Self-pay

## 2017-06-06 ENCOUNTER — Encounter (HOSPITAL_COMMUNITY): Payer: Self-pay | Admitting: General Practice

## 2017-06-06 DIAGNOSIS — G939 Disorder of brain, unspecified: Secondary | ICD-10-CM

## 2017-06-06 DIAGNOSIS — Z800 Family history of malignant neoplasm of digestive organs: Secondary | ICD-10-CM

## 2017-06-06 DIAGNOSIS — Z87891 Personal history of nicotine dependence: Secondary | ICD-10-CM

## 2017-06-06 DIAGNOSIS — R627 Adult failure to thrive: Secondary | ICD-10-CM

## 2017-06-06 DIAGNOSIS — Z809 Family history of malignant neoplasm, unspecified: Secondary | ICD-10-CM

## 2017-06-06 DIAGNOSIS — Z7189 Other specified counseling: Secondary | ICD-10-CM

## 2017-06-06 LAB — BASIC METABOLIC PANEL
ANION GAP: 11 (ref 5–15)
BUN: 10 mg/dL (ref 6–20)
CO2: 18 mmol/L — ABNORMAL LOW (ref 22–32)
CREATININE: 0.7 mg/dL (ref 0.44–1.00)
Calcium: 8.5 mg/dL — ABNORMAL LOW (ref 8.9–10.3)
Chloride: 107 mmol/L (ref 101–111)
Glucose, Bld: 192 mg/dL — ABNORMAL HIGH (ref 65–99)
POTASSIUM: 3.8 mmol/L (ref 3.5–5.1)
Sodium: 136 mmol/L (ref 135–145)

## 2017-06-06 LAB — GLUCOSE, CAPILLARY
GLUCOSE-CAPILLARY: 185 mg/dL — AB (ref 65–99)
GLUCOSE-CAPILLARY: 200 mg/dL — AB (ref 65–99)
GLUCOSE-CAPILLARY: 226 mg/dL — AB (ref 65–99)
GLUCOSE-CAPILLARY: 290 mg/dL — AB (ref 65–99)
GLUCOSE-CAPILLARY: 355 mg/dL — AB (ref 65–99)
GLUCOSE-CAPILLARY: 432 mg/dL — AB (ref 65–99)
Glucose-Capillary: 269 mg/dL — ABNORMAL HIGH (ref 65–99)

## 2017-06-06 LAB — HEMOGLOBIN A1C
HEMOGLOBIN A1C: 7.5 % — AB (ref 4.8–5.6)
Mean Plasma Glucose: 168.55 mg/dL

## 2017-06-06 LAB — C-REACTIVE PROTEIN: CRP: 1.5 mg/dL — AB (ref <1.0)

## 2017-06-06 LAB — CBC
HCT: 41.8 % (ref 36.0–46.0)
Hemoglobin: 14.2 g/dL (ref 12.0–15.0)
MCH: 29.6 pg (ref 26.0–34.0)
MCHC: 34 g/dL (ref 30.0–36.0)
MCV: 87.3 fL (ref 78.0–100.0)
PLATELETS: 171 10*3/uL (ref 150–400)
RBC: 4.79 MIL/uL (ref 3.87–5.11)
RDW: 13.5 % (ref 11.5–15.5)
WBC: 4.3 10*3/uL (ref 4.0–10.5)

## 2017-06-06 LAB — MRSA PCR SCREENING: MRSA BY PCR: POSITIVE — AB

## 2017-06-06 MED ORDER — INSULIN ASPART 100 UNIT/ML ~~LOC~~ SOLN: 0.0000 [IU] | Freq: Every day | SUBCUTANEOUS | Status: DC

## 2017-06-06 MED ORDER — MUPIROCIN 2 % EX OINT
TOPICAL_OINTMENT | CUTANEOUS | Status: AC
  Filled 2017-06-06: qty 22

## 2017-06-06 MED ORDER — INSULIN ASPART 100 UNIT/ML ~~LOC~~ SOLN
7.0000 [IU] | Freq: Once | SUBCUTANEOUS | Status: AC
  Administered 2017-06-06: 7 [IU] via SUBCUTANEOUS

## 2017-06-06 MED ORDER — CHLORHEXIDINE GLUCONATE CLOTH 2 % EX PADS
6.0000 | MEDICATED_PAD | Freq: Every day | CUTANEOUS | Status: DC
  Administered 2017-06-07: 6 via TOPICAL

## 2017-06-06 MED ORDER — INSULIN ASPART 100 UNIT/ML ~~LOC~~ SOLN
0.0000 [IU] | Freq: Three times a day (TID) | SUBCUTANEOUS | Status: DC
  Administered 2017-06-06: 9 [IU] via SUBCUTANEOUS
  Administered 2017-06-06: 5 [IU] via SUBCUTANEOUS
  Administered 2017-06-07 (×2): 7 [IU] via SUBCUTANEOUS

## 2017-06-06 MED ORDER — LORAZEPAM 2 MG/ML IJ SOLN
0.5000 mg | Freq: Once | INTRAMUSCULAR | Status: AC
  Administered 2017-06-06: 0.5 mg via INTRAVENOUS
  Filled 2017-06-06: qty 1

## 2017-06-06 MED ORDER — ENSURE ENLIVE PO LIQD
237.0000 mL | Freq: Two times a day (BID) | ORAL | Status: DC
Start: 2017-06-06 — End: 2017-06-07
  Administered 2017-06-07: 237 mL via ORAL

## 2017-06-06 MED ORDER — SODIUM CHLORIDE 0.9 % IV SOLN: INTRAVENOUS | Status: DC

## 2017-06-06 MED ORDER — MUPIROCIN 2 % EX OINT
1.0000 "application " | TOPICAL_OINTMENT | Freq: Two times a day (BID) | CUTANEOUS | Status: DC
  Administered 2017-06-06 – 2017-06-07 (×2): 1 via NASAL

## 2017-06-06 MED ORDER — DEXTROSE 5 % IV SOLN
1.0000 g | INTRAVENOUS | Status: DC
  Administered 2017-06-07: 1 g via INTRAVENOUS
  Filled 2017-06-06 (×2): qty 10

## 2017-06-06 NOTE — Progress Notes (Signed)
[facility]                                                                              Patient Demographics  Nancy Glover, is a 83 y.o. female, DOB - May 21, 1934, HAF:[id number]  Admit date - 06/05/2017   Admitting Physician Desiree Hane, MD  Outpatient Primary MD for the patient is [name], MD  Outpatient specialists:   LOS - 1  days   Medical records reviewed and are as summarized below:    Chief Complaint  Patient presents with  . Weakness       Brief summary   Patient is a 83 year old female with type 2 diabetes, on insulin, PMR on chronic prednisone, hypertension, recently diagnosed glioblastoma in 05/2017, presented with worsening generalized weakness for last 3 days, confusion.  Patient had an outpatient MRI brain on 11/14 to evaluate her memory loss and was found to have multifocal nonenhancing brain tumor concerning for glioblastoma.  Repeat MRI on 11/17 showed primary brain tumor consistent with glioblastoma involving both hemispheres.  Husband reports memory loss and confusion for approximately last 2 months and in the last 2 weeks worsening loss of strength.  Patient admitted for further workup.    Assessment & Plan    Principal Problem:   Brain mass, concerning for glioblastoma multifocal -Continue IV Decadron, neurosurgery consulted, neuro oncology consulted -Patient was seen by Dr. Saintclair Halsted, patient was being worked up for stereotactic biopsy however given rapid decline, neurosurgery recommended no biopsy as she would not be candidate for any adjuvant therapy with her poor functional status. -Awaiting neuro oncology recommendations -Palliative consult also obtained for goals of care  Active Problems: Possible UTI -UA positive for ketones, trace leukocytes, positive nitrites, many bacteria, WBC 6-30 -Obtain urine culture and sensitivities (add on), for now placed on IV Rocephin  Dehydration -UA with ketones, poor appetite, placed on  IV fluids    Hypertension -Stable, continue amlodipine    Diabetes mellitus (HCC) -Hemoglobin A1c 7.5 -Continue Lantus 5 units, added sliding scale insulin    Generalized weakness -PT OT evaluation    History of polymyalgia rheumatica -Continue IV Decadron for now, on maintenance prednisone outpatient -ESR 19, CRP 1.5    Hypokalemia Resolved  Code Status: DNR DVT Prophylaxis: SCDs Family Communication: Discussed in detail with the patient, all imaging results, lab results explained to the patient's husband at the bedside   Disposition Plan:  Time Spent in minutes 35 minutes  Procedures:    Consultants:   Neurosurgery Neuro-oncology Palliative care  Antimicrobials:   IV Rocephin 11/20>   Medications  Scheduled Meds: . amLODipine  10 mg Oral Daily  . carvedilol  12.5 mg Oral BID WC  . dexamethasone  4 mg Intravenous Q6H  . docusate sodium  100 mg Oral BID  . insulin glargine  5 Units Subcutaneous QHS   Continuous Infusions: . sodium chloride    . cefTRIAXone (ROCEPHIN)  IV     PRN Meds:.acetaminophen, ondansetron **OR** ondansetron (ZOFRAN) IV, polyethylene glycol, traMADol   Antibiotics   Anti-infectives (From admission, onward)   Start     Dose/Rate Route Frequency Ordered Stop  06/06/17 0630  cefTRIAXone (ROCEPHIN) 1 g in dextrose 5 % 50 mL IVPB     1 g 100 mL/hr over 30 Minutes Intravenous Every 24 hours 06/06/17 6606          Subjective:   [name] was seen and examined today.  Somewhat confused, denies any pain.  No fevers or chills.  Husband at the bedside.  Difficult to obtain review of system from the patient.  Objective:   Vitals:   06/05/17 1900 06/06/17 0150 06/06/17 0345 06/06/17 0844  BP: (!) 183/60 (!) 158/62 (!) 139/54 (!) 155/53  Pulse: 74 83 77 74  Resp: 16  16   Temp: 98 F (36.7 C)  98.6 F (37 C)   TempSrc: Oral  Oral   SpO2: 95%  96%     Intake/Output Summary (Last 24 hours) at 06/06/2017 1005 Last data  filed at 06/06/2017 0934 Gross per 24 hour  Intake 120 ml  Output -  Net 120 ml     Wt Readings from Last 3 Encounters:  06/03/17 65.8 kg (145 lb)  05/24/17 67 kg (147 lb 12.8 oz)  05/22/17 65.8 kg (145 lb)     Exam  General: Alert and oriented x self and place, NAD, appears comfortable, ill-appearing  Eyes:  HEENT:  Atraumatic, normocephalic  Cardiovascular: S1 S2 auscultated, Regular rate and rhythm.  Respiratory: Clear to auscultation bilaterally, no wheezing, rales or rhonchi  Gastrointestinal: Soft, nontender, nondistended, + bowel sounds  Ext: no pedal edema bilaterally  Neuro: decreased strength in both upper and lower extremities, generalized  Musculoskeletal: No digital cyanosis, clubbing  Skin: No rashes  Psych: somewhat confused   Data Reviewed:  I have personally reviewed following labs and imaging studies  Micro Results No results found for this or any previous visit (from the past 240 hour(s)).  Radiology Reports Dg Chest 2 View  Result Date: 06/05/2017 CLINICAL DATA:  history of diabetes, glioblastoma of the brain, hypertension hyperlipidemia comes in with chief complaint of generalized weakness.Patient here with her husband. Patient had an MRI 2 days ago which showed primary GBM. She was supposed to come into the hospital for biopsy and evaluation for further treatment options. However husband reports that patient has progressively gotten weak, and he was unable to get her out of the bed and to the hospital today. Family called neurosurgery, and hospice team and they are advised to come to the ER. EXAM: CHEST  2 VIEW COMPARISON:  05/22/2017 FINDINGS: Cardiac silhouette is mildly enlarged. No mediastinal or hilar masses. No convincing adenopathy. There are prominent bronchovascular markings. Lungs are mildly hyperexpanded. No evidence of pneumonia or pulmonary edema. No pleural effusion or pneumothorax. Skeletal structures are demineralized. IMPRESSION:  1. No acute cardiopulmonary disease. Stable appearance from the recent prior study. Electronically Signed   By: Lajean Manes M.D.   On: 06/05/2017 19:09   Dg Chest 2 View  Result Date: 05/22/2017 CLINICAL DATA:  The patient reports not feeling well since beginning insulin last month. The patient reports difficulty breathing this morning associated with shortness of breath. History of hypertension, diabetes, former smoker. EXAM: CHEST  2 VIEW COMPARISON:  Chest x-ray of Nov 24, 2015 FINDINGS: The lungs are adequately inflated. There is no focal infiltrate. There is no pleural effusion. The cardiac silhouette is enlarged. The pulmonary vascularity is normal. There is calcification in the wall of the thoracic aorta. There is multilevel degenerative disc disease of the upper and midthoracic spine. There is old deformity of  the posterior aspect of the right eighth rib. IMPRESSION: There is no acute pneumonia. There are mild chronic bronchitic changes which are stable. There is stable cardiomegaly without pulmonary edema. Thoracic aortic atherosclerosis. Electronically Signed   By: David  Martinique M.D.   On: 05/22/2017 12:47   Ct Head W Or Wo Contrast  Result Date: 06/05/2017 CLINICAL DATA:  Brain tumor with altered mental status. EXAM: CT HEAD WITHOUT AND WITH CONTRAST TECHNIQUE: Contiguous axial images were obtained from the base of the skull through the vertex without and with intravenous contrast CONTRAST:  162m ISOVUE-300 IOPAMIDOL (ISOVUE-300) INJECTION 61% COMPARISON:  Brain MRI 06/03/2017 FINDINGS: Brain: Severe intracranial tumor burden is again demonstrated. The dominant mass adjacent to the atrium and occipital horn of the right lateral ventricle is unchanged in size, measuring approximately 5.0 x 3.5 cm, though the irregular margins limit the precision of the measurement. Small tumor foci within the anterior right temporal lobe are also unchanged. The dominant tumor component again extends to the  midline corpus callosum splenium. Leftward midline shift at 9 mm is unchanged. No hydrocephalus. Vascular: No hyperdense vessel or unexpected calcification. Skull: Normal visualized skull base, calvarium and extracranial soft tissues. Sinuses/Orbits: No sinus fluid levels or advanced mucosal thickening. No mastoid effusion. Normal orbits. IMPRESSION: 1. Allowing for differences in modality, unchanged appearance of posterior right hemispheric glioblastoma with satellite lesions in the anterior right temporal lobe. 2. Unchanged 9 mm leftward midline shift.  No hydrocephalus. Electronically Signed   By: KUlyses JarredM.D.   On: 06/05/2017 19:04   Mr BJeri CosWHCContrast  Result Date: 06/03/2017 CLINICAL DATA:  Known brain tumor.  Stereotactic planning. EXAM: MRI HEAD WITHOUT AND WITH CONTRAST TECHNIQUE: Multiplanar, multiecho pulse sequences of the brain and surrounding structures were obtained without and with intravenous contrast. CONTRAST:  171mMULTIHANCE GADOBENATE DIMEGLUMINE 529 MG/ML IV SOLN COMPARISON:  Brain MRI 3 days ago FINDINGS: Brain: Mass in the right hemisphere that has dense cellularity and necrosis. The bulkiest portion of the mass is centered around the atrium of the right lateral ventricle with infiltration across the splenium of the corpus callosum into the left periatrial white matter. There is patchy FLAIR hyperintensity in the white matter and cortex of the right cerebral hemisphere with mass effect diffusely, most consistent with gliomatosis. Right-sided sulci are diffusely effaced. Areas of enhancement and most high-grade tumor is present along the superficial and posterior right temporal cortex and around the atrium of the right lateral ventricle. No superimposed infarct or hemorrhage. No hydrocephalus, but the right lateral ventricle has increased in size compared to prior. This deviates the septum pellucidum compared to prior. Elsewhere, midline shift is more mildly progressed, as is  sulcal effacement along the right cerebral convexity. This is most likely rapidly progressing tumor as there is no superimposed hemorrhage, infarct, or focal edema. No obstructive process is seen to explain the change in right lateral ventricular volume. FLAIR hyperintensity in the left cerebral white matter is likely a combination of gliomatosis and chronic small vessel ischemia. Remote lacunar infarct in the left posterior frontal white matter. Small remote left inferior cerebellar infarct. Vascular: Major flow voids and vascular enhancements are preserved. Skull and upper cervical spine: Negative for marrow lesion. Sinuses/Orbits: Right cataract resection.  No acute finding. These results will be called to the ordering clinician or representative by the Radiologist Assistant, and communication documented in the PACS or zVision Dashboard. IMPRESSION: 1. Treatment planning scan re- demonstrates primary brain tumor consistent with glioblastoma involving  both hemispheres. There is tumor enhancement and necrosis about the atrium of the right lateral ventricle and scattered in the right temporal lobe. Gliomatosis seen throughout the right cerebral hemisphere. 2. Even though only 3 days since prior, there is progressive generalized mass effect on the right with further sulcal narrowing and midline displacement. No herniation. 3. The right lateral ventricular volume has increased from 3 days prior, although no obstructive process is seen. No overt hydrocephalus. Electronically Signed   By: Monte Fantasia M.D.   On: 06/03/2017 19:08   Mr Jeri Cos WJ Contrast  Result Date: 05/31/2017 CLINICAL DATA:  83 year old female with confusion, visual problems, difficulty walking, right side numbness, memory loss. Creatinine was obtained on site at [facility] at [address] Results: Creatinine 0.63m/dL. EXAM: MRI HEAD WITHOUT AND WITH CONTRAST TECHNIQUE: Multiplanar, multiecho pulse sequences of the brain and  surrounding structures were obtained without and with intravenous contrast. CONTRAST:  114mMULTIHANCE GADOBENATE DIMEGLUMINE 529 MG/ML IV SOLN COMPARISON:  Head CT without contrast 01/05/2012. FINDINGS: Brain: Extensive abnormal signal in the right cerebral hemisphere with mild to moderate associated mass effect. Centered about the atrium and occipital horn of the right lateral ventricle is a nodular heterogeneously enhancing dominant mass with enhancing component encompassing 66 x 50 x 50 mm (AP by transverse by CC). There is superimposed discontinuous abnormal nodular enhancement continuing into the right temporal lobe as far as the anterior right superior or middle temporal gyrus (series 13, image 64). The dominant lesion demonstrates associated mineralization or blood products in the right occipital periventricular white matter. The abnormal enhancement of the dominant lesion extends to the midline in the splenium of the corpus callosum. The splenium is expanded, and abnormal T2 and FLAIR hyperintensity tracks across midline in the splenium to the left periventricular white matter (series 9, image 13), and this is associated with abnormal diffusion suggesting hyper cellularity (series 4, image 28). Much of the dominant portion of the mass demonstrates a mix of mildly restricted and facilitated diffusion. Almost all of the mass mass like areas are T2 hyperintense. Additionally there is abnormal masslike T2 and FLAIR hyperintense signal elsewhere in the right hemisphere without associated enhancement, including in the anterior right frontal lobe (series 10, image 15) and also the right parietal lobe (series 10, image 21). Additional bilateral probably chronic small vessel disease related T2 and FLAIR hyperintensity is superimposed such that in areas it is difficult to determine what bilateral hemispheric FLAIR signal might reflect nonenhancing tumor. Widespread T2 heterogeneity in the bilateral deep gray matter  chronic small vessel disease and perivascular spaces then tumor. Similar nuclei more resembles T2 heterogeneity in the pons and inferior cerebellum. Overall there is mild to moderate mass effect on the right lateral ventricle with trace leftward midline shift at the central aspect of the hemispheres. No ventriculomegaly. No extra-axial hemorrhage or fluid collection. Patent basilar cisterns. Negative pituitary and cervicomedullary junction. No dural thickening. Vascular: Major intracranial vascular flow voids are preserved. Skull and upper cervical spine: Normal bone marrow signal. Negative visualized cervical spine. Sinuses/Orbits: Negative aside from postoperative changes to the right globe. Other: Visible internal auditory structures appear normal. Scalp and face soft tissues appear negative. IMPRESSION: 1. Extensive primary brain tumor in the right hemisphere, including multifocal high-grade tumor with enhancement tracking from the anterior middle temporal gyrus into the right occipital, posterior frontal, and parietal lobes. Extension of nonenhancing tumor across midline via the splenium of the corpus callosum, and suspected multifocal nonenhancing tumor elsewhere.  2. Favor Glioblastoma with gliomatosis type brain involvement. Alternatively, Primary CNS Lymphoma is possible but less likely. 3. Superimposed chronic small vessel disease throughout the brain. 4. Study discussed by telephone with Dr. [name] on 05/31/2017 at 13:19 . Electronically Signed   By: Genevie Ann M.D.   On: 05/31/2017 13:23    Lab Data:  CBC: Recent Labs  Lab 06/05/17 1531 06/05/17 1553 06/06/17 0602  WBC 8.9  --  4.3  NEUTROABS 5.4  --   --   HGB 14.0 13.6 14.2  HCT 41.3 40.0 41.8  MCV 88.1  --  87.3  PLT 178  --  655   Basic Metabolic Panel: Recent Labs  Lab 06/05/17 1553 06/05/17 2053 06/06/17 0602  NA 137  --  136  K 3.2*  --  3.8  CL 101  --  107  CO2  --   --  18*  GLUCOSE 101*  --  192*  BUN 8  --  10    CREATININE 0.60  --  0.70  CALCIUM  --   --  8.5*  MG  --  1.7  --    GFR: Estimated Creatinine Clearance: 50.3 mL/min (by C-G formula based on SCr of 0.7 mg/dL). Liver Function Tests: Recent Labs  Lab 06/05/17 1531  AST 15  ALT 9*  ALKPHOS 64  BILITOT 1.8*  PROT 5.8*  ALBUMIN 2.9*   No results for input(s): LIPASE, AMYLASE in the last 168 hours. No results for input(s): AMMONIA in the last 168 hours. Coagulation Profile: No results for input(s): INR, PROTIME in the last 168 hours. Cardiac Enzymes: No results for input(s): CKTOTAL, CKMB, CKMBINDEX, TROPONINI in the last 168 hours. BNP (last 3 results) No results for input(s): PROBNP in the last 8760 hours. HbA1C: Recent Labs    06/05/17 2044  HGBA1C 7.5*   CBG: Recent Labs  Lab 06/05/17 2103 06/06/17 0039 06/06/17 0442 06/06/17 0731  GLUCAP 119* 200* 226* 185*   Lipid Profile: No results for input(s): CHOL, HDL, LDLCALC, TRIG, CHOLHDL, LDLDIRECT in the last 72 hours. Thyroid Function Tests: No results for input(s): TSH, T4TOTAL, FREET4, T3FREE, THYROIDAB in the last 72 hours. Anemia Panel: No results for input(s): VITAMINB12, FOLATE, FERRITIN, TIBC, IRON, RETICCTPCT in the last 72 hours. Urine analysis:    Component Value Date/Time   COLORURINE YELLOW 06/05/2017 2250   APPEARANCEUR CLEAR 06/05/2017 2250   LABSPEC 1.044 (H) 06/05/2017 2250   PHURINE 5.0 06/05/2017 2250   GLUCOSEU NEGATIVE 06/05/2017 2250   GLUCOSEU NEGATIVE 05/24/2017 1358   HGBUR SMALL (A) 06/05/2017 2250   BILIRUBINUR NEGATIVE 06/05/2017 2250   KETONESUR 80 (A) 06/05/2017 2250   PROTEINUR NEGATIVE 06/05/2017 2250   UROBILINOGEN 1.0 05/24/2017 1358   NITRITE POSITIVE (A) 06/05/2017 2250   LEUKOCYTESUR TRACE (A) 06/05/2017 2250     Athan Casalino M.D. [facility] 06/06/2017, 10:05 AM  Pager: [phone] Between 7am to 7pm - call Pager - [phone]  After 7pm go to [email or web address] - password [email or web address]  Call night coverage person  covering after 7pm

## 2017-06-06 NOTE — Consult Note (Signed)
[facility] NOTE  Patient Care Team: [name], MD as PCP - General (Family Medicine)  CHIEF COMPLAINTS/PURPOSE OF CONSULTATION:  Brain mass  HISTORY OF PRESENTING ILLNESS:  Nancy Glover 83 y.o. female presented to her primary care physician earlier this month confusion and change in behaviors noted at home.  She obtained an MRI brain, which demonstrated a large multifocal necrotic tumor with crossing of midline.  Initial recommendation was made for follow up with neurosurgery, but rapid functional decline prompted the current admission.  She is mostly bed-bound at this time, not independent with function and eating very little.  These changes have been progressive over weeks.  She otherwise denies seizures, headaches.  Today significant amounts of history were provided by her friends/neighbors who were bedside.    MEDICAL HISTORY:  Past Medical History:  Diagnosis Date  . Brain tumor ([place])   . Diabetes mellitus, type 2 ([place])   . Hyperlipidemia   . Hypertension   . Macular degeneration of left eye   . PMR (polymyalgia rheumatica) ([place]) 2003  . PONV (postoperative nausea and vomiting)   . Ulcer of right leg ([place])   . Venous insufficiency, peripheral     SURGICAL HISTORY: Past Surgical History:  Procedure Laterality Date  . CATARACT EXTRACTION W/ INTRAOCULAR LENS IMPLANT Right   . CERVICAL BIOPSY  W/ LOOP ELECTRODE EXCISION  01-17-2001  DR LOMAX  . CYSTO/ HOD/ BLADDER BX  10-23-2006  DR PETERSON  . CYSTOSCOPY WITH STENT PLACEMENT Left 11/24/2015   Procedure: CYSTOSCOPY WITH STENT PLACEMENT left ureter left retrograde pylegram;  Surgeon: Ardis Hughs, MD;  Location: [facility];  Service: Urology;  Laterality: Left;  . ESOPHAGOGASTRODUODENOSCOPY N/A 09/15/2012   Procedure: ESOPHAGOGASTRODUODENOSCOPY (EGD);  Surgeon: Winfield Cunas., MD;  Location: [facility];  Service: Endoscopy;  Laterality: N/A;  . EYE SURGERY     rt cataract  . FRACTURE SURGERY   1975   lt elbow-fx  . SKIN SPLIT GRAFT Left 04/19/2013   Procedure: SKIN GRAFT FULL THICKNESS TO LEFT LEG FROM ABDOMEN;  Surgeon: Pedro Earls, MD;  Location: [facility];  Service: General;  Laterality: Left;  . TONSILLECTOMY      SOCIAL HISTORY: Social History   Socioeconomic History  . Marital status: Married    Spouse name: Not on file  . Number of children: Not on file  . Years of education: Not on file  . Highest education level: Not on file  Social Needs  . Financial resource strain: Not on file  . Food insecurity - worry: Not on file  . Food insecurity - inability: Not on file  . Transportation needs - medical: Not on file  . Transportation needs - non-medical: Not on file  Occupational History  . Not on file  Tobacco Use  . Smoking status: Former Smoker    Packs/day: 1.00    Years: 40.00    Pack years: 40.00    Types: Cigarettes    Last attempt to quit: 09/13/1991    Years since quitting: 25.7  . Smokeless tobacco: Never Used  Substance and Sexual Activity  . Alcohol use: No  . Drug use: No  . Sexual activity: No  Other Topics Concern  . Not on file  Social History Narrative  . Not on file    FAMILY HISTORY: Family History  Problem Relation Age of Onset  . Colon cancer Father   . Heart disease Father   . Hypertension Father   .  Heart attack Father   . Cancer Father        unknown type cancer   . Heart disease Mother   . Anemia Mother   . Diabetes Neg Hx     ALLERGIES:  is allergic to actos [pioglitazone]; linagliptin; and sulfa antibiotics.  MEDICATIONS:  Current Facility-Administered Medications  Medication Dose Route Frequency Provider Last Rate Last Dose  . 0.9 %  sodium chloride infusion   Intravenous Continuous Rai, Ripudeep K, MD      . acetaminophen (TYLENOL) tablet 650 mg  650 mg Oral Q6H PRN Oretha Milch D, MD      . amLODipine (NORVASC) tablet 10 mg  10 mg Oral Daily Oretha Milch D, MD   10 mg at 06/06/17 0845  .  carvedilol (COREG) tablet 12.5 mg  12.5 mg Oral BID WC Oretha Milch D, MD   12.5 mg at 06/06/17 0844  . cefTRIAXone (ROCEPHIN) 1 g in dextrose 5 % 50 mL IVPB  1 g Intravenous Q24H Rai, Ripudeep K, MD      . dexamethasone (DECADRON) injection 4 mg  4 mg Intravenous Q6H Oretha Milch D, MD   4 mg at 06/06/17 0548  . docusate sodium (COLACE) capsule 100 mg  100 mg Oral BID Oretha Milch D, MD   100 mg at 06/06/17 0845  . insulin aspart (novoLOG) injection 0-5 Units  0-5 Units Subcutaneous QHS Rai, Ripudeep K, MD      . insulin aspart (novoLOG) injection 0-9 Units  0-9 Units Subcutaneous TID WC Rai, Ripudeep K, MD      . insulin glargine (LANTUS) injection 5 Units  5 Units Subcutaneous QHS Oretha Milch D, MD   5 Units at 06/05/17 2145  . ondansetron (ZOFRAN) tablet 4 mg  4 mg Oral Q6H PRN Oretha Milch D, MD       Or  . ondansetron (ZOFRAN) injection 4 mg  4 mg Intravenous Q6H PRN Oretha Milch D, MD      . polyethylene glycol (MIRALAX / GLYCOLAX) packet 17 g  17 g Oral Daily PRN Oretha Milch D, MD      . traMADol (ULTRAM) tablet 50 mg  50 mg Oral Q6H PRN Desiree Hane, MD        REVIEW OF SYSTEMS:   Limited by AMS, poor insight.  PHYSICAL EXAMINATION: Vitals:   06/06/17 0345 06/06/17 0844  BP: (!) 139/54 (!) 155/53  Pulse: 77 74  Resp: 16   Temp: 98.6 F (37 C)   SpO2: 96%    KPS: 50. General: Awake, no acute distress Head: normal EENT: dry mucous membranes Lungs: Resp effort normal Cardiac: Regular rate and rhythm Abdomen: Soft, non-distended abdomen Skin: No rashes cyanosis or petechiae. Extremities: No clubbing or edema  NEUROLOGIC EXAM: Awake, attentive.  Some drowsiness.  Limited insight and comprehension into details of current circumstance.   Eye movements full.  Arms and legs both moving, antigravity.  Reflexes limited. Nonambulatory.  LABORATORY DATA:  I have reviewed the data as listed Lab Results  Component Value Date   WBC 4.3 06/06/2017   HGB 14.2  06/06/2017   HCT 41.8 06/06/2017   MCV 87.3 06/06/2017   PLT 171 06/06/2017   Recent Labs    05/10/17 1315 05/22/17 1115 05/24/17 1358 06/05/17 1531 06/05/17 1553 06/06/17 0602  NA 135 135 135  --  137 136  K 3.7 3.8 3.6  --  3.2* 3.8  CL 103 103 100  --  101 107  CO2  25 24 26   --   --  18*  GLUCOSE 254* 128* 268*  268*  --  101* 192*  BUN 13 11 17   --  8 10  CREATININE 0.75 0.66 0.84  --  0.60 0.70  CALCIUM 9.0 8.5* 9.3  --   --  8.5*  GFRNONAA >60 >60  --   --   --  >60  GFRAA >60 >60  --   --   --  >60  PROT 6.0* 5.9*  --  5.8*  --   --   ALBUMIN 3.3* 3.1*  --  2.9*  --   --   AST 15 17  --  15  --   --   ALT 13* 13*  --  9*  --   --   ALKPHOS 79 72  --  64  --   --   BILITOT 1.4* 1.8*  --  1.8*  --   --   BILIDIR  --   --   --  0.3  --   --   IBILI  --   --   --  1.5*  --   --     RADIOGRAPHIC STUDIES: I have personally reviewed the radiological images as listed and agreed with the findings in the report. Dg Chest 2 View  Result Date: 06/05/2017 CLINICAL DATA:  history of diabetes, glioblastoma of the brain, hypertension hyperlipidemia comes in with chief complaint of generalized weakness.Patient here with her husband. Patient had an MRI 2 days ago which showed primary GBM. She was supposed to come into the hospital for biopsy and evaluation for further treatment options. However husband reports that patient has progressively gotten weak, and he was unable to get her out of the bed and to the hospital today. Family called neurosurgery, and hospice team and they are advised to come to the ER. EXAM: CHEST  2 VIEW COMPARISON:  05/22/2017 FINDINGS: Cardiac silhouette is mildly enlarged. No mediastinal or hilar masses. No convincing adenopathy. There are prominent bronchovascular markings. Lungs are mildly hyperexpanded. No evidence of pneumonia or pulmonary edema. No pleural effusion or pneumothorax. Skeletal structures are demineralized. IMPRESSION: 1. No acute cardiopulmonary  disease. Stable appearance from the recent prior study. Electronically Signed   By: Lajean Manes M.D.   On: 06/05/2017 19:09   Dg Chest 2 View  Result Date: 05/22/2017 CLINICAL DATA:  The patient reports not feeling well since beginning insulin last month. The patient reports difficulty breathing this morning associated with shortness of breath. History of hypertension, diabetes, former smoker. EXAM: CHEST  2 VIEW COMPARISON:  Chest x-ray of Nov 24, 2015 FINDINGS: The lungs are adequately inflated. There is no focal infiltrate. There is no pleural effusion. The cardiac silhouette is enlarged. The pulmonary vascularity is normal. There is calcification in the wall of the thoracic aorta. There is multilevel degenerative disc disease of the upper and midthoracic spine. There is old deformity of the posterior aspect of the right eighth rib. IMPRESSION: There is no acute pneumonia. There are mild chronic bronchitic changes which are stable. There is stable cardiomegaly without pulmonary edema. Thoracic aortic atherosclerosis. Electronically Signed   By: David  Martinique M.D.   On: 05/22/2017 12:47   Ct Head W Or Wo Contrast  Result Date: 06/05/2017 CLINICAL DATA:  Brain tumor with altered mental status. EXAM: CT HEAD WITHOUT AND WITH CONTRAST TECHNIQUE: Contiguous axial images were obtained from the base of the skull through the vertex without and with intravenous  contrast CONTRAST:  163mL ISOVUE-300 IOPAMIDOL (ISOVUE-300) INJECTION 61% COMPARISON:  Brain MRI 06/03/2017 FINDINGS: Brain: Severe intracranial tumor burden is again demonstrated. The dominant mass adjacent to the atrium and occipital horn of the right lateral ventricle is unchanged in size, measuring approximately 5.0 x 3.5 cm, though the irregular margins limit the precision of the measurement. Small tumor foci within the anterior right temporal lobe are also unchanged. The dominant tumor component again extends to the midline corpus callosum splenium.  Leftward midline shift at 9 mm is unchanged. No hydrocephalus. Vascular: No hyperdense vessel or unexpected calcification. Skull: Normal visualized skull base, calvarium and extracranial soft tissues. Sinuses/Orbits: No sinus fluid levels or advanced mucosal thickening. No mastoid effusion. Normal orbits. IMPRESSION: 1. Allowing for differences in modality, unchanged appearance of posterior right hemispheric glioblastoma with satellite lesions in the anterior right temporal lobe. 2. Unchanged 9 mm leftward midline shift.  No hydrocephalus. Electronically Signed   By: Ulyses Jarred M.D.   On: 06/05/2017 19:04   Mr Jeri Cos QB Contrast  Result Date: 06/03/2017 CLINICAL DATA:  Known brain tumor.  Stereotactic planning. EXAM: MRI HEAD WITHOUT AND WITH CONTRAST TECHNIQUE: Multiplanar, multiecho pulse sequences of the brain and surrounding structures were obtained without and with intravenous contrast. CONTRAST:  58mL MULTIHANCE GADOBENATE DIMEGLUMINE 529 MG/ML IV SOLN COMPARISON:  Brain MRI 3 days ago FINDINGS: Brain: Mass in the right hemisphere that has dense cellularity and necrosis. The bulkiest portion of the mass is centered around the atrium of the right lateral ventricle with infiltration across the splenium of the corpus callosum into the left periatrial white matter. There is patchy FLAIR hyperintensity in the white matter and cortex of the right cerebral hemisphere with mass effect diffusely, most consistent with gliomatosis. Right-sided sulci are diffusely effaced. Areas of enhancement and most high-grade tumor is present along the superficial and posterior right temporal cortex and around the atrium of the right lateral ventricle. No superimposed infarct or hemorrhage. No hydrocephalus, but the right lateral ventricle has increased in size compared to prior. This deviates the septum pellucidum compared to prior. Elsewhere, midline shift is more mildly progressed, as is sulcal effacement along the right  cerebral convexity. This is most likely rapidly progressing tumor as there is no superimposed hemorrhage, infarct, or focal edema. No obstructive process is seen to explain the change in right lateral ventricular volume. FLAIR hyperintensity in the left cerebral white matter is likely a combination of gliomatosis and chronic small vessel ischemia. Remote lacunar infarct in the left posterior frontal white matter. Small remote left inferior cerebellar infarct. Vascular: Major flow voids and vascular enhancements are preserved. Skull and upper cervical spine: Negative for marrow lesion. Sinuses/Orbits: Right cataract resection.  No acute finding. These results will be called to the ordering clinician or representative by the Radiologist Assistant, and communication documented in the PACS or zVision Dashboard. IMPRESSION: 1. Treatment planning scan re- demonstrates primary brain tumor consistent with glioblastoma involving both hemispheres. There is tumor enhancement and necrosis about the atrium of the right lateral ventricle and scattered in the right temporal lobe. Gliomatosis seen throughout the right cerebral hemisphere. 2. Even though only 3 days since prior, there is progressive generalized mass effect on the right with further sulcal narrowing and midline displacement. No herniation. 3. The right lateral ventricular volume has increased from 3 days prior, although no obstructive process is seen. No overt hydrocephalus. Electronically Signed   By: Monte Fantasia M.D.   On: 06/03/2017 19:08   Mr  Brain W Wo Contrast  Result Date: 05/31/2017 CLINICAL DATA:  83 year old female with confusion, visual problems, difficulty walking, right side numbness, memory loss. Creatinine was obtained on site at [facility] at [address] Results: Creatinine 0.7mg /dL. EXAM: MRI HEAD WITHOUT AND WITH CONTRAST TECHNIQUE: Multiplanar, multiecho pulse sequences of the brain and surrounding structures were obtained  without and with intravenous contrast. CONTRAST:  7mL MULTIHANCE GADOBENATE DIMEGLUMINE 529 MG/ML IV SOLN COMPARISON:  Head CT without contrast 01/05/2012. FINDINGS: Brain: Extensive abnormal signal in the right cerebral hemisphere with mild to moderate associated mass effect. Centered about the atrium and occipital horn of the right lateral ventricle is a nodular heterogeneously enhancing dominant mass with enhancing component encompassing 66 x 50 x 50 mm (AP by transverse by CC). There is superimposed discontinuous abnormal nodular enhancement continuing into the right temporal lobe as far as the anterior right superior or middle temporal gyrus (series 13, image 64). The dominant lesion demonstrates associated mineralization or blood products in the right occipital periventricular white matter. The abnormal enhancement of the dominant lesion extends to the midline in the splenium of the corpus callosum. The splenium is expanded, and abnormal T2 and FLAIR hyperintensity tracks across midline in the splenium to the left periventricular white matter (series 9, image 13), and this is associated with abnormal diffusion suggesting hyper cellularity (series 4, image 28). Much of the dominant portion of the mass demonstrates a mix of mildly restricted and facilitated diffusion. Almost all of the mass mass like areas are T2 hyperintense. Additionally there is abnormal masslike T2 and FLAIR hyperintense signal elsewhere in the right hemisphere without associated enhancement, including in the anterior right frontal lobe (series 10, image 15) and also the right parietal lobe (series 10, image 21). Additional bilateral probably chronic small vessel disease related T2 and FLAIR hyperintensity is superimposed such that in areas it is difficult to determine what bilateral hemispheric FLAIR signal might reflect nonenhancing tumor. Widespread T2 heterogeneity in the bilateral deep gray matter chronic small vessel disease and  perivascular spaces then tumor. Similar nuclei more resembles T2 heterogeneity in the pons and inferior cerebellum. Overall there is mild to moderate mass effect on the right lateral ventricle with trace leftward midline shift at the central aspect of the hemispheres. No ventriculomegaly. No extra-axial hemorrhage or fluid collection. Patent basilar cisterns. Negative pituitary and cervicomedullary junction. No dural thickening. Vascular: Major intracranial vascular flow voids are preserved. Skull and upper cervical spine: Normal bone marrow signal. Negative visualized cervical spine. Sinuses/Orbits: Negative aside from postoperative changes to the right globe. Other: Visible internal auditory structures appear normal. Scalp and face soft tissues appear negative. IMPRESSION: 1. Extensive primary brain tumor in the right hemisphere, including multifocal high-grade tumor with enhancement tracking from the anterior middle temporal gyrus into the right occipital, posterior frontal, and parietal lobes. Extension of nonenhancing tumor across midline via the splenium of the corpus callosum, and suspected multifocal nonenhancing tumor elsewhere. 2. Favor Glioblastoma with gliomatosis type brain involvement. Alternatively, Primary CNS Lymphoma is possible but less likely. 3. Superimposed chronic small vessel disease throughout the brain. 4. Study discussed by telephone with Dr. [name] on 05/31/2017 at 13:19 . Electronically Signed   By: Genevie Ann M.D.   On: 05/31/2017 13:23    ASSESSMENT & PLAN:   Brain Mass  Ms. Sereno' case was reviewed on 11/18 in multi-disciplinary brain tumor board at the cancer center.    Her tumor is very likely an aggressive high grade glioma.  The size and location, combined with her age and poor functional status, portend short survival and limited benefit from therapy.  I do not recommend she move forward with a biopsy at this time.  I will plan to meet in person with Nancy Glover  and her husband for a more formal goals of care discussion, likely tomorrow when he returns to bedside.  In the meantime, treatment of systemic issues should continue, including current high dose dexamethasone.  During hospitalization, palliative care consult would be appropriate, and contributions to family meeting would be appreciated..      The total time spent in the encounter was 80 minutes and more than 50% was on counseling and review of test results     Ventura Sellers, MD 06/06/2017 1:17 PM

## 2017-06-06 NOTE — Progress Notes (Signed)
Daily Progress Note   Patient Name: Nancy Glover       Date: 06/06/2017 DOB: 03-26-1934  Age: 83 y.o. MRN#: [id number] Attending Physician: Mendel Corning, MD Primary Care Physician: [name], MD Admit Date: 06/05/2017  Reason for Consultation/Follow-up: Establishing goals of care  Subjective:  Patient in bed. Sister in law and her neighbors are at bedside. Denies pain. Tells me she is sad. States she's "not ready to go". Says she is worried about her husband. She is having some confusion- does not remember eating her lunch. Confuses me for another family member.   I met privately with Shanon Brow after he arrived at bedside (he brought Eda a picture of them square dancing and one of her square dancing skirts that she requested). He says he doesn't think his wife would survive any surgery or treatment. He is tearful. I answered his questions about options for Hospice - residential and at nursing facility. He states [place] would never want to go to a SNF. I believe she would be eligible for residential Hospice. Followup meeting arranged for tomorrow morning at 10am with Dr. Mickeal Skinner.  Review of Systems  Constitutional: Positive for malaise/fatigue and weight loss.  Eyes: Positive for blurred vision.  Gastrointestinal: Negative for nausea and vomiting.  Neurological: Positive for focal weakness and weakness.    Length of Stay: 1  Current Medications: Scheduled Meds:  . amLODipine  10 mg Oral Daily  . carvedilol  12.5 mg Oral BID WC  . dexamethasone  4 mg Intravenous Q6H  . docusate sodium  100 mg Oral BID  . feeding supplement (ENSURE ENLIVE)  237 mL Oral BID BM  . insulin aspart  0-5 Units Subcutaneous QHS  . insulin aspart  0-9 Units Subcutaneous TID WC  . insulin glargine  5 Units  Subcutaneous QHS    Continuous Infusions: . sodium chloride    . cefTRIAXone (ROCEPHIN)  IV      PRN Meds: acetaminophen, ondansetron **OR** ondansetron (ZOFRAN) IV, polyethylene glycol, traMADol  Physical Exam  Constitutional:  frail  Cardiovascular: Normal rate and regular rhythm.  Pulmonary/Chest: Effort normal and breath sounds normal.  Neurological: She is alert.  Psychiatric: She has a normal mood and affect. Her behavior is normal.  Confused at times  Nursing note and vitals reviewed.  Vital Signs: BP (!) 133/54 (BP Location: Left Arm)   Pulse 71   Temp 98.5 F (36.9 C) (Oral)   Resp 18   SpO2 95%  SpO2: SpO2: 95 % O2 Device: O2 Device: Not Delivered O2 Flow Rate:    Intake/output summary:   Intake/Output Summary (Last 24 hours) at 06/06/2017 1656 Last data filed at 06/06/2017 1356 Gross per 24 hour  Intake 360 ml  Output 700 ml  Net -340 ml   LBM: Last BM Date: 06/04/17 Baseline Weight:   Most recent weight:         Palliative Assessment/Data: PPS: 20%   Flowsheet Rows     Most Recent Value  Intake Tab  Referral Department  -- [ED]  Palliative Care Primary Diagnosis  Cancer  Date Notified  06/05/17  Palliative Care Type  New Palliative care  Reason for referral  Clarify Goals of Care  Date of Admission  06/04/17  Date first seen by Palliative Care  06/05/17  # of days Palliative referral response time  0 Day(s)  # of days IP prior to Palliative referral  1  Clinical Assessment  Psychosocial & Spiritual Assessment  Palliative Care Outcomes      Patient Active Problem List   Diagnosis Date Noted  . Generalized weakness 06/05/2017  . History of polymyalgia rheumatica 06/05/2017  . Hypokalemia 06/05/2017  . Brain mass   . Palliative care by specialist   . Goals of care, counseling/discussion   . Iron deficiency anemia 07/21/2016  . Sepsis ([place]) 11/24/2015  . Nephrolithiasis 11/24/2015  . Lactic acidosis 11/24/2015  . SOB  (shortness of breath) 11/24/2015  . Nausea with vomiting 11/24/2015  . HLD (hyperlipidemia) 11/24/2015  . GERD without esophagitis 11/24/2015  . Status post skin graft-left pretibial region 04/24/2013  . Peripheral vascular disease, unspecified ([place]) 10/30/2012  . Atherosclerosis of native arteries of the extremities with ulceration(440.23) 10/30/2012  . Vitamin B12 deficiency anemia 09/17/2012  . Hypertension 09/14/2012  . Diabetes mellitus ([place]) 09/14/2012  . Ulcer of right leg ([place]) 09/14/2012  . Anemia 09/14/2012    Palliative Care Assessment & Plan   Patient Profile: 83 y.o. female  with past medical history of DMII, HTN, HLD, and recent finding of brain mass (likely GBM) on MRI presented to the ED on 06/05/17 with complaints of progressive weakness and general decline. She was scheduled to have a biopsy of mass, however, this was canceled due to patient's functional decline. Palliative medicine consulted for [facility].   Assessment/Recommendations/Plan   F/U GOC meeting planned for tomorrow morning at 10am  Per Oncology- patient likely has aggressive glioblastoma and would receive little benefit from therapy, biopsy not recommended   Plan to continue current plan of care and further discuss tomorrow morning GOC and possible transition to Hospice, comfort measures, and disposition  Pt requests no more lab draws until [place] meeting  Code Status:  DNR  Prognosis:   < 2 weeks d/t aggressive brain mass, no options for treatment due to significant functional decline- not candidate for biopsy and therapy- likely transition to Hospice   Discharge Planning:  To Be Determined  Care plan was discussed with patient's spouse- Shanon Brow.  Thank you for allowing the Palliative Medicine Team to assist in the care of this patient.   Time In: 1515 Time Out: 1600 Total Time 45 mins Prolonged Time Billed no      Greater than 50%  of this time was spent counseling and coordinating care related  to the  above assessment and plan.  Mariana Kaufman, AGNP-C Palliative Medicine   Please contact Palliative Medicine Team phone at [phone] for questions and concerns.

## 2017-06-06 NOTE — Plan of Care (Signed)
  Pain Managment: General experience of comfort will improve 06/06/2017 0210 - Progressing by Mickie Kay, RN

## 2017-06-07 ENCOUNTER — Inpatient Hospital Stay (HOSPITAL_COMMUNITY): Admission: RE | Admit: 2017-06-07 | Payer: Medicare Other | Source: Ambulatory Visit | Admitting: Neurosurgery

## 2017-06-07 DIAGNOSIS — R627 Adult failure to thrive: Secondary | ICD-10-CM

## 2017-06-07 DIAGNOSIS — Z7189 Other specified counseling: Secondary | ICD-10-CM

## 2017-06-07 HISTORY — DX: Neoplasm of unspecified behavior of brain: D49.6

## 2017-06-07 LAB — BASIC METABOLIC PANEL
ANION GAP: 9 (ref 5–15)
BUN: 23 mg/dL — AB (ref 6–20)
CO2: 23 mmol/L (ref 22–32)
Calcium: 8.9 mg/dL (ref 8.9–10.3)
Chloride: 105 mmol/L (ref 101–111)
Creatinine, Ser: 0.77 mg/dL (ref 0.44–1.00)
GFR calc Af Amer: >60 mL/min (ref >60)
GFR calc non Af Amer: >60 mL/min (ref >60)
GLUCOSE: 339 mg/dL — AB (ref 65–99)
POTASSIUM: 4.1 mmol/L (ref 3.5–5.1)
Sodium: 137 mmol/L (ref 135–145)

## 2017-06-07 LAB — CBC
HEMATOCRIT: 41.1 % (ref 36.0–46.0)
Hemoglobin: 13.9 g/dL (ref 12.0–15.0)
MCH: 29.5 pg (ref 26.0–34.0)
MCHC: 33.8 g/dL (ref 30.0–36.0)
MCV: 87.3 fL (ref 78.0–100.0)
PLATELETS: 174 10*3/uL (ref 150–400)
RBC: 4.71 MIL/uL (ref 3.87–5.11)
RDW: 13.7 % (ref 11.5–15.5)
WBC: 6.1 10*3/uL (ref 4.0–10.5)

## 2017-06-07 LAB — GLUCOSE, CAPILLARY
GLUCOSE-CAPILLARY: 335 mg/dL — AB (ref 65–99)
Glucose-Capillary: 348 mg/dL — ABNORMAL HIGH (ref 65–99)

## 2017-06-07 SURGERY — FRAMELESS STEREOTACTIC BIOPSY
Anesthesia: General | Laterality: Right

## 2017-06-07 MED ORDER — MORPHINE SULFATE (CONCENTRATE) 10 MG/0.5ML PO SOLN: 5.0000 mg | ORAL | Status: DC | PRN

## 2017-06-07 MED ORDER — HALOPERIDOL LACTATE 5 MG/ML IJ SOLN: 0.5000 mg | INTRAMUSCULAR | Status: DC | PRN

## 2017-06-07 MED ORDER — DEXAMETHASONE 4 MG PO TABS: 4.0000 mg | ORAL_TABLET | Freq: Two times a day (BID) | ORAL | Status: AC

## 2017-06-07 MED ORDER — DEXAMETHASONE 4 MG PO TABS
4.0000 mg | ORAL_TABLET | Freq: Two times a day (BID) | ORAL | Status: DC
  Filled 2017-06-07: qty 1

## 2017-06-07 MED ORDER — LORAZEPAM 2 MG/ML PO CONC: 0.5000 mg | ORAL | Status: DC | PRN

## 2017-06-07 MED ORDER — LORAZEPAM 2 MG/ML IJ SOLN: 0.5000 mg | INTRAMUSCULAR | Status: DC | PRN

## 2017-06-07 MED ORDER — LORAZEPAM 0.5 MG PO TABS: 0.5000 mg | ORAL_TABLET | ORAL | Status: DC | PRN

## 2017-06-07 MED ORDER — HALOPERIDOL LACTATE 2 MG/ML PO CONC
0.5000 mg | ORAL | Status: DC | PRN
  Filled 2017-06-07: qty 0.3

## 2017-06-07 MED ORDER — HALOPERIDOL 1 MG PO TABS: 0.5000 mg | ORAL_TABLET | ORAL | Status: DC | PRN

## 2017-06-07 NOTE — Social Work (Signed)
Clinical Social Worker facilitated patient discharge including contacting patient family and facility to confirm patient discharge plans.  Clinical information faxed to facility and family agreeable with plan.    CSW arranged ambulance transport via [facility] to [facility], [address] in [place].    RN to call [phone] to give report prior to discharge.  Clinical Social Worker will sign off for now as social work intervention is no longer needed. Please consult [place] again if new need arises.  Elissa Hefty, LCSW Clinical Social Worker [phone]

## 2017-06-07 NOTE — Progress Notes (Signed)
Nutrition Brief Note  Chart reviewed. Pt likely transitioning to comfort care; follow-up palliative care meeting scheduled today to discuss hospice and comfort measures per palliative care notes. No further nutrition interventions warranted at this time.  Please re-consult as needed.   Bari Handshoe A. Jimmye Norman, RD, LDN, CDE Pager: [phone] After hours Pager: [phone]

## 2017-06-07 NOTE — Discharge Summary (Signed)
Physician Discharge Summary  Nancy Glover  TGG:[id number]  DOB: 04-07-1934  DOA: 06/05/2017 PCP: [name], MD  Admit date: 06/05/2017 Discharge date: 06/07/2017  Admitted From: Home  Disposition:  Inpatient Hospice   Discharge Condition: Prognosis < 3 weeks CODE STATUS: DNR  Diet recommendation: Regular   Brief/Interim Summary: For full details see H&P/Progress Note but in brief, Patient is a 83 year old female with type 2 diabetes, on insulin, PMR on chronic prednisone, hypertension, recently diagnosed glioblastoma in 05/2017, presented with worsening generalized weakness for last 3 days, confusion.  Patient had an outpatient MRI brain on 11/14 to evaluate her memory loss and was found to have multifocal nonenhancing brain tumor concerning for glioblastoma.  Repeat MRI on 11/17 showed primary brain tumor consistent with glioblastoma involving both hemispheres.  Husband reports memory loss and confusion for approximately last 2 months and in the last 2 weeks worsening loss of strength.  Patient admitted for further workup. Neuro oncology and neurosurgery were consulted who recommended no further work up as patient would no benefit nor would tolerate aggressive treatment. Palliative care was consulted, discussed goals of care with family and decision was made to make patient full comfort care.   Subjective: Patient seen and examined, continue to be confused on and off. C/o hip pain. No other concerns.  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 If you have any questions about your discharge medications or the care you received while you were in the hospital after you are discharged, you can call the unit and asked to speak with the hospitalist on call if the hospitalist that took care of you is not available. Once you are discharged, your primary care physician will handle any further medical issues. Please note that NO REFILLS for any discharge medications will be authorized once you are discharged, as it is imperative that you return to your primary care physician (or establish a relationship with a primary care physician if you do not have one) for your aftercare needs so that they can reassess your need for medications and monitor your lab values.  Discharge Instructions    Call MD for:  difficulty breathing, headache or visual disturbances   Complete by:  As directed    Call MD for:  extreme fatigue   Complete by:  As directed    Call MD for:  hives   Complete by:  As directed    Call MD for:  persistant dizziness or light-headedness   Complete by:  As directed    Call MD for:  persistant nausea and vomiting   Complete by:  As directed    Call MD for:  redness, tenderness, or signs of infection (pain, swelling, redness, odor or green/yellow discharge around incision site)   Complete by:  As directed    Call MD for:  severe uncontrolled pain   Complete by:  As directed    Call MD for:  temperature >100.4   Complete by:  As directed    Diet - low sodium heart healthy   Complete by:  As directed      Allergies as of 06/07/2017      Reactions   Actos [pioglitazone] Hives, Swelling   Body swelling   Linagliptin Other (See Comments)   Sulfa Antibiotics Swelling   Tongue and mouth swelling      Medication List    STOP taking these medications   aspirin EC 81 MG tablet   atorvastatin 20 MG tablet Commonly known as:  LIPITOR   CALCIUM + D PO    ferrous sulfate 325 (65 FE) MG tablet   furosemide 20 MG tablet Commonly known as:  LASIX   glucose blood test strip Commonly known as:  ACCU-CHEK AVIVA PLUS   Insulin Pen Needle 31G X 5 MM Misc Commonly known as:  B-D UF III MINI PEN NEEDLES   irbesartan 300 MG tablet Commonly known as:  AVAPRO   metFORMIN 1000 MG tablet Commonly known as:  GLUCOPHAGE   omeprazole 20 MG capsule Commonly known as:  PRILOSEC   potassium chloride 10 MEQ tablet Commonly known as:  K-DUR   predniSONE 1 MG tablet Commonly known as:  DELTASONE   VITAMIN B1-B12 IM     TAKE these medications   acetaminophen 325 MG tablet Commonly known as:  TYLENOL Take 2 tablets (650 mg total) by mouth every 6 (six) hours as needed.   amLODipine 5 MG tablet Commonly known as:  NORVASC Take 10 mg by mouth daily.   carvedilol 12.5 MG tablet Commonly known as:  COREG Take 12.5 mg 2 (two) times daily with a meal by mouth.   dexamethasone 4 MG tablet Commonly known as:  DECADRON Take 1 tablet (4 mg total) by mouth 2 (two) times daily.   Insulin Isophane & Regular Human (70-30) 100 UNIT/ML PEN Commonly known as:  HUMULIN 70/30 KWIKPEN Inject 10 Units into the skin 2 (two) times daily before a meal. What changed:  how much to take   traMADol 50 MG tablet Commonly known as:  ULTRAM Take 1 tablet (50 mg total) by mouth every 6 (six) hours as needed.       Allergies  Allergen Reactions  . Actos [Pioglitazone] Hives and Swelling    Body swelling  . Linagliptin Other (See Comments)  . Sulfa Antibiotics Swelling    Tongue and mouth swelling    Consultations:  Neurosurgery Oncology   Palliative care medicine    Procedures/Studies: Dg Chest 2 View  Result Date: 06/05/2017 CLINICAL DATA:  history of diabetes, glioblastoma of the brain, hypertension hyperlipidemia comes in with chief complaint of generalized weakness.Patient here with her husband. Patient had an MRI 2 days ago which showed  primary GBM. She was supposed to come into the hospital for biopsy and evaluation for further treatment options. However husband reports that patient has progressively gotten weak, and he was unable to get her out of the bed and to the hospital today. Family called neurosurgery, and hospice team and they are advised to come to the ER. EXAM: CHEST  2 VIEW COMPARISON:  05/22/2017 FINDINGS: Cardiac silhouette is mildly enlarged. No mediastinal or hilar masses. No convincing adenopathy. There are prominent bronchovascular markings. Lungs are mildly hyperexpanded. No evidence of pneumonia or pulmonary edema. No pleural effusion or pneumothorax. Skeletal structures are demineralized. IMPRESSION: 1. No acute cardiopulmonary disease. Stable appearance from the recent prior study. Electronically Signed   By: Shanon Brow  Ormond M.D.   On: 06/05/2017 19:09   Dg Chest 2 View  Result Date: 05/22/2017 CLINICAL DATA:  The patient reports not feeling well since beginning insulin last month. The patient reports difficulty breathing this morning associated with shortness of breath. History of hypertension, diabetes, former smoker. EXAM: CHEST  2 VIEW COMPARISON:  Chest x-ray of Nov 24, 2015 FINDINGS: The lungs are adequately inflated. There is no focal infiltrate. There is no pleural effusion. The cardiac silhouette is enlarged. The pulmonary vascularity is normal. There is calcification in the wall of the thoracic aorta. There is multilevel degenerative disc disease of the upper and midthoracic spine. There is old deformity of the posterior aspect of the right eighth rib. IMPRESSION: There is no acute pneumonia. There are mild chronic bronchitic changes which are stable. There is stable cardiomegaly without pulmonary edema. Thoracic aortic atherosclerosis. Electronically Signed   By: David  Martinique M.D.   On: 05/22/2017 12:47   Ct Head W Or Wo Contrast  Result Date: 06/05/2017 CLINICAL DATA:  Brain tumor with altered mental  status. EXAM: CT HEAD WITHOUT AND WITH CONTRAST TECHNIQUE: Contiguous axial images were obtained from the base of the skull through the vertex without and with intravenous contrast CONTRAST:  130mL ISOVUE-300 IOPAMIDOL (ISOVUE-300) INJECTION 61% COMPARISON:  Brain MRI 06/03/2017 FINDINGS: Brain: Severe intracranial tumor burden is again demonstrated. The dominant mass adjacent to the atrium and occipital horn of the right lateral ventricle is unchanged in size, measuring approximately 5.0 x 3.5 cm, though the irregular margins limit the precision of the measurement. Small tumor foci within the anterior right temporal lobe are also unchanged. The dominant tumor component again extends to the midline corpus callosum splenium. Leftward midline shift at 9 mm is unchanged. No hydrocephalus. Vascular: No hyperdense vessel or unexpected calcification. Skull: Normal visualized skull base, calvarium and extracranial soft tissues. Sinuses/Orbits: No sinus fluid levels or advanced mucosal thickening. No mastoid effusion. Normal orbits. IMPRESSION: 1. Allowing for differences in modality, unchanged appearance of posterior right hemispheric glioblastoma with satellite lesions in the anterior right temporal lobe. 2. Unchanged 9 mm leftward midline shift.  No hydrocephalus. Electronically Signed   By: Ulyses Jarred M.D.   On: 06/05/2017 19:04   Mr Jeri Cos WP Contrast  Result Date: 06/03/2017 CLINICAL DATA:  Known brain tumor.  Stereotactic planning. EXAM: MRI HEAD WITHOUT AND WITH CONTRAST TECHNIQUE: Multiplanar, multiecho pulse sequences of the brain and surrounding structures were obtained without and with intravenous contrast. CONTRAST:  5mL MULTIHANCE GADOBENATE DIMEGLUMINE 529 MG/ML IV SOLN COMPARISON:  Brain MRI 3 days ago FINDINGS: Brain: Mass in the right hemisphere that has dense cellularity and necrosis. The bulkiest portion of the mass is centered around the atrium of the right lateral ventricle with infiltration  across the splenium of the corpus callosum into the left periatrial white matter. There is patchy FLAIR hyperintensity in the white matter and cortex of the right cerebral hemisphere with mass effect diffusely, most consistent with gliomatosis. Right-sided sulci are diffusely effaced. Areas of enhancement and most high-grade tumor is present along the superficial and posterior right temporal cortex and around the atrium of the right lateral ventricle. No superimposed infarct or hemorrhage. No hydrocephalus, but the right lateral ventricle has increased in size compared to prior. This deviates the septum pellucidum compared to prior. Elsewhere, midline shift is more mildly progressed, as is sulcal effacement along the right cerebral convexity. This is most likely rapidly progressing tumor as there is no superimposed hemorrhage, infarct, or  focal edema. No obstructive process is seen to explain the change in right lateral ventricular volume. FLAIR hyperintensity in the left cerebral white matter is likely a combination of gliomatosis and chronic small vessel ischemia. Remote lacunar infarct in the left posterior frontal white matter. Small remote left inferior cerebellar infarct. Vascular: Major flow voids and vascular enhancements are preserved. Skull and upper cervical spine: Negative for marrow lesion. Sinuses/Orbits: Right cataract resection.  No acute finding. These results will be called to the ordering clinician or representative by the Radiologist Assistant, and communication documented in the PACS or zVision Dashboard. IMPRESSION: 1. Treatment planning scan re- demonstrates primary brain tumor consistent with glioblastoma involving both hemispheres. There is tumor enhancement and necrosis about the atrium of the right lateral ventricle and scattered in the right temporal lobe. Gliomatosis seen throughout the right cerebral hemisphere. 2. Even though only 3 days since prior, there is progressive generalized  mass effect on the right with further sulcal narrowing and midline displacement. No herniation. 3. The right lateral ventricular volume has increased from 3 days prior, although no obstructive process is seen. No overt hydrocephalus. Electronically Signed   By: Monte Fantasia M.D.   On: 06/03/2017 19:08   Mr Jeri Cos ZO Contrast  Result Date: 05/31/2017 CLINICAL DATA:  83 year old female with confusion, visual problems, difficulty walking, right side numbness, memory loss. Creatinine was obtained on site at [place] at [address] Results: Creatinine 0.7mg /dL. EXAM: MRI HEAD WITHOUT AND WITH CONTRAST TECHNIQUE: Multiplanar, multiecho pulse sequences of the brain and surrounding structures were obtained without and with intravenous contrast. CONTRAST:  11mL MULTIHANCE GADOBENATE DIMEGLUMINE 529 MG/ML IV SOLN COMPARISON:  Head CT without contrast 01/05/2012. FINDINGS: Brain: Extensive abnormal signal in the right cerebral hemisphere with mild to moderate associated mass effect. Centered about the atrium and occipital horn of the right lateral ventricle is a nodular heterogeneously enhancing dominant mass with enhancing component encompassing 66 x 50 x 50 mm (AP by transverse by CC). There is superimposed discontinuous abnormal nodular enhancement continuing into the right temporal lobe as far as the anterior right superior or middle temporal gyrus (series 13, image 64). The dominant lesion demonstrates associated mineralization or blood products in the right occipital periventricular white matter. The abnormal enhancement of the dominant lesion extends to the midline in the splenium of the corpus callosum. The splenium is expanded, and abnormal T2 and FLAIR hyperintensity tracks across midline in the splenium to the left periventricular white matter (series 9, image 13), and this is associated with abnormal diffusion suggesting hyper cellularity (series 4, image 28). Much of the dominant  portion of the mass demonstrates a mix of mildly restricted and facilitated diffusion. Almost all of the mass mass like areas are T2 hyperintense. Additionally there is abnormal masslike T2 and FLAIR hyperintense signal elsewhere in the right hemisphere without associated enhancement, including in the anterior right frontal lobe (series 10, image 15) and also the right parietal lobe (series 10, image 21). Additional bilateral probably chronic small vessel disease related T2 and FLAIR hyperintensity is superimposed such that in areas it is difficult to determine what bilateral hemispheric FLAIR signal might reflect nonenhancing tumor. Widespread T2 heterogeneity in the bilateral deep gray matter chronic small vessel disease and perivascular spaces then tumor. Similar nuclei more resembles T2 heterogeneity in the pons and inferior cerebellum. Overall there is mild to moderate mass effect on the right lateral ventricle with trace leftward midline shift at the central aspect of the hemispheres.  No ventriculomegaly. No extra-axial hemorrhage or fluid collection. Patent basilar cisterns. Negative pituitary and cervicomedullary junction. No dural thickening. Vascular: Major intracranial vascular flow voids are preserved. Skull and upper cervical spine: Normal bone marrow signal. Negative visualized cervical spine. Sinuses/Orbits: Negative aside from postoperative changes to the right globe. Other: Visible internal auditory structures appear normal. Scalp and face soft tissues appear negative. IMPRESSION: 1. Extensive primary brain tumor in the right hemisphere, including multifocal high-grade tumor with enhancement tracking from the anterior middle temporal gyrus into the right occipital, posterior frontal, and parietal lobes. Extension of nonenhancing tumor across midline via the splenium of the corpus callosum, and suspected multifocal nonenhancing tumor elsewhere. 2. Favor Glioblastoma with gliomatosis type brain  involvement. Alternatively, Primary CNS Lymphoma is possible but less likely. 3. Superimposed chronic small vessel disease throughout the brain. 4. Study discussed by telephone with Dr. [name] on 05/31/2017 at 13:19 . Electronically Signed   By: Genevie Ann M.D.   On: 05/31/2017 13:23    Discharge Exam: Vitals:   06/07/17 1048 06/07/17 1347  BP: (!) 160/51 (!) 152/60  Pulse:  61  Resp:  17  Temp:  98.4 F (36.9 C)  SpO2:  94%   Vitals:   06/07/17 0429 06/07/17 0834 06/07/17 1048 06/07/17 1347  BP: (!) 141/65 (!) 151/48 (!) 160/51 (!) 152/60  Pulse: 73 60  61  Resp: 17   17  Temp: 98.3 F (36.8 C)   98.4 F (36.9 C)  TempSrc: Oral   Oral  SpO2: 95%   94%    General: NAD, Confused  Cardiovascular: RRR, S1/S2  Respiratory: CTA bilaterally Abdominal: Soft Extremities: no edema  The results of significant diagnostics from this hospitalization (including imaging, microbiology, ancillary and laboratory) are listed below for reference.     Microbiology: Recent Results (from the past 240 hour(s))  MRSA PCR Screening     Status: Abnormal   Collection Time: 06/06/17  3:41 PM  Result Value Ref Range Status   MRSA by PCR POSITIVE (A) NEGATIVE Final    Comment:        The GeneXpert MRSA Assay (FDA approved for NASAL specimens only), is one component of a comprehensive MRSA colonization surveillance program. It is not intended to diagnose MRSA infection nor to guide or monitor treatment for MRSA infections. RESULT CALLED TO, READ BACK BY AND VERIFIED WITH: RN Trina Ao [id number] MLM      Labs: BNP (last 3 results) No results for input(s): BNP in the last 8760 hours. Basic Metabolic Panel: Recent Labs  Lab 06/05/17 1553 06/05/17 2053 06/06/17 0602 06/07/17 0816  NA 137  --  136 137  K 3.2*  --  3.8 4.1  CL 101  --  107 105  CO2  --   --  18* 23  GLUCOSE 101*  --  192* 339*  BUN 8  --  10 23*  CREATININE 0.60  --  0.70 0.77  CALCIUM  --   --  8.5* 8.9  MG   --  1.7  --   --    Liver Function Tests: Recent Labs  Lab 06/05/17 1531  AST 15  ALT 9*  ALKPHOS 64  BILITOT 1.8*  PROT 5.8*  ALBUMIN 2.9*   No results for input(s): LIPASE, AMYLASE in the last 168 hours. No results for input(s): AMMONIA in the last 168 hours. CBC: Recent Labs  Lab 06/05/17 1531 06/05/17 1553 06/06/17 0602 06/07/17 0816  WBC 8.9  --  4.3 6.1  NEUTROABS 5.4  --   --   --   HGB 14.0 13.6 14.2 13.9  HCT 41.3 40.0 41.8 41.1  MCV 88.1  --  87.3 87.3  PLT 178  --  171 174   Cardiac Enzymes: No results for input(s): CKTOTAL, CKMB, CKMBINDEX, TROPONINI in the last 168 hours. BNP: Invalid input(s): POCBNP CBG: Recent Labs  Lab 06/06/17 1708 06/06/17 2112 06/06/17 2323 06/07/17 0806 06/07/17 1201  GLUCAP 355* 432* 290* 335* 348*   D-Dimer No results for input(s): DDIMER in the last 72 hours. Hgb A1c Recent Labs    06/05/17 2044  HGBA1C 7.5*   Lipid Profile No results for input(s): CHOL, HDL, LDLCALC, TRIG, CHOLHDL, LDLDIRECT in the last 72 hours. Thyroid function studies No results for input(s): TSH, T4TOTAL, T3FREE, THYROIDAB in the last 72 hours.  Invalid input(s): FREET3 Anemia work up No results for input(s): VITAMINB12, FOLATE, FERRITIN, TIBC, IRON, RETICCTPCT in the last 72 hours. Urinalysis    Component Value Date/Time   COLORURINE YELLOW 06/05/2017 2250   APPEARANCEUR CLEAR 06/05/2017 2250   LABSPEC 1.044 (H) 06/05/2017 2250   PHURINE 5.0 06/05/2017 2250   GLUCOSEU NEGATIVE 06/05/2017 2250   GLUCOSEU NEGATIVE 05/24/2017 1358   HGBUR SMALL (A) 06/05/2017 2250   BILIRUBINUR NEGATIVE 06/05/2017 2250   KETONESUR 80 (A) 06/05/2017 2250   PROTEINUR NEGATIVE 06/05/2017 2250   UROBILINOGEN 1.0 05/24/2017 1358   NITRITE POSITIVE (A) 06/05/2017 2250   LEUKOCYTESUR TRACE (A) 06/05/2017 2250   Sepsis Labs Invalid input(s): PROCALCITONIN,  WBC,  LACTICIDVEN Microbiology Recent Results (from the past 240 hour(s))  MRSA PCR Screening      Status: Abnormal   Collection Time: 06/06/17  3:41 PM  Result Value Ref Range Status   MRSA by PCR POSITIVE (A) NEGATIVE Final    Comment:        The GeneXpert MRSA Assay (FDA approved for NASAL specimens only), is one component of a comprehensive MRSA colonization surveillance program. It is not intended to diagnose MRSA infection nor to guide or monitor treatment for MRSA infections. RESULT CALLED TO, READ BACK BY AND VERIFIED WITH: RN Trina Ao [phone] MLM      Time coordinating discharge: 32 minutes  SIGNED:  Chipper Oman, MD  [facility] 06/07/2017, 3:02 PM  Pager please text page via  [email or web address] Password [email or web address]

## 2017-06-07 NOTE — Social Work (Signed)
CSW spoke with [name] at [facility] and made referral.  [place] will faxed clinicals to [facility] for review.  CSW will continue to follow for disposition.  Elissa Hefty, LCSW Clinical Social Worker [phone]

## 2017-06-07 NOTE — Progress Notes (Signed)
Discharge reported called to [place] to [place], discharge instruction, with paperwork, and DNR sent with EMS on transport to the Hospice house with the Patient at discharge. Report was given to EMS, Pt left unit via EMS on stretcher. PIV was removed the Pt.  Pt husband was advised which room the Pt was going to be admitted to.

## 2017-06-07 NOTE — Clinical Social Work Note (Signed)
Clinical Social Work Assessment  Patient Details  Name: Nancy Glover MRN: [id number] Date of Birth: August 01, 1933  Date of referral:  06/07/17               Reason for consult:  End of Life/Hospice                Permission sought to share information with:  Facility Art therapist granted to share information::  Yes, Verbal Permission Granted  Name::     Nancy Glover  Agency::  [facility]  Relationship::  spouse  Contact Information:     Housing/Transportation Living arrangements for the past 2 months:  Single Family Home Source of Information:  Palliative Care Team, Spouse Patient Interpreter Needed:  None Criminal Activity/Legal Involvement Pertinent to Current Situation/Hospitalization:  No - Comment as needed Significant Relationships:  Spouse, Other Family Members Lives with:  Spouse, Relatives, Friends Do you feel safe going back to the place where you live?  No Need for family participation in patient care:  Yes (Comment)  Care giving concerns:  Pt has terminal illness and clinical team is recommending residential hospice. Pt unable to be cared for at home and spouse in agreement with [facility].   Social Worker assessment / plan:  CSW discussed the home hospice vs. Residential and family would prefer residential for patient. CSW obtained permission to send to [facility]. CSW confirmed that hospital liaison will contact spouse to discuss. CSW also discussed the barriers to placement at [facility] and if family would be open to other hospice-[address], [place]. Spouse declined to go to other hospice homes and prefers [facility].  CSW will assist with transition to hospice.  Employment status:  Retired Nurse, adult PT Recommendations:  No Follow Up Information / Referral to community resources:  (Residential Hospice)  Patient/Family's Response to care:  Family  appreciative of CSW assisting with hospice needs during this difficult time. They would prefer [facility]. Family remains hopeful that patient will be able to get a bed. No other issues or concerns identified.  Patient/Family's Understanding of and Emotional Response to Diagnosis, Current Treatment, and Prognosis:  Family has good understanding of diagnosis, current treatment, and prognosis as the palliative discussed referral to residential hospice.  CSW discussed on confirmed the referral and family in agreement with residential hospice. Family prefers [facility] and they remain hopeful that they will get a bed for patient. CSW will continue to follow.  Emotional Assessment Appearance:  Appears stated age Attitude/Demeanor/Rapport:  Unable to Assess Affect (typically observed):  Unable to Assess Orientation:  Oriented to Self, Oriented to Place Alcohol / Substance use:  Not Applicable Psych involvement (Current and /or in the community):  No (Comment)  Discharge Needs  Concerns to be addressed:  Care Coordination Readmission within the last 30 days:  Yes Current discharge risk:  Terminally ill Barriers to Discharge:  No Barriers Identified   Normajean Baxter, LCSW 06/07/2017, 11:41 AM

## 2017-06-07 NOTE — Social Work (Signed)
CSW received call from palliative clinical team advising that they are recommending residential hospice for patient.  CSW will f/u with family.  Elissa Hefty, LCSW Clinical Social Worker [phone]

## 2017-06-07 NOTE — Progress Notes (Addendum)
Daily Progress Note   Patient Name: Nancy Glover       Date: 06/07/2017 DOB: 1933-10-05  Age: 83 y.o. MRN#: [id number] Attending Physician: Patrecia Pour, Christean Grief, MD Primary Care Physician: [name], MD Admit Date: 06/05/2017  Reason for Consultation/Follow-up: Establishing goals of care and Terminal Care  Subjective: Patient awake this morning, confused. Not eating.  Multidisciplinary meeting with spouse, neighbors, and Dr. Mickeal Skinner- discussed patient's poor prognosis and poor candidacy for treatment for very aggressive and very large glioblastoma due to significant decreased functional capacity, size and location of tumor, and comorbidities.  Plan made to transition care to focus on comfort. Referral made for residential hospice. Discussed transition to full comfort care- recommend d/c medications not intended for comfort, change IV antibiotic to oral and continue to treat UTI, no hypoglycemia protocol, change IV decadron to po and will reduce dose as patient having some anxiety and agitation. Will order comfort medications.  Review of Systems  Unable to perform ROS: Acuity of condition    Length of Stay: 2  Current Medications: Scheduled Meds:  . amLODipine  10 mg Oral Daily  . carvedilol  12.5 mg Oral BID WC  . Chlorhexidine Gluconate Cloth  6 each Topical Q0600  . dexamethasone  4 mg Intravenous Q6H  . docusate sodium  100 mg Oral BID  . feeding supplement (ENSURE ENLIVE)  237 mL Oral BID BM  . insulin aspart  0-5 Units Subcutaneous QHS  . insulin aspart  0-9 Units Subcutaneous TID WC  . insulin glargine  5 Units Subcutaneous QHS  . mupirocin ointment  1 application Nasal BID    Continuous Infusions: . sodium chloride    . cefTRIAXone (ROCEPHIN)  IV Stopped (06/07/17  7824)    PRN Meds: acetaminophen, ondansetron **OR** ondansetron (ZOFRAN) IV, polyethylene glycol, traMADol  Physical Exam  Constitutional:  frail  Cardiovascular: Normal rate and regular rhythm.  Neurological:  Confused  Skin: There is pallor.  Nursing note and vitals reviewed.           Vital Signs: BP (!) 151/48   Pulse 60   Temp 98.3 F (36.8 C) (Oral)   Resp 17   SpO2 95%  SpO2: SpO2: 95 % O2 Device: O2 Device: Not Delivered O2 Flow Rate:    Intake/output summary:   Intake/Output Summary (Last  24 hours) at 06/07/2017 1042 Last data filed at 06/07/2017 9935 Gross per 24 hour  Intake 290 ml  Output 1000 ml  Net -710 ml   LBM: Last BM Date: 06/04/17 Baseline Weight:   Most recent weight:         Palliative Assessment/Data: PPS: 20%   Flowsheet Rows     Most Recent Value  Intake Tab  Referral Department  -- [ED]  Palliative Care Primary Diagnosis  Cancer  Date Notified  06/05/17  Palliative Care Type  New Palliative care  Reason for referral  Clarify Goals of Care  Date of Admission  06/04/17  Date first seen by Palliative Care  06/05/17  # of days Palliative referral response time  0 Day(s)  # of days IP prior to Palliative referral  1  Clinical Assessment  Psychosocial & Spiritual Assessment  Palliative Care Outcomes      Patient Active Problem List   Diagnosis Date Noted  . Generalized weakness 06/05/2017  . History of polymyalgia rheumatica 06/05/2017  . Hypokalemia 06/05/2017  . Brain mass   . Palliative care by specialist   . Goals of care, counseling/discussion   . Iron deficiency anemia 07/21/2016  . Sepsis ([place]) 11/24/2015  . Nephrolithiasis 11/24/2015  . Lactic acidosis 11/24/2015  . SOB (shortness of breath) 11/24/2015  . Nausea with vomiting 11/24/2015  . HLD (hyperlipidemia) 11/24/2015  . GERD without esophagitis 11/24/2015  . Status post skin graft-left pretibial region 04/24/2013  . Peripheral vascular disease, unspecified  ([place]) 10/30/2012  . Atherosclerosis of native arteries of the extremities with ulceration(440.23) 10/30/2012  . Vitamin B12 deficiency anemia 09/17/2012  . Hypertension 09/14/2012  . Diabetes mellitus ([place]) 09/14/2012  . Ulcer of right leg ([place]) 09/14/2012  . Anemia 09/14/2012    Palliative Care Assessment & Plan   Patient Profile: 83 y.o. female  with past medical history of DMII, HTN, HLD, and recent finding of brain mass (likely GBM) on MRI presented to the ED on 06/05/17 with complaints of progressive weakness and general decline. She was scheduled to have a biopsy of mass, however, this was canceled due to patient's functional decline- she is now unable to walk, has stopped eating and drinking, has progressing confusion. Palliative medicine consulted for [place].   Assessment/Recommendations/Plan   Continue Decadron as ordered for symptom control  Referral to residential hospice placement  D/C medications not needed for comfort  Will order comfort medications- recommend continuing oral antibiotic for UTI for comfort  Will change dexamethasone to po for comfort and anticipation for discharge- will reduce dose d/t agitation  Goals of Care and Additional Recommendations:  Limitations on Scope of Treatment: Full Comfort Care  Code Status:  DNR  Prognosis:   < 2 weeks d/t aggressive large glioblastoma w/ midline shift, no plans for further treatment, transition to full comfort measures  Discharge Planning:  Hospice facility  Care plan was discussed with patient's family.  Thank you for allowing the Palliative Medicine Team to assist in the care of this patient.   Time In: 1000 Time Out: 1055 Total Time 55 mins Prolonged Time Billed Yes      Greater than 50%  of this time was spent counseling and coordinating care related to the above assessment and plan.  Mariana Kaufman, AGNP-C Palliative Medicine   Please contact Palliative Medicine Team phone at [phone] for  questions and concerns.

## 2017-06-07 NOTE — Progress Notes (Signed)
Inpatient Diabetes Program Recommendations  AACE/ADA: New Consensus Statement on Inpatient Glycemic Control (2015)  Target Ranges:  Prepandial:   less than 140 mg/dL      Peak postprandial:   less than 180 mg/dL (1-2 hours)      Critically ill patients:  140 - 180 mg/dL    Results for ILAH, BOULE (MRN [id number]) as of 06/07/2017 09:14  Ref. Range 06/06/2017 07:31 06/06/2017 12:03 06/06/2017 17:08 06/06/2017 21:12  Glucose-Capillary Latest Ref Range: 65 - 99 mg/dL 185 (H) 269 (H) 355 (H) 432 (H)   Results for SALIAH, CRISP (MRN [id number]) as of 06/07/2017 09:14  Ref. Range 06/07/2017 08:06  Glucose-Capillary Latest Ref Range: 65 - 99 mg/dL 335 (H)    Home DM Meds: 70/30 Insulin- 8 units BID       Metformin 1000 mg BID  Current Insulin Orders: Lantus 5 units QHS        Novolog Sensitive Correction Scale/ SSI (0-9 units) TID AC + HS       MD- Note Palliative Care Team now involved with care.  Note that patient receiving Decadron 4 mg Q6 hours.  CBGs quite elevated likely due to steroids.  If within goals of care, please consider the following:  1. Increase Lantus to 13 units QHS (0.2 units/kg)   2. Increase Novolog SSI to Resistant scale (0-20 units) TID AC + HS      --Will follow patient during hospitalization--  Wyn Quaker RN, MSN, CDE Diabetes Coordinator Inpatient Glycemic Control Team Team Pager: [phone] (8a-5p)

## 2017-06-07 NOTE — Social Work (Signed)
CSW was advised that patient has been accepted to [facility]. Kathy,hospital liaison will f/u with RN on floor for specific information needed in dc summary.  CSW will f/u for disposition to [facility].  Elissa Hefty, LCSW Clinical Social Worker [phone]

## 2017-06-10 LAB — URINE CULTURE: Culture: >=100000 — AB

## 2017-06-13 NOTE — Telephone Encounter (Signed)
Nancy Glover,patient ask you to give him a call when you have a chance it is very important.

## 2017-06-13 NOTE — Telephone Encounter (Signed)
Called patient and left a voice message and I will try to call them tomorrow.

## 2017-06-14 NOTE — Telephone Encounter (Signed)
Called patient and left a voice message

## 2017-06-15 NOTE — Telephone Encounter (Signed)
Patient husband ask you to call he has question about medication.

## 2017-06-20 NOTE — Telephone Encounter (Signed)
Called patient and spoke to her husband. He stated that she is in Hospice due to the size of the brain tumor.

## 2017-07-05 ENCOUNTER — Telehealth: Payer: Self-pay

## 2017-07-05 ENCOUNTER — Ambulatory Visit: Payer: Medicare Other | Admitting: Endocrinology

## 2017-07-05 NOTE — Telephone Encounter (Signed)
Called patient and spoke to her husband and he stated that Nylee passed away on 2017/07/17.

## 2017-07-18 DEATH — deceased

## 2017-07-19 ENCOUNTER — Encounter: Payer: Self-pay | Admitting: Radiation Therapy

## 2017-08-05 ENCOUNTER — Other Ambulatory Visit: Payer: Self-pay | Admitting: Nurse Practitioner

## 2018-07-12 IMAGING — CT CT HEAD WO/W CM
4 of 5 series · 15 of 47 positions shown, 17 images · IV contrast (iopamidol)
Comparison: Brain MRI 06/03/2017

CLINICAL DATA: Brain tumor with altered mental status.

EXAM:
CT HEAD WITHOUT AND WITH CONTRAST
TECHNIQUE: Contiguous axial images were obtained from the base of the skull
through the vertex without and with intravenous contrast
CONTRAST:  100mL [id number] IOPAMIDOL ([id number]) INJECTION 61%

[Series 3: head without without · axial · non-contrast · 0.46mm/px · z∈[-100,-10]mm · 4 of 32 slices shown]
[im 7/32  brain]
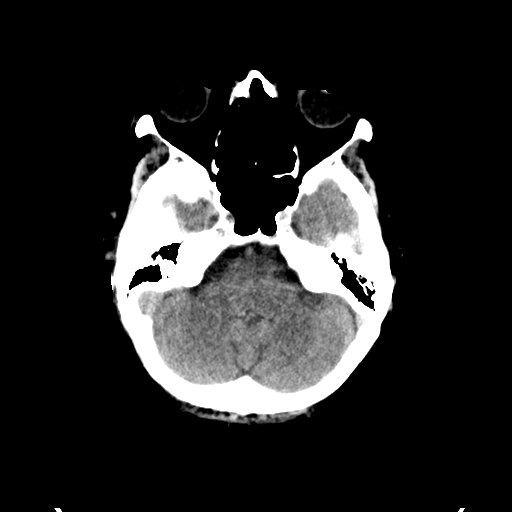
[im 13/32  brain]
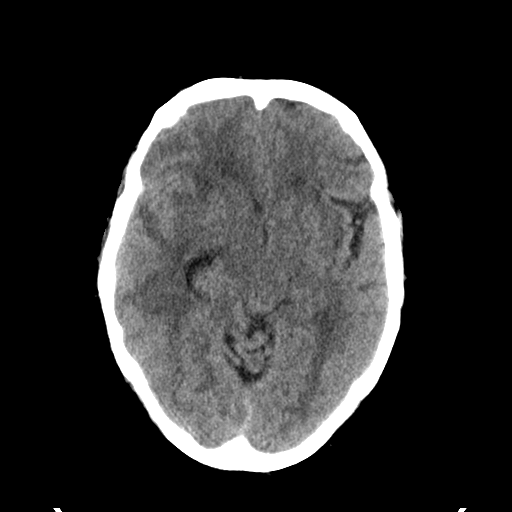
[im 19/32  brain]
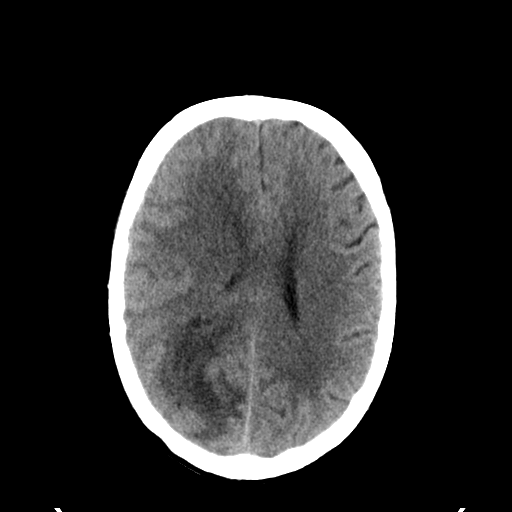
[im 25/32  brain]
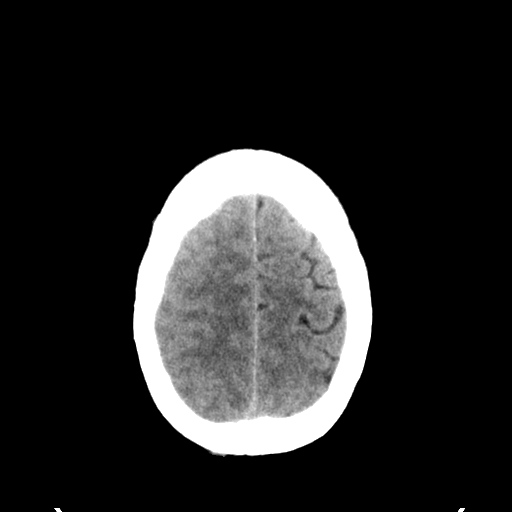

[Series 5: head with · axial · 0.43mm/px · z∈[-110,-10]mm · 5 of 32 slices shown, 7 images]
[im 6/32  brain]
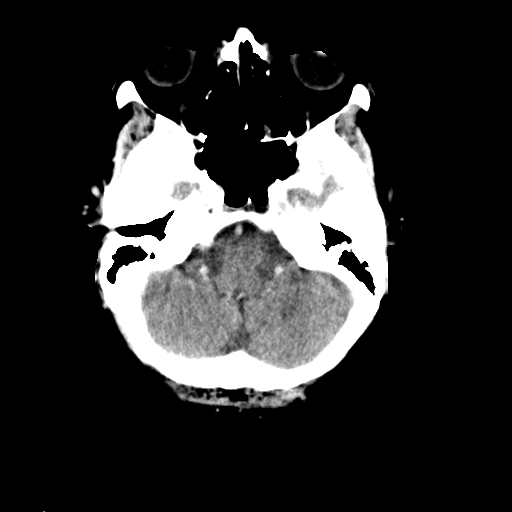
[im 6/32  bone]
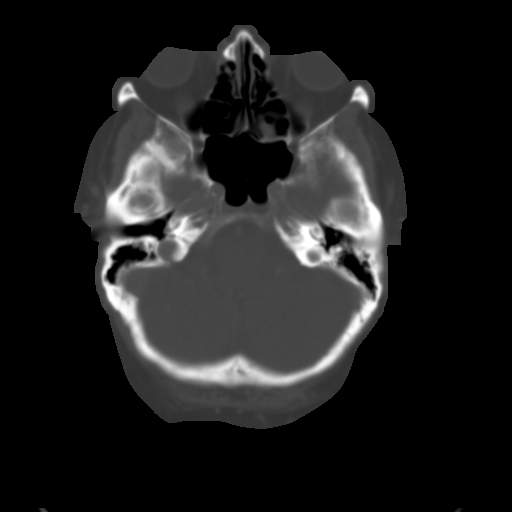
[im 11/32  brain]
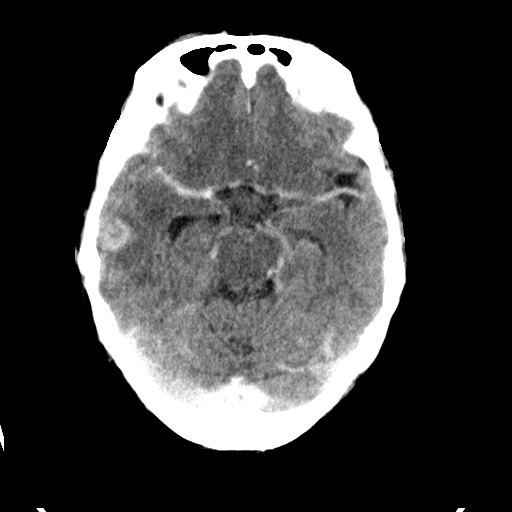
[im 16/32  brain]
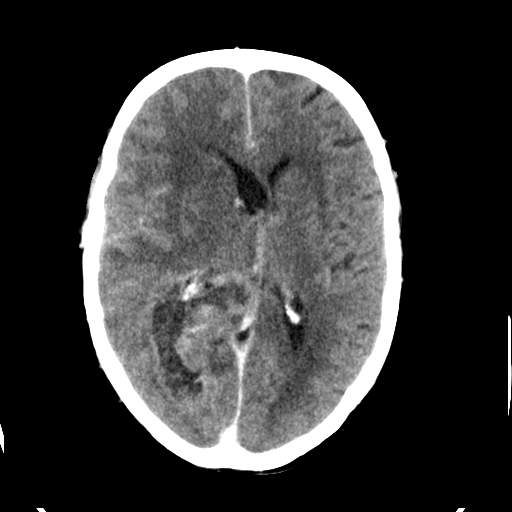
[im 21/32  brain]
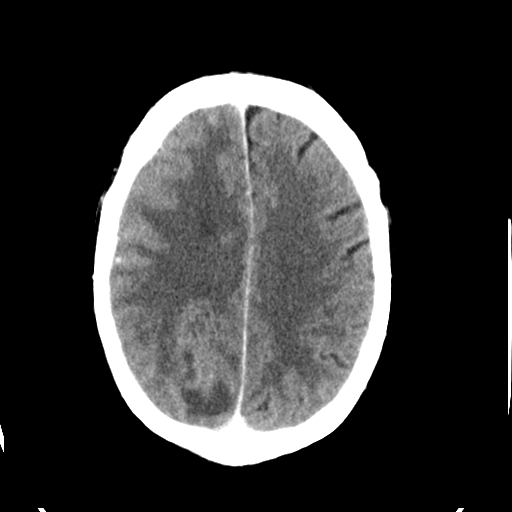
[im 26/32  brain]
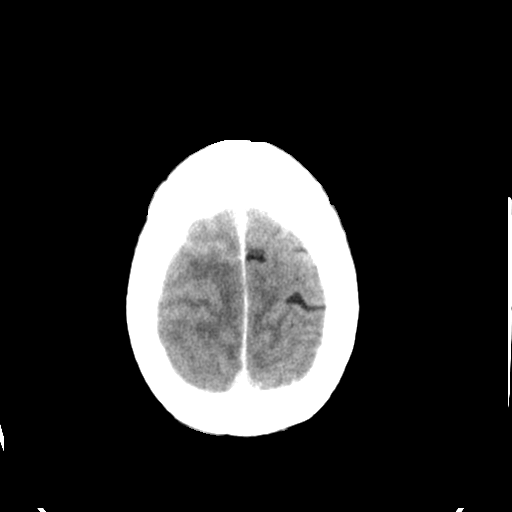
[im 26/32  bone]
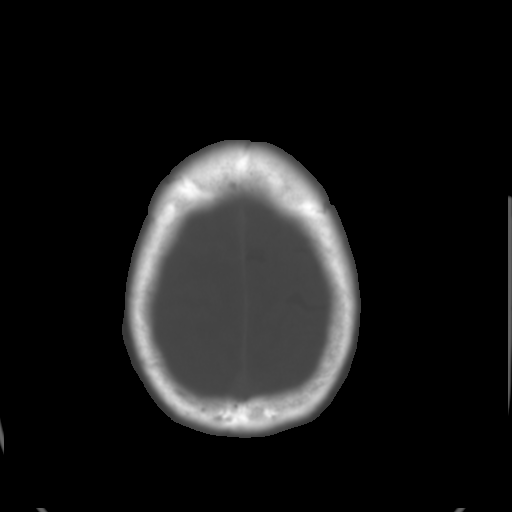

[Series 6: head with cor · coronal · 0.31mm/px · 3 of 67 slices shown]
[im 23/67  brain]
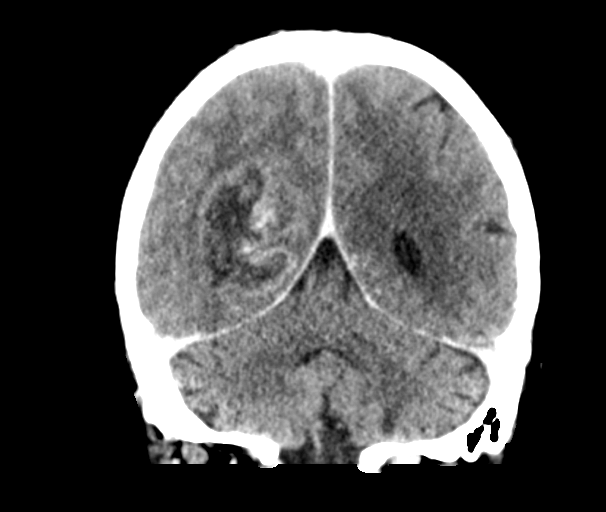
[im 30/67  brain]
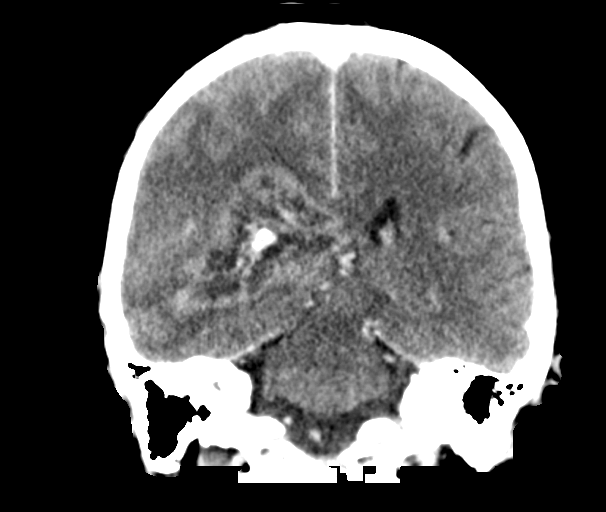
[im 37/67  brain]
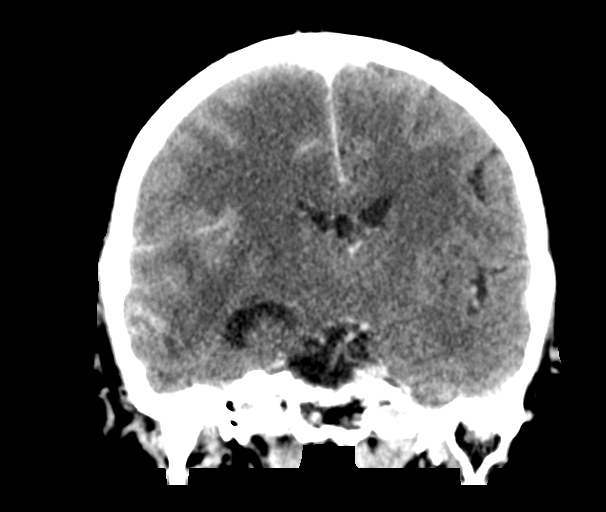

[Series 7: head with sag · sagittal · 0.28mm/px · 3 of 58 slices shown]
[im 20/58  brain]
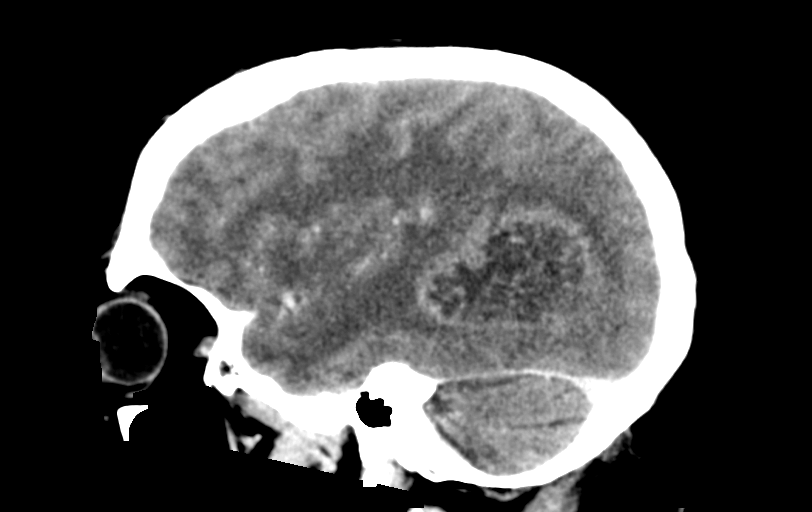
[im 29/58  brain]
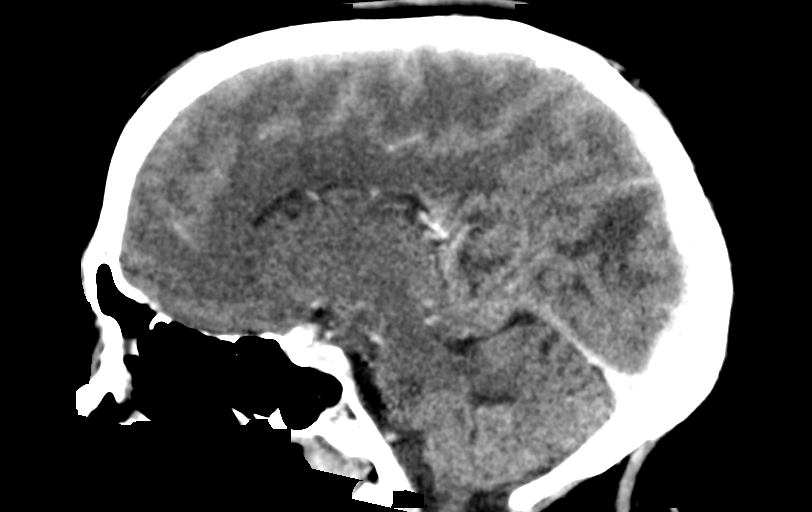
[im 39/58  brain]
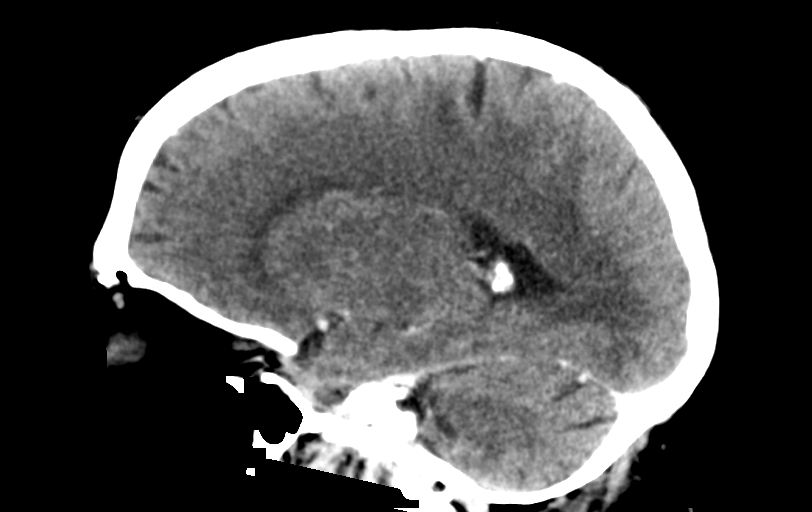

[15 of 47 positions shown; findings below may reference images not displayed]

FINDINGS: Brain: Severe intracranial tumor burden is again demonstrated. The
dominant mass adjacent to the atrium and occipital horn of the right
lateral ventricle is unchanged in size, measuring approximately
x 3.5 cm, though the irregular margins limit the precision of the
measurement. Small tumor foci within the anterior right temporal
lobe are also unchanged. The dominant tumor component again extends
to the midline corpus callosum splenium. Leftward midline shift at 9
mm is unchanged. No hydrocephalus.

Vascular: No hyperdense vessel or unexpected calcification.

Skull: Normal visualized skull base, calvarium and extracranial soft
tissues.

Sinuses/Orbits: No sinus fluid levels or advanced mucosal
thickening. No mastoid effusion. Normal orbits.
IMPRESSION: 1. Allowing for differences in modality, unchanged appearance of
posterior right hemispheric glioblastoma with satellite lesions in
the anterior right temporal lobe.
2. Unchanged 9 mm leftward midline shift.  No hydrocephalus.

## 2018-07-12 IMAGING — CR DG CHEST 2V
2 series · 2 of 2 positions shown · non-contrast
Comparison: 05/22/2017

CLINICAL DATA: history of diabetes, glioblastoma of the brain,
hypertension hyperlipidemia comes in with chief complaint of
generalized weakness.Patient here with her husband. Patient had an
MRI 2 days ago which showed primary GBM. She was supposed to come
into the hospital for biopsy and evaluation for further treatment
options. However husband reports that patient has progressively
gotten weak, and he was unable to get her out of the bed and to the
hospital today. Family called neurosurgery, and hospice team and
they are advised to come to the ER.

EXAM:
CHEST  2 VIEW

[chest lat]
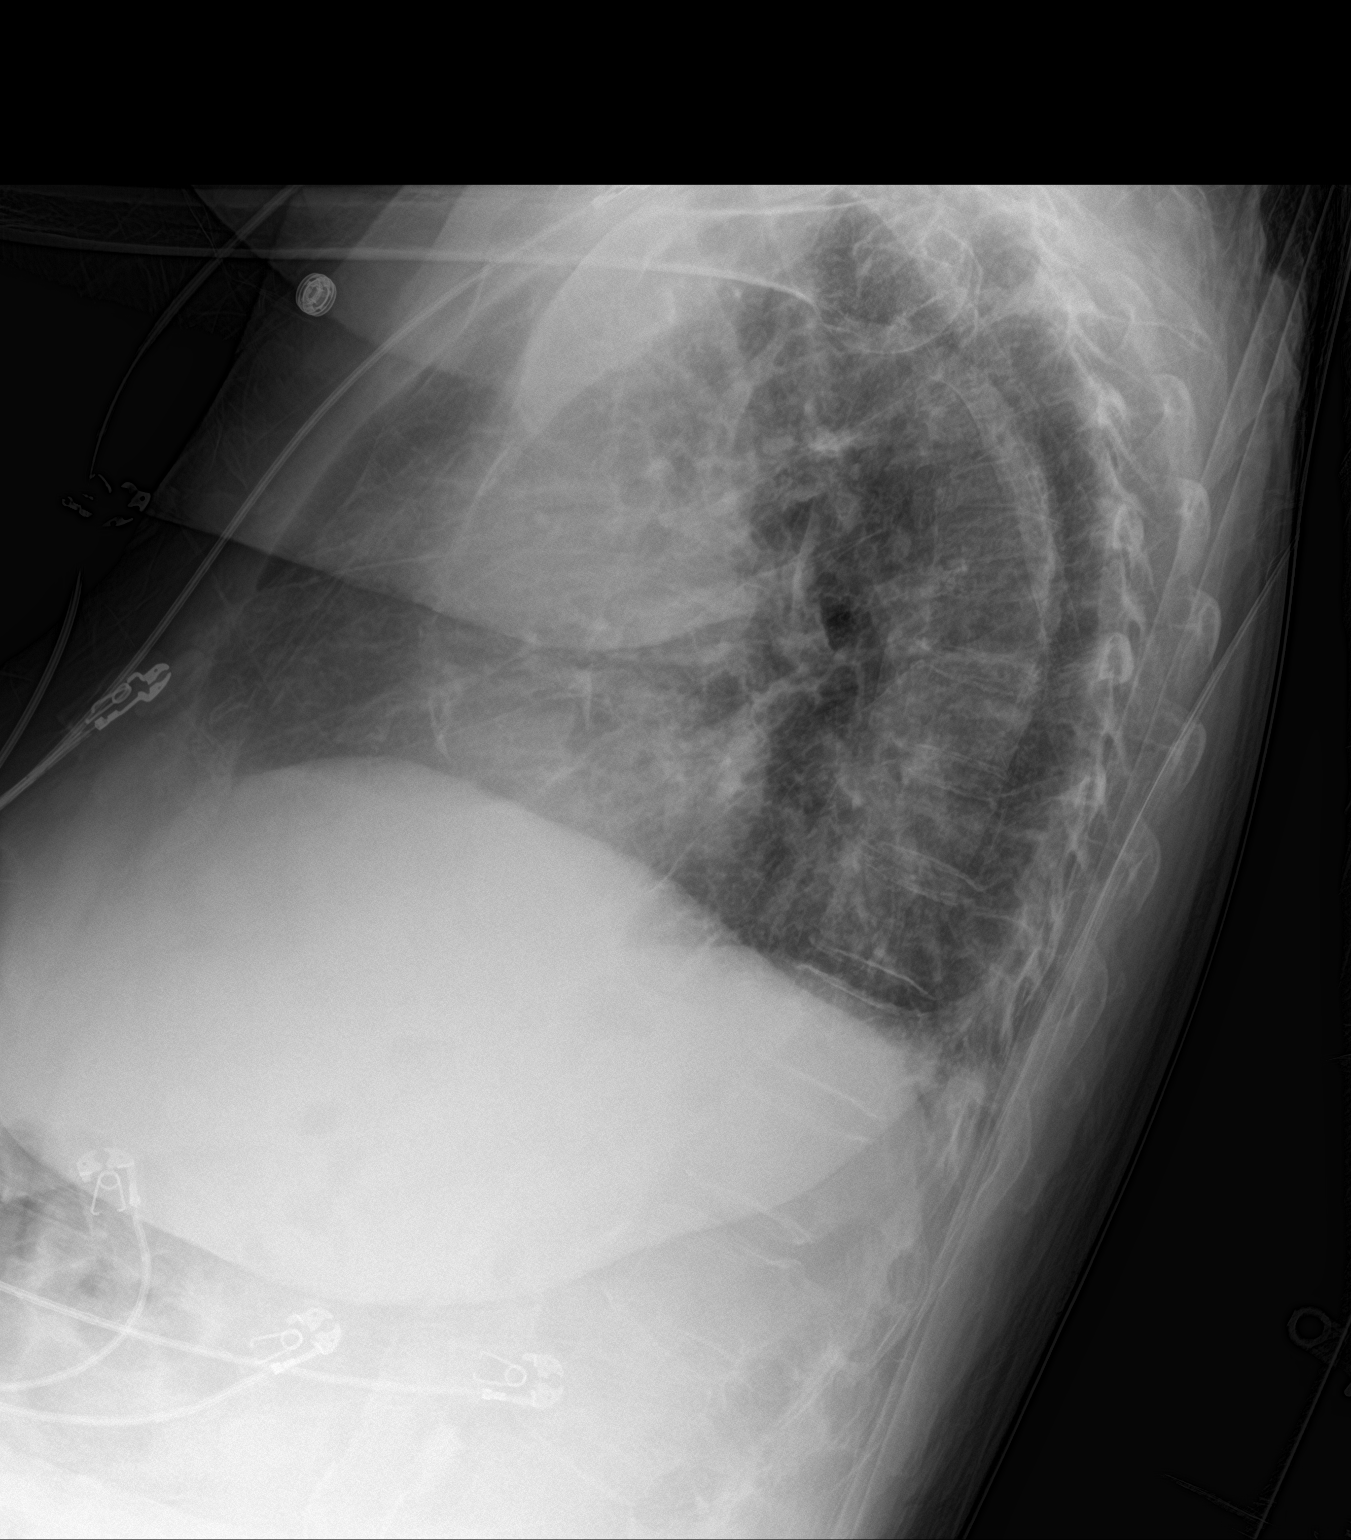

[chest ap]
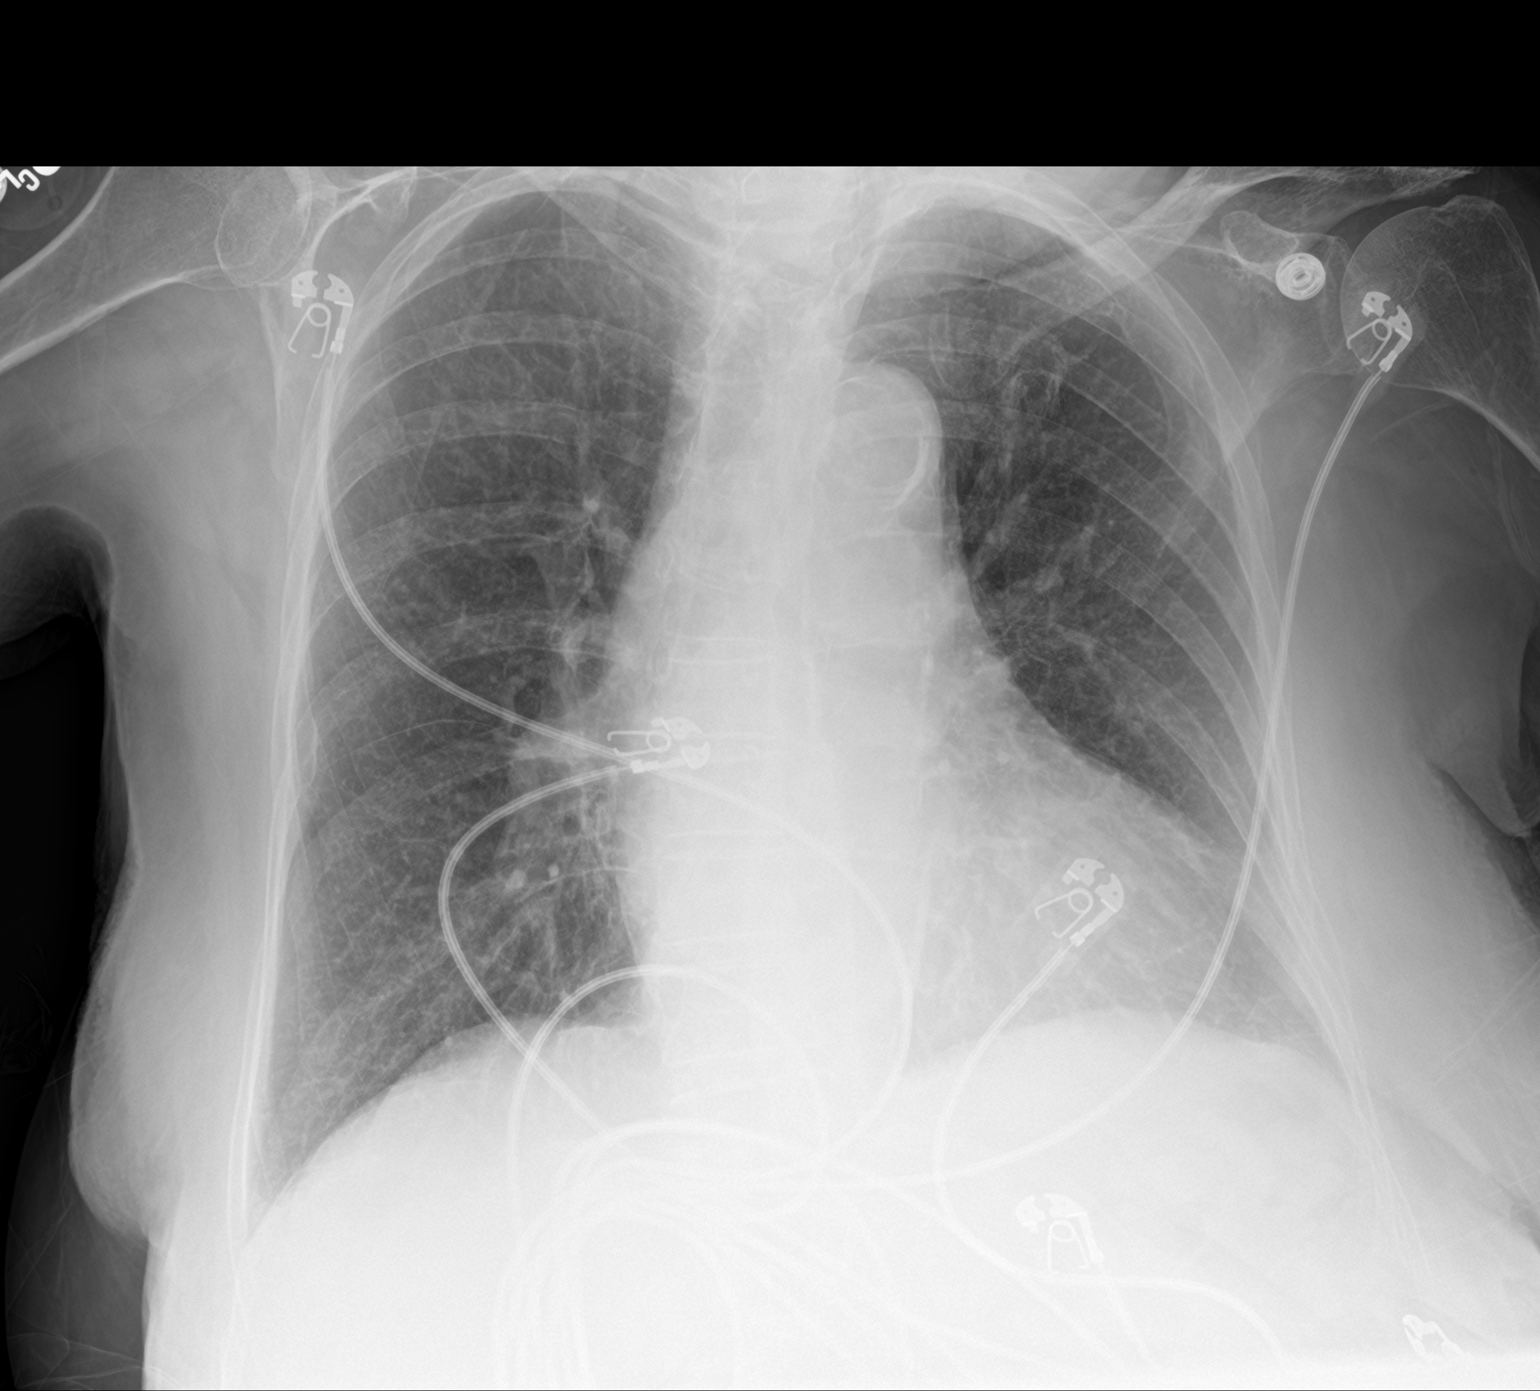

[2 of 2 positions shown; findings below may reference images not displayed]

FINDINGS: Cardiac silhouette is mildly enlarged. No mediastinal or hilar
masses. No convincing adenopathy.

There are prominent bronchovascular markings. Lungs are mildly
hyperexpanded. No evidence of pneumonia or pulmonary edema.

No pleural effusion or pneumothorax.

Skeletal structures are demineralized.
IMPRESSION: 1. No acute cardiopulmonary disease. Stable appearance from the
recent prior study.
